# Patient Record
Sex: Male | Born: 1945 | Race: White | Hispanic: No | Marital: Married | Smoking: Never smoker
Health system: Southern US, Community
[De-identification: ages and names within clinical notes are randomized; demographics above are authoritative.]

## PROBLEM LIST (undated history)

## (undated) DIAGNOSIS — Z9889 Other specified postprocedural states: Secondary | ICD-10-CM

## (undated) DIAGNOSIS — F32A Depression, unspecified: Secondary | ICD-10-CM

## (undated) DIAGNOSIS — H8110 Benign paroxysmal vertigo, unspecified ear: Secondary | ICD-10-CM

## (undated) DIAGNOSIS — K219 Gastro-esophageal reflux disease without esophagitis: Secondary | ICD-10-CM

## (undated) DIAGNOSIS — G709 Myoneural disorder, unspecified: Secondary | ICD-10-CM

## (undated) DIAGNOSIS — M1990 Unspecified osteoarthritis, unspecified site: Secondary | ICD-10-CM

## (undated) DIAGNOSIS — F410 Panic disorder [episodic paroxysmal anxiety] without agoraphobia: Secondary | ICD-10-CM

## (undated) DIAGNOSIS — F419 Anxiety disorder, unspecified: Secondary | ICD-10-CM

## (undated) DIAGNOSIS — F329 Major depressive disorder, single episode, unspecified: Secondary | ICD-10-CM

## (undated) DIAGNOSIS — R112 Nausea with vomiting, unspecified: Secondary | ICD-10-CM

## (undated) HISTORY — DX: Anxiety disorder, unspecified: F41.9

## (undated) HISTORY — PX: VASECTOMY: SHX75

## (undated) HISTORY — DX: Benign paroxysmal vertigo, unspecified ear: H81.10

## (undated) HISTORY — PX: COLONOSCOPY: SHX174

## (undated) HISTORY — DX: Panic disorder (episodic paroxysmal anxiety): F41.0

## (undated) HISTORY — DX: Unspecified osteoarthritis, unspecified site: M19.90

## (undated) HISTORY — PX: KNEE SURGERY: SHX244

---

## 1967-12-27 HISTORY — PX: APPENDECTOMY: SHX54

## 1983-12-27 HISTORY — PX: OTHER SURGICAL HISTORY: SHX169

## 1988-12-26 HISTORY — PX: OTHER SURGICAL HISTORY: SHX169

## 1991-12-27 HISTORY — PX: ANAL FISSURE REPAIR: SHX2312

## 2007-12-27 HISTORY — PX: BACK SURGERY: SHX140

## 2009-12-26 HISTORY — PX: BACK SURGERY: SHX140

## 2009-12-26 HISTORY — PX: TOTAL KNEE ARTHROPLASTY: SHX125

## 2010-12-26 HISTORY — PX: OTHER SURGICAL HISTORY: SHX169

## 2011-01-26 HISTORY — PX: TOTAL KNEE ARTHROPLASTY: SHX125

## 2012-10-24 ENCOUNTER — Other Ambulatory Visit: Payer: Self-pay | Admitting: Orthopedic Surgery

## 2012-10-24 DIAGNOSIS — M25511 Pain in right shoulder: Secondary | ICD-10-CM

## 2012-10-24 DIAGNOSIS — R531 Weakness: Secondary | ICD-10-CM

## 2012-10-31 ENCOUNTER — Ambulatory Visit
Admission: RE | Admit: 2012-10-31 | Discharge: 2012-10-31 | Disposition: A | Discharge status: Home / Self Care | Payer: Medicare Other | Source: Ambulatory Visit | Attending: Orthopedic Surgery | Admitting: Orthopedic Surgery

## 2012-10-31 DIAGNOSIS — R531 Weakness: Secondary | ICD-10-CM

## 2012-10-31 DIAGNOSIS — M25511 Pain in right shoulder: Secondary | ICD-10-CM

## 2012-11-29 ENCOUNTER — Other Ambulatory Visit: Payer: Self-pay | Admitting: Orthopedic Surgery

## 2012-11-29 DIAGNOSIS — M542 Cervicalgia: Secondary | ICD-10-CM

## 2012-11-29 DIAGNOSIS — M25511 Pain in right shoulder: Secondary | ICD-10-CM

## 2012-12-03 ENCOUNTER — Ambulatory Visit
Admission: RE | Admit: 2012-12-03 | Discharge: 2012-12-03 | Disposition: A | Discharge status: Home / Self Care | Payer: Medicare Other | Source: Ambulatory Visit | Attending: Orthopedic Surgery | Admitting: Orthopedic Surgery

## 2012-12-03 DIAGNOSIS — M542 Cervicalgia: Secondary | ICD-10-CM

## 2012-12-03 DIAGNOSIS — M25511 Pain in right shoulder: Secondary | ICD-10-CM

## 2012-12-18 ENCOUNTER — Encounter: Payer: Self-pay | Admitting: Sports Medicine

## 2012-12-18 ENCOUNTER — Ambulatory Visit (INDEPENDENT_AMBULATORY_CARE_PROVIDER_SITE_OTHER): Payer: Medicare Other | Admitting: Sports Medicine

## 2012-12-18 VITALS — BP 134/86 | HR 89 | Ht 68.0 in | Wt 220.0 lb

## 2012-12-18 DIAGNOSIS — M79609 Pain in unspecified limb: Secondary | ICD-10-CM

## 2012-12-18 DIAGNOSIS — M79671 Pain in right foot: Secondary | ICD-10-CM | POA: Insufficient documentation

## 2012-12-18 NOTE — Assessment & Plan Note (Signed)
See instructions  HEP and stretches Arch strap Heel cup Cont gel lifts  Try these for 6 to 8 weeks and let's recheck at that time  Lots of other orthopedic issues contribute

## 2012-12-18 NOTE — Progress Notes (Signed)
Patient ID: George Freeman, male   DOB: 02-29-1946, 66 y.o.   MRN: [id number]  Rt heel pain x 6 months, worse x 2 months.  Worst in the mornings, and with increased activity. Has not been able to do exercise for exercise due to heel pain.  Has padded rt shoes with OTC orthotic and heel pad Not a clear trigger for this pain He used to like to walk and hike with wife Unable to do this for past 6 mos.  Hx of multiple orthopedic surgeries- bilat shoulders, bilat knee replacements, lt foot, back TKR have worked well Current sxs from cervical spine to RT arm Also some numbness to left foot   Physical exam: Mildly obese W male in NAD  Hallux rigidus rt great toe- 15 deg extension, 10 deg flexion Mild hallus rigidus lt great toe- 30 deg extension ttp of heel at insertion of PF Midline scar from biklat knees from replacements Rt leg- 85 cm  Lt leg 87 cm  Walking gait- back shifting -Thoracic scoliosis curve Walks with torso shifted to RT but this evens his leg length functionally  MSK [place] RT PF is 0.56 vs LT PF of 0.47 There is some hypoechoic change over calcaneal fat pad on RT Mild calcification over os calcis No PF tears   Rt Ankle: No visible erythema or swelling. Range of motion is full in all directions. Strength is 5/5 in all directions. Stable lateral and medial ligaments; squeeze test and kleiger test unremarkable; Talar dome nontender; No pain at base of 5th MT; No tenderness over cuboid; No tenderness over N spot or navicular prominence No tenderness on posterior aspects of lateral and medial malleolus No sign of peroneal tendon subluxations; Negative tarsal tunnel tinel's Able to walk 4 steps.

## 2013-01-26 HISTORY — PX: CERVICAL SPINE SURGERY: SHX589

## 2013-02-12 ENCOUNTER — Ambulatory Visit (INDEPENDENT_AMBULATORY_CARE_PROVIDER_SITE_OTHER): Payer: Medicare HMO | Admitting: Sports Medicine

## 2013-02-12 VITALS — BP 123/80 | Ht 68.0 in | Wt 220.0 lb

## 2013-02-12 DIAGNOSIS — M79671 Pain in right foot: Secondary | ICD-10-CM

## 2013-02-12 DIAGNOSIS — M79609 Pain in unspecified limb: Secondary | ICD-10-CM

## 2013-02-12 DIAGNOSIS — F4001 Agoraphobia with panic disorder: Secondary | ICD-10-CM

## 2013-02-12 MED ORDER — SIMVASTATIN 40 MG PO TABS: 40.0000 mg | ORAL_TABLET | Freq: Every evening | ORAL | Status: AC

## 2013-02-12 MED ORDER — ALPRAZOLAM 0.25 MG PO TABS: 0.2500 mg | ORAL_TABLET | Freq: Four times a day (QID) | ORAL | Status: AC

## 2013-02-12 MED ORDER — FLUVOXAMINE MALEATE 100 MG PO TABS: 100.0000 mg | ORAL_TABLET | Freq: Every day | ORAL | Status: AC

## 2013-02-12 NOTE — Assessment & Plan Note (Signed)
This is significantly improved I think the heel cups of help because a lot of his trauma was over the os calcis Continue the plantar fashion stretches those seem to be helping great deal His walking is improved as he is no longer walking on the outside of his foot and that has also lessens his stress on the plantar fascia  Will recheck this when necessary or if it's not improved within 3 months

## 2013-02-12 NOTE — Progress Notes (Signed)
Patient ID: Clemens Lachman, male   DOB: 1946-01-13, 67 y.o.   MRN: [id number]  Patient who is back for f/u of plantar fasciitis and RT heel pain He did the exercises, used the heel cups He added 2 more stretches He notes heel pain is 75% better  Surgery on neck 5 days ago Having to use Hydrocodone for that but had gotten down to 1 a day presurgery  Multiple orthopedic surgeries and knee replacements (12 total) - see last hx  Panic disorder He takes Xanax 0.25 mg 4 times a day He uses Luvox 100 daily Other medication is simvastatin 40 mg daily He has been using hydrocodone to recover from orthopedic surgery  Physical examination Patient seems in some mild pain primarily on movement of the back he has a healing surgical scar just below the neck on the right side  Examination of the right foot shows no tenderness now over the heel He still has a significant limitation of motion of the right great toe His longitudinal arch is slightly flattened No swelling or other abnormality

## 2013-02-12 NOTE — Assessment & Plan Note (Signed)
We discussed that this is related a lot of the time to symptoms around his musculoskeletal system  I advised him to start his exercise program very gradually He should expect to have some pain over some joint disease had bilateral knee replacements as well as a lot of other surgical procedures  Use cognitive behavioral therapy to lessen the degree of anxiety related to these

## 2013-11-12 ENCOUNTER — Encounter (HOSPITAL_COMMUNITY): Payer: Medicare HMO

## 2013-11-20 ENCOUNTER — Other Ambulatory Visit (HOSPITAL_COMMUNITY): Payer: Self-pay | Admitting: Family Medicine

## 2013-11-20 ENCOUNTER — Ambulatory Visit (HOSPITAL_COMMUNITY)
Admission: RE | Admit: 2013-11-20 | Discharge: 2013-11-20 | Disposition: A | Discharge status: Home / Self Care | Payer: Medicare HMO | Source: Ambulatory Visit | Attending: Family Medicine | Admitting: Family Medicine

## 2013-11-20 DIAGNOSIS — G609 Hereditary and idiopathic neuropathy, unspecified: Secondary | ICD-10-CM | POA: Insufficient documentation

## 2013-11-20 DIAGNOSIS — R209 Unspecified disturbances of skin sensation: Secondary | ICD-10-CM | POA: Insufficient documentation

## 2013-11-20 NOTE — Progress Notes (Signed)
VASCULAR LAB PRELIMINARY  ABI completed:    RIGHT    LEFT    PRESSURE WAVEFORM  PRESSURE WAVEFORM  BRACHIAL 137 Tri BRACHIAL 123 Tri  DP   DP    AT 151 Tri AT 149 Tri  PT 152 Tri PT 154 Tri  PER   PER    GREAT TOE  NA GREAT TOE  NA    RIGHT LEFT  ABI 1.11 1.12     Farrel Demark, RDMS, RVT  11/20/2013, 10:30 AM

## 2013-11-25 HISTORY — PX: BACK SURGERY: SHX140

## 2014-09-23 ENCOUNTER — Other Ambulatory Visit: Payer: Self-pay | Admitting: Orthopedic Surgery

## 2014-09-23 DIAGNOSIS — M542 Cervicalgia: Secondary | ICD-10-CM

## 2014-09-26 ENCOUNTER — Ambulatory Visit
Admission: RE | Admit: 2014-09-26 | Discharge: 2014-09-26 | Disposition: A | Discharge status: Home / Self Care | Payer: Medicare HMO | Source: Ambulatory Visit | Attending: Orthopedic Surgery | Admitting: Orthopedic Surgery

## 2014-09-26 DIAGNOSIS — M542 Cervicalgia: Secondary | ICD-10-CM

## 2014-09-27 ENCOUNTER — Other Ambulatory Visit: Payer: Medicare HMO

## 2015-03-23 ENCOUNTER — Ambulatory Visit: Payer: Medicare HMO | Admitting: Neurology

## 2015-03-24 ENCOUNTER — Ambulatory Visit (INDEPENDENT_AMBULATORY_CARE_PROVIDER_SITE_OTHER): Payer: Medicare PPO | Admitting: Neurology

## 2015-03-24 ENCOUNTER — Encounter: Payer: Self-pay | Admitting: Neurology

## 2015-03-24 VITALS — BP 119/72 | HR 85 | Temp 97.4°F | Ht 68.0 in | Wt 208.5 lb

## 2015-03-24 DIAGNOSIS — R5382 Chronic fatigue, unspecified: Secondary | ICD-10-CM

## 2015-03-24 DIAGNOSIS — G609 Hereditary and idiopathic neuropathy, unspecified: Secondary | ICD-10-CM

## 2015-03-24 DIAGNOSIS — E539 Vitamin B deficiency, unspecified: Secondary | ICD-10-CM

## 2015-03-24 DIAGNOSIS — R7302 Impaired glucose tolerance (oral): Secondary | ICD-10-CM

## 2015-03-24 NOTE — Progress Notes (Signed)
[facility]    Provider:  Dr Lucia Gaskins Referring Provider: Tracey Harries, MD Primary Care Physician:  Aura Dials, MD  CC:  neuropathy  HPI:  George Freeman is a 69 y.o. male here as a referral from Dr. Everlene Other for neuropathy  Symptoms started 5 years ago. Both feet symmetric, numbness, get cold at night. Cramps at night in the toes if he moves his toes. Feels like something is on the bottom of the feet when walking. Worse at night in bed. He can't feel them. No pain. He had vascular testing and normal. Always there, nothing makes it better. No pain at all. Slowly progressive. Symptoms in the anterior part of the feet. He has a tendency to drop things, no sensory changes in the hands. But hands tend to be colder. No weakness.   Review of Systems: Patient complains of symptoms per HPI as well as the following symptoms: anxiety, restless legs. Pertinent negatives per HPI. All others negative.   History   Social History  . Marital Status: Married    Spouse Name: N/A  . Number of Children: 2  . Years of Education: PHD   Occupational History  . Retired    Social History Main Topics  . Smoking status: Never Smoker   . Smokeless tobacco: Not on file  . Alcohol Use: No  . Drug Use: No  . Sexual Activity: Not on file   Other Topics Concern  . Not on file   Social History Narrative   Lives at home with wife.    Caffeine use: very little     Family History  Problem Relation Age of Onset  . Diabetes Mother   . Alzheimer's disease Sister   . Acute myelogenous leukemia    . Neuropathy Neg Hx     Past Medical History  Diagnosis Date  . BPV (benign positional vertigo)   . Panic disorder   . Arthritis   . Anxiety     Past Surgical History  Procedure Laterality Date  . Appendectomy  1969  . Knee surgery      2x- cartilage  . Total knee arthroplasty  January 2011  . Total knee arthroplasty  Feb. 2012  . Vasectomy    . Anal fissure repair  1993  .  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 Back surgery  2011    Stenosis, sciatica  . Left shoulder surgery  1990  . Right shoulder surgery  2012  . Left foot surgery  1985  . Cervical spine surgery  Feb. 2014  . Back surgery  December 2014    Current Outpatient Prescriptions  Medication Sig Dispense Refill  . fluvoxaMINE (LUVOX) 100 MG tablet Take 1 tablet (100 mg total) by mouth at bedtime. (Patient taking differently: Take 150 mg by mouth at bedtime. )  3  . HYDROcodone-acetaminophen (NORCO) 10-325 MG per tablet Take 1 tablet by mouth as needed.    Marland Kitchen ibuprofen (ADVIL,MOTRIN) 600 MG tablet Take 600 mg by mouth.    Marland Kitchen omeprazole (PRILOSEC) 20 MG capsule Take 20 mg by mouth 2 (two) times daily.    . Probiotic Product (ALIGN PO) Take 3 tablets by mouth daily.    . psyllium (REGULOID) 0.52 G capsule Take 1 capsule by mouth daily.    . simvastatin (ZOCOR) 40 MG tablet Take 1 tablet (40 mg total) by mouth every evening. 30 tablet 3  . Tamsulosin HCl (FLOMAX) 0.4 MG CAPS Take 0.4 mg  by mouth daily.    Marland Kitchen. terbinafine (LAMISIL) 250 MG tablet Take 250 mg by mouth.    . topiramate (TOPAMAX) 50 MG tablet Take 50 mg by mouth.    . ALPRAZolam (XANAX) 0.25 MG tablet Take 1 tablet (0.25 mg total) by mouth 4 (four) times daily. (Patient not taking: Reported on 03/24/2015) 120 tablet 0  . cyclobenzaprine (FLEXERIL) 10 MG tablet Take 10 mg by mouth as needed.    . fluorouracil (EFUDEX) 5 % cream     . tadalafil (CIALIS) 5 MG tablet Take 5 mg by mouth.     No current facility-administered medications for this visit.    Allergies as of 03/24/2015 - Review Complete 02/12/2013  Allergen Reaction Noted  . Ciprofloxacin  03/24/2015  . Neurontin [gabapentin]  03/24/2015  . Pregabalin  03/24/2015    Vitals: BP 119/72 mmHg  Pulse 85  Temp(Src) 97.4 F (36.3 C)  Ht 5\' 8"  (1.727 m)  Wt 208 lb 8 oz (94.575 kg)  BMI 31.71 kg/m2 Last Weight:  Wt Readings from Last 1 Encounters:  03/24/15 208 lb 8 oz  (94.575 kg)   Last Height:   Ht Readings from Last 1 Encounters:  03/24/15 5\' 8"  (1.727 m)     Physical exam: Exam: Gen: NAD, conversant, well nourised, +well groomed                     CV: RRR, no MRG. No Carotid Bruits. No peripheral edema, warm, nontender Eyes: Conjunctivae clear without exudates or hemorrhage  Neuro: Detailed Neurologic Exam  Speech:    Speech is normal; fluent and spontaneous with normal comprehension.  Cognition:    The patient is oriented to person, place, and time;     recent and remote memory intact;     language fluent;     normal attention, concentration,     fund of knowledge Cranial Nerves:    The pupils are equal, round, and reactive to light. The fundi flat. Visual fields are full to finger confrontation. Extraocular movements are intact. Trigeminal sensation is intact and the muscles of mastication are normal. The face is symmetric. The palate elevates in the midline. Hearing intact. Voice is normal. Shoulder shrug is normal. The tongue has normal motion without fasciculations.   Coordination:    Normal finger to nose and heel to shin. Normal rapid alternating movements.   Gait:    Heel-toe intact. Difficulty with Tandem.   Motor Observation:    No asymmetry, no atrophy, and no involuntary movements noted. Tone:    Normal muscle tone.    Posture:    Posture is normal. normal erect    Strength:    Strength is V/V in the upper and lower limbs.      Sensation: decrease to pp and temp to the ankles. 2-3 sec vibration at the great toes. Decrease proprioception at the great toes/      Reflex Exam:  DTR's:    Deep tendon reflexes in the upper and lower extremities are normal bilaterally.   Toes:    The toes are downgoing bilaterally.   Clonus:    Clonus is absent.     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  Serum neuropathy workup  Naomie DeanAntonia Ahern, MD  [facility] [address] [place], [place] [id number]  Phone [phone] Fax [phone]

## 2015-03-24 NOTE — Patient Instructions (Signed)
Overall you are doing fairly well but I do want to suggest a few things today:   Remember to drink plenty of fluid, eat healthy meals and do not skip any meals. Try to eat protein with a every meal and eat a healthy snack such as fruit or nuts in between meals. Try to keep a regular sleep-wake schedule and try to exercise daily, particularly in the form of walking, 20-30 minutes a day, if you can.   As far as diagnostic testing: labs  I would like to see you back in 6 months or as needed, sooner if we need to. Please call us with any interim questions, concerns, problems, updates or refill requests.   Please also call us for any test results so we can go over those with you on the phone.  My clinical assistant and will answer any of your questions and relay your messages to me and also relay most of my messages to you.   Our phone number is [phone]. We also have an after hours call service for urgent matters and there is a physician on-call for urgent questions. For any emergencies you know to call 911 or go to the nearest emergency room

## 2015-03-31 ENCOUNTER — Telehealth: Payer: Self-pay | Admitting: *Deleted

## 2015-03-31 NOTE — Telephone Encounter (Signed)
Talked with pt about normal lab results. Pt verbalized understanding.  

## 2015-04-02 LAB — MULTIPLE MYELOMA PANEL, SERUM
ALBUMIN SERPL ELPH-MCNC: 4.1 g/dL (ref 3.2–5.6)
ALBUMIN/GLOB SERPL: 1.6 (ref 0.7–2.0)
Alpha 1: 0.2 g/dL (ref 0.1–0.4)
Alpha2 Glob SerPl Elph-Mcnc: 0.6 g/dL (ref 0.4–1.2)
B-Globulin SerPl Elph-Mcnc: 1.1 g/dL (ref 0.6–1.3)
Gamma Glob SerPl Elph-Mcnc: 0.8 g/dL (ref 0.5–1.6)
Globulin, Total: 2.7 g/dL (ref 2.0–4.5)
IGG (IMMUNOGLOBIN G), SERUM: 931 mg/dL (ref 700–1600)
IgA/Immunoglobulin A, Serum: 117 mg/dL (ref 61–437)
IgM (Immunoglobulin M), Srm: <6 mg/dL — ABNORMAL LOW (ref 20–172)
Total Protein: 6.8 g/dL (ref 6.0–8.5)

## 2015-04-02 LAB — THYROID PANEL WITH TSH
FREE THYROXINE INDEX: 1.9 (ref 1.2–4.9)
T3 UPTAKE RATIO: 28 % (ref 24–39)
T4 TOTAL: 6.8 ug/dL (ref 4.5–12.0)
TSH: 1.23 u[IU]/mL (ref 0.450–4.500)

## 2015-04-02 LAB — PAN-ANCA
ANCA Proteinase 3: <3.5 U/mL (ref 0.0–3.5)
Myeloperoxidase Ab: <9 U/mL (ref 0.0–9.0)

## 2015-04-02 LAB — B12 AND FOLATE PANEL
Folate: 17.6 ng/mL (ref >3.0)
VITAMIN B 12: 410 pg/mL (ref 211–946)

## 2015-04-02 LAB — VITAMIN B6: Vitamin B6: 10.4 ug/L (ref 5.3–46.7)

## 2015-04-02 LAB — HEMOGLOBIN A1C
Est. average glucose Bld gHb Est-mCnc: 100 mg/dL
HEMOGLOBIN A1C: 5.1 % (ref 4.8–5.6)

## 2015-04-02 LAB — RPR: RPR Ser Ql: NONREACTIVE

## 2015-04-02 LAB — RHEUMATOID FACTOR

## 2015-04-02 LAB — HEAVY METALS, BLOOD
ARSENIC: 8 ug/L (ref 2–23)
Lead, Blood: NOT DETECTED ug/dL (ref 0–19)
MERCURY: NOT DETECTED ug/L (ref 0.0–14.9)

## 2015-04-02 LAB — ANGIOTENSIN CONVERTING ENZYME: ANGIO CONVERT ENZYME: 57 U/L (ref 14–82)

## 2015-04-02 LAB — VITAMIN B1: THIAMINE: 150 nmol/L (ref 66.5–200.0)

## 2015-04-02 LAB — PARANEOPLASTIC PROFILE 1
Neuronal Nuc Ab (Ri), IFA: <1:10 {titer}
Neuronal Nuclear (Hu) Antibody (IB): <1:10 {titer}
Purkinje Cell (Yo) Autoantobodies- IFA: <1:10 {titer}

## 2015-04-02 LAB — ANA W/REFLEX: Anti Nuclear Antibody(ANA): NEGATIVE

## 2015-04-02 LAB — HEPATITIS C ANTIBODY: Hep C Virus Ab: <0.1 s/co ratio (ref 0.0–0.9)

## 2015-04-03 ENCOUNTER — Telehealth: Payer: Self-pay | Admitting: *Deleted

## 2015-04-03 NOTE — Telephone Encounter (Signed)
Talked with pt about normal lab results. Pt verbalized understanding.  

## 2015-07-23 HISTORY — PX: CERVICAL SPINE SURGERY: SHX589

## 2015-09-30 ENCOUNTER — Encounter: Payer: Self-pay | Admitting: Nurse Practitioner

## 2015-09-30 ENCOUNTER — Ambulatory Visit (INDEPENDENT_AMBULATORY_CARE_PROVIDER_SITE_OTHER): Payer: Medicare PPO | Admitting: Nurse Practitioner

## 2015-09-30 VITALS — BP 143/82 | HR 93 | Ht 68.0 in | Wt 218.4 lb

## 2015-09-30 DIAGNOSIS — G609 Hereditary and idiopathic neuropathy, unspecified: Secondary | ICD-10-CM

## 2015-09-30 NOTE — Progress Notes (Signed)
I reviewed above note and agree with the assessment and plan.  Marvel Plan, MD PhD Stroke Neurology 09/30/2015 6:53 PM

## 2015-09-30 NOTE — Patient Instructions (Signed)
Neuropathy symptoms stable Given information on neuropathy F/U yearly and prn next with Dr. Lucia Gaskins

## 2015-09-30 NOTE — Progress Notes (Signed)
[facility]  PATIENT: George Freeman DOB: March 22, 1946   REASON FOR VISIT: Hereditary idiopathic peripheral neuropathy,  HISTORY FROM: Patient    HISTORY OF PRESENT ILLNESS: George Freeman, 69 year old male returns for follow-up. He was initially evaluated 03/24/2015 by Dr. Lucia Gaskins for neuropathy. He returns for follow-up visit his symptoms have not progressed. He still feels like there is something on the bottom of his feet when walking. He occasionally has cramping of his toes at night he denies any pain associated with this. He denies any problems in his hands. He had cervical fusion 07/23/2015 at [facility]. He is here for reevaluation  HISTORY: 03/24/15 Dr. Leonia Corona is a 69 y.o. male here as a referral from Dr. Everlene Other for neuropathy Symptoms started 5 years ago. Both feet symmetric, numbness, get cold at night. Cramps at night in the toes if he moves his toes. Feels like something is on the bottom of the feet when walking. Worse at night in bed. He can't feel them. No pain. He had vascular testing and normal. Always there, nothing makes it better. No pain at all. Slowly progressive. Symptoms in the anterior part of the feet. He has a tendency to drop things, no sensory changes in the hands. But hands tend to be colder. No weakness.     REVIEW OF SYSTEMS: Full 14 system review of systems performed and notable only for those listed, all others are neg:  Constitutional: neg  Cardiovascular: neg Ear/Nose/Throat: Hearing loss Skin: neg Eyes: neg Respiratory: neg Gastroitestinal: neg  Hematology/Lymphatic: neg  Endocrine: neg Musculoskeletal: Joint pain Allergy/Immunology: neg Neurological: Numbness in the feet Psychiatric: neg Sleep : neg   ALLERGIES: Allergies  Allergen Reactions  . Ciprofloxacin   . Neurontin [Gabapentin]   . Pregabalin     HOME MEDICATIONS: Outpatient Prescriptions Prior to Visit  Medication Sig Dispense Refill  .  ALPRAZolam (XANAX) 0.25 MG tablet Take 1 tablet (0.25 mg total) by mouth 4 (four) times daily. (Patient taking differently: Take 0.25 mg by mouth 4 (four) times daily as needed. ) 120 tablet 0  . fluvoxaMINE (LUVOX) 100 MG tablet Take 1 tablet (100 mg total) by mouth at bedtime. (Patient taking differently: Take 150 mg by mouth at bedtime. )  3  . ibuprofen (ADVIL,MOTRIN) 600 MG tablet Take 600 mg by mouth as needed.     . Probiotic Product (ALIGN PO) Take 3 tablets by mouth daily.    . psyllium (REGULOID) 0.52 G capsule Take 3 capsules by mouth daily.     . simvastatin (ZOCOR) 40 MG tablet Take 1 tablet (40 mg total) by mouth every evening. 30 tablet 3  . Tamsulosin HCl (FLOMAX) 0.4 MG CAPS Take 0.4 mg by mouth daily.    . tadalafil (CIALIS) 5 MG tablet Take 5 mg by mouth.    . cyclobenzaprine (FLEXERIL) 10 MG tablet Take 10 mg by mouth as needed.    . fluorouracil (EFUDEX) 5 % cream     . HYDROcodone-acetaminophen (NORCO) 10-325 MG per tablet Take 1 tablet by mouth as needed.    Marland Kitchen omeprazole (PRILOSEC) 20 MG capsule Take 20 mg by mouth 2 (two) times daily.    Marland Kitchen topiramate (TOPAMAX) 50 MG tablet Take 50 mg by mouth.     No facility-administered medications prior to visit.    PAST MEDICAL HISTORY: Past Medical History  Diagnosis Date  . BPV (benign positional vertigo)   . Panic disorder   . Arthritis   . Anxiety  PAST SURGICAL HISTORY: Past Surgical History  Procedure Laterality Date  . Appendectomy  1969  . Knee surgery      2x- cartilage  . Total knee arthroplasty  January 2011  . Total knee arthroplasty  Feb. 2012  . Vasectomy    . Anal fissure repair  1993  . 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 Back surgery  2011    Stenosis, sciatica  . Left shoulder surgery  1990  . Right shoulder surgery  2012  . Left foot surgery  1985  . Cervical spine surgery  Feb. 2014  . Back surgery  December 2014  . Cervical spine surgery  07/23/15    fusion    FAMILY  HISTORY: Family History  Problem Relation Age of Onset  . Diabetes Mother   . Alzheimer's disease Sister   . Acute myelogenous leukemia    . Neuropathy Neg Hx     SOCIAL HISTORY: Social History   Social History  . Marital Status: Married    Spouse Name: N/A  . Number of Children: 2  . Years of Education: PHD   Occupational History  . Retired    Social History Main Topics  . Smoking status: Never Smoker   . Smokeless tobacco: Not on file  . Alcohol Use: No  . Drug Use: No  . Sexual Activity: Not on file   Other Topics Concern  . Not on file   Social History Narrative   Lives at home with wife.    Caffeine use: very little      PHYSICAL EXAM  Filed Vitals:   09/30/15 1031  BP: 143/82  Pulse: 93  Height:  (1.727 m)  Weight: 218 lb 6.4 oz (99.066 kg)   Body mass index is 33.22 kg/(m^2).  Generalized: Well developed, in no acute distress  Head: normocephalic and atraumatic,. Oropharynx benign  Neck: Supple, no carotid bruits  Cardiac: Regular rate rhythm, no murmur  Musculoskeletal: No deformity   Neurological examination   Mentation: Alert oriented to time, place, history taking. Attention span and concentration appropriate. Recent and remote memory intact.  Follows all commands speech and language fluent.   Cranial nerve II-XII: Pupils were equal round reactive to light extraocular movements were full, visual field were full on confrontational test. Facial sensation and strength were normal. hearing was intact to finger rubbing bilaterally. Uvula tongue midline. head turning and shoulder shrug were normal and symmetric.Tongue protrusion into cheek strength was normal. Motor: normal bulk and tone, full strength in the BUE, BLE, fine finger movements normal, no pronator drift. No focal weakness Sensory: Decreased pinprick and temperature to the ankles, decreased vibratory to the toes, proprioception normal   Coordination: finger-nose-finger, heel-to-shin  bilaterally, no dysmetria Reflexes: Brachioradialis 2/2, biceps 2/2, triceps 2/2, patellar 2/2, Achilles 2/2, plantar responses were flexor bilaterally. Gait and Station: Rising up from seated position without assistance, normal stance,  moderate stride, good arm swing, smooth turning, able to perform tiptoe, and heel walking without difficulty. Tandem gait is unsteady  DIAGNOSTIC DATA (LABS, IMAGING, TESTING) - I reviewed patient records, labs, notes, testing and imaging myself where available.      Component Value Date/Time   PROT 6.8 03/24/2015 0951    Lab Results  Component Value Date   HGBA1C 5.1 03/24/2015   Lab Results  Component Value Date   VITAMINB12 410 03/24/2015   Lab Results  Component Value Date   TSH 1.230 03/24/2015  ASSESSMENT AND PLAN  69 y.o. year old male  has a past medical history of peripheral neuropathy both small and large fiber involvement. here to followup. The patient is a current patient of Dr. Lucia Gaskins  who is out of the office today . This note is sent to the work in doctor.     Neuropathy symptoms are stable Given information on peripheral neuropathy, reviewed with the patient and answered questions.  F/U yearly and prn next with Dr. Lucia Gaskins Call if pain or weakness occurs Vst time 18 min Nilda Riggs, [facility], [facility], APRN  [facility] [address], [address] [place], [place] [address] [phone]

## 2015-11-13 ENCOUNTER — Other Ambulatory Visit: Payer: Self-pay | Admitting: Orthopedic Surgery

## 2015-11-13 DIAGNOSIS — M25511 Pain in right shoulder: Secondary | ICD-10-CM

## 2015-11-29 ENCOUNTER — Ambulatory Visit
Admission: RE | Admit: 2015-11-29 | Discharge: 2015-11-29 | Disposition: A | Discharge status: Home / Self Care | Payer: Medicare HMO | Source: Ambulatory Visit | Attending: Orthopedic Surgery | Admitting: Orthopedic Surgery

## 2015-11-29 DIAGNOSIS — M25511 Pain in right shoulder: Secondary | ICD-10-CM

## 2015-12-01 ENCOUNTER — Other Ambulatory Visit: Payer: Medicare HMO

## 2016-01-08 ENCOUNTER — Other Ambulatory Visit: Payer: Self-pay | Admitting: Orthopedic Surgery

## 2016-01-08 DIAGNOSIS — M25511 Pain in right shoulder: Secondary | ICD-10-CM

## 2016-01-20 ENCOUNTER — Ambulatory Visit
Admission: RE | Admit: 2016-01-20 | Discharge: 2016-01-20 | Disposition: A | Discharge status: Home / Self Care | Payer: PRIVATE HEALTH INSURANCE | Source: Ambulatory Visit | Attending: Orthopedic Surgery | Admitting: Orthopedic Surgery

## 2016-01-20 DIAGNOSIS — M25511 Pain in right shoulder: Secondary | ICD-10-CM

## 2016-02-19 ENCOUNTER — Other Ambulatory Visit: Payer: Self-pay | Admitting: Otolaryngology

## 2016-02-19 DIAGNOSIS — IMO0002 Reserved for concepts with insufficient information to code with codable children: Secondary | ICD-10-CM

## 2016-02-29 ENCOUNTER — Other Ambulatory Visit: Payer: PRIVATE HEALTH INSURANCE

## 2016-03-03 ENCOUNTER — Ambulatory Visit (INDEPENDENT_AMBULATORY_CARE_PROVIDER_SITE_OTHER): Payer: Medicare Other | Admitting: Neurology

## 2016-03-03 VITALS — BP 142/70 | HR 89 | Ht 68.0 in | Wt 218.8 lb

## 2016-03-03 DIAGNOSIS — H8110 Benign paroxysmal vertigo, unspecified ear: Secondary | ICD-10-CM

## 2016-03-03 DIAGNOSIS — G629 Polyneuropathy, unspecified: Secondary | ICD-10-CM | POA: Diagnosis not present

## 2016-03-03 NOTE — Progress Notes (Signed)
[facility]    Provider:  Dr Lucia Gaskins Referring Provider: Tracey Harries, MD Primary Care Physician:  Aura Dials, MD  CC: neuropathy  Interval history 03/03/2016: Patient here for follow up on idiopathic peripheral neuropathy. 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  The vertigo has changed, it is more often, Epley maneuvers have helped. The symptoms are different, onset wasn't as dramatic this time but more persistent. It did resolve after the Epley Maneuver. Worse with positioning head back or in bed. He has room spinning. He had a +Dix Hallpike on exam. On testing he would also become symptomatic when placing his head back and to the right. An MRI was ordered but he did not have it completed.  Reviewed the labs for his neuropathy which were all unremarkable including ACE, ANA, B12 and folate, Pan-ANCA, RPR, Hep C, RF, Heavy metals, Vitamin B6, IFE, B1, Hgba1c, TSH. No history of alcohol use. Reviewed an extensive list of drugs that can cause neuropathies for any risk factors: No long term antibiotics, no chemotherapeutic drugs, No cardiovascular drugs such as amiodarone or others, no rheumatologic drugs like colchicine or allopurinol, he was on lithium for 2 years which can cause sensorimotor changes, [place] but unknown exposure to toxins. Neuropathy is stable. No pain.   HPI: Stevon Gough is a 70 y.o. male here as a referral from Dr. Everlene Other for neuropathy. PMHx BPPV, Panic disorder, Arthritis, anxiety.   Symptoms started 5 years ago. Both feet symmetric, numbness, get cold at night. Cramps at night in the toes if he moves his toes. Feels like something is on the bottom of the feet when walking. Worse at night in bed. He can't feel them. No pain. He had vascular testing and normal. Always there, nothing makes it better. No pain at all. Slowly progressive. Symptoms in the anterior part of the feet. He has a tendency to drop things, no sensory changes in the  hands. But hands tend to be colder. No weakness.   Review of Systems: Patient complains of symptoms per HPI as well as the following symptoms: anxiety, restless legs. Pertinent negatives per HPI. All others negative.    Social History   Social History  . Marital Status: Married    Spouse Name: N/A  . Number of Children: 2  . Years of Education: PHD   Occupational History  . Retired    Social History Main Topics  . Smoking status: Never Smoker   . Smokeless tobacco: Not on file  . Alcohol Use: No  . Drug Use: No  . Sexual Activity: Not on file   Other Topics Concern  . Not on file   Social History Narrative   Lives at home with wife.    Caffeine use: very little     Family History  Problem Relation Age of Onset  . Diabetes Mother   . Alzheimer's disease Sister   . Acute myelogenous leukemia    . Neuropathy Neg Hx     Past Medical History  Diagnosis Date  . BPV (benign positional vertigo)   . Panic disorder   . Arthritis   . Anxiety     Past Surgical History  Procedure Laterality Date  . Appendectomy  1969  . Knee surgery      2x- cartilage  . Total knee arthroplasty  January 2011  . Total knee arthroplasty  Feb. 2012  . Vasectomy    . Anal fissure repair  1993  .  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 Back surgery  2011    Stenosis, sciatica  . Left shoulder surgery  1990  . Right shoulder surgery  2012  . Left foot surgery  1985  . Cervical spine surgery  Feb. 2014  . Back surgery  December 2014  . Cervical spine surgery  07/23/15    fusion    Current Outpatient Prescriptions  Medication Sig Dispense Refill  . ALPRAZolam (XANAX) 0.25 MG tablet Take 1 tablet (0.25 mg total) by mouth 4 (four) times daily. (Patient taking differently: Take 0.25 mg by mouth 4 (four) times daily as needed. ) 120 tablet 0  . fluvoxaMINE (LUVOX) 100 MG tablet Take 1 tablet (100 mg total) by mouth at bedtime. (Patient taking differently: Take 150 mg by mouth at  bedtime. )  3  . ibuprofen (ADVIL,MOTRIN) 600 MG tablet Take 600 mg by mouth as needed.     . Probiotic Product (ALIGN PO) Take 3 tablets by mouth daily.    . psyllium (REGULOID) 0.52 G capsule Take 3 capsules by mouth daily.     . simvastatin (ZOCOR) 40 MG tablet Take 1 tablet (40 mg total) by mouth every evening. 30 tablet 3  . tadalafil (CIALIS) 5 MG tablet Take 5 mg by mouth.    . Tamsulosin HCl (FLOMAX) 0.4 MG CAPS Take 0.4 mg by mouth daily.     No current facility-administered medications for this visit.    Allergies as of 03/03/2016 - Review Complete 09/30/2015  Allergen Reaction Noted  . Ciprofloxacin  03/24/2015  . Neurontin [gabapentin]  03/24/2015  . Pregabalin  03/24/2015    Vitals: There were no vitals taken for this visit. Last Weight:  Wt Readings from Last 1 Encounters:  09/30/15 218 lb 6.4 oz (99.066 kg)   Last Height:   Ht Readings from Last 1 Encounters:  09/30/15 5\' 8"  (1.727 m)     Coordination:  Normal finger to nose and heel to shin. Normal rapid alternating movements.   Gait:  Heel-toe intact. Difficulty with Tandem.   Motor Observation:  No asymmetry, no atrophy, and no involuntary movements noted. Tone:  Normal muscle tone.   Posture:  Posture is normal. normal erect   Strength:  Strength is V/V in the upper and lower limbs.    Sensation: decrease to pp and temp to the ankles. 2-3 sec vibration at the great toes. Decrease proprioception at the great toes/    Reflex Exam:  DTR's:  Deep tendon reflexes in the upper and lower extremities are normal bilaterally.  Toes:  The toes are downgoing bilaterally.  Clonus:  Clonus is absent.    Assessment/Plan: 70 year old male with peripheral neuropathy with both small and large fiber involvement. Serum neuropathy  workup has been unrevealing  Serum neuropathy workup was negative Will order an emg/ncs to examine the bilateral lower extremities and  include one radial sensory. The neuropathy is not painful so not treatment at this.  If BPPV becomes worse consider vestibular therapy.    Naomie DeanAntonia Ahern, MD  [facility] [address] [place], [place] [address]  Phone [phone] Fax [phone]  A total of 30 minutes was spent face-to-face with this patient. Over half this time was spent on counseling patient on the neuropathy and vertigo diagnosis and different diagnostic and therapeutic options available.

## 2016-03-03 NOTE — Patient Instructions (Signed)
Remember to drink plenty of fluid, eat healthy meals and do not skip any meals. Try to eat protein with a every meal and eat a healthy snack such as fruit or nuts in between meals. Try to keep a regular sleep-wake schedule and try to exercise daily, particularly in the form of walking, 20-30 minutes a day, if you can.   As far as diagnostic testing: emg/ncs  I would like to see you back for emg/ncs, sooner if we need to. Please call us with any interim questions, concerns, problems, updates or refill requests.   Our phone number is [phone]. We also have an after hours call service for urgent matters and there is a physician on-call for urgent questions. For any emergencies you know to call 911 or go to the nearest emergency room   

## 2016-05-03 ENCOUNTER — Encounter: Payer: Medicare Other | Admitting: Neurology

## 2016-05-09 ENCOUNTER — Encounter: Payer: Medicare Other | Admitting: Neurology

## 2016-08-22 ENCOUNTER — Ambulatory Visit (INDEPENDENT_AMBULATORY_CARE_PROVIDER_SITE_OTHER): Payer: Self-pay | Admitting: Neurology

## 2016-08-22 ENCOUNTER — Ambulatory Visit (INDEPENDENT_AMBULATORY_CARE_PROVIDER_SITE_OTHER): Payer: Medicare Other | Admitting: Neurology

## 2016-08-22 DIAGNOSIS — G629 Polyneuropathy, unspecified: Secondary | ICD-10-CM | POA: Diagnosis not present

## 2016-08-22 DIAGNOSIS — Z0289 Encounter for other administrative examinations: Secondary | ICD-10-CM

## 2016-08-22 NOTE — Progress Notes (Signed)
    History: 70 year old male with chronic peripheral neuropathy in the feet. Serum neuropathy  workup has been unrevealing (lyme, Sjogren's, Thyroid, HgbA1c, B1, IFE, B6, heavy metals, RF, paraneoplastic profile, hep c, rpr, pan-anca, b12, folate, ana, ace). He also has a history of chronic low back pain, spondylosis of lumbar region s/p diskectomy in 2014, chronic bilateral low back pain.s/p metrex decompression at L4-L5 to the right performed on 11-25-13 and METRx decompression at L3-L4 and L4-L5 to the left, performed in 08/05/2010 and Metrex decompression L2-L5 performed in 2008. He is also s/p posterior cervical MED at C6-C7 to the right performed on 02-07-13 and ACDF c5-c7.  MRI lumbar spine 2014: Multilevel lumbar degenerative disease associated with moderate canal stenosis at the L2-L3 through L4-L5 levels and moderate to advanced foraminal stenosis at the L2-L3 through L5-S1 levels. Degenerative changes are similar to the June 2011.  Summary  Nerve conduction studies were performed on the bilateral lower extremities:  The right Peroneal motor nerve showed decreased amplitude(0.4467mV, N>2) with delayed F-Wave latency(3759ms, N<56) The left Peroneal motor nerve showed decreased amplitude(0.2683mV, N>2) with delayed F-Wave latency(4860ms, N<56) The right Tibial motor nerve showed decreased amplitude(0.4168mV, N>3) with delayed F-Wave latency(5260ms, N<58) The left Tibial motor nerve showed decreased amplitude(582mV, N>3) with delayed F-Wave latency(2861ms, N<58) The bilateral Sural sensory nerves were within normal limits The bilateral Superficial Peroneal Sensory nerves were within normal limits The left radial sensory nerve was within normal limits The bilateral H Reflexes were delayed (34ms, N<32)  EMG Needle study was performed on selected left lower extremity muscles:   The  Medial Gastrocnemius and the Anterior Tibialis showed increased motor unit amplitude and diminished motor unit recruitment  and the Extensor Hallucis Longus showed increased motor unit amplitude. The Vastus Medialis, Biceps Femoris(long head), Gluteus Maximus and Medius  muscles were within normal limits.Could not perform on paraspinal muscles due to previous surgery.  Conclusion:Decreased motor conduction amplitudes with delayed f waves and normal sensory potentials consistent with patient's significant history of lumbar radiculopathy s/p multiple surgeries. No acute/ongoing muscle denervation to suggest current lumbar radiculopathy. Delayed H reflexes may be due to lumbar radiculopathy or mild sensory neuropathy. No electrophysiologic evidence for ongoing lumbar radiculopathy or muscle disorder.   Naomie DeanAntonia Ahern, MD  [facility] [address] [place], [place] [address]  Phone [phone] Fax [phone]

## 2016-08-22 NOTE — Patient Instructions (Addendum)
Remember to drink plenty of fluid, eat healthy meals and do not skip any meals. Try to eat protein with a every meal and eat a healthy snack such as fruit or nuts in between meals. Try to keep a regular sleep-wake schedule and try to exercise daily, particularly in the form of walking, 20-30 minutes a day, if you can.   As far as diagnostic testing: Labs  I would like to see you back as needed, sooner if we need to. Please call us with any interim questions, concerns, problems, updates or refill requests.   Our phone number is [phone]. We also have an after hours call service for urgent matters and there is a physician on-call for urgent questions. For any emergencies you know to call 911 or go to the nearest emergency room   

## 2016-08-24 LAB — SJOGREN'S SYNDROME ANTIBODS(SSA + SSB)
ENA SSA (RO) Ab: <0.2 AI (ref 0.0–0.9)
ENA SSB (LA) Ab: <0.2 AI (ref 0.0–0.9)

## 2016-08-24 LAB — B. BURGDORFI ANTIBODIES

## 2016-08-25 ENCOUNTER — Telehealth: Payer: Self-pay | Admitting: *Deleted

## 2016-08-25 NOTE — Telephone Encounter (Signed)
Called and spoke to pt about negative labs per Dr Lucia Gaskinsahern. Pt verbalized understanding and stated he saw results on mychart also.

## 2016-08-25 NOTE — Telephone Encounter (Signed)
-----   Message from Anson FretAntonia B Ahern, MD sent at 08/24/2016  6:05 PM EDT ----- Labs negative

## 2016-08-25 NOTE — Telephone Encounter (Signed)
LVM for patient to call about results. Gave [facility] phone number.   **Ok to inform pt labs negative per Dr Lucia GaskinsAhern.

## 2016-08-28 NOTE — Progress Notes (Signed)
See procedure note.

## 2016-12-01 ENCOUNTER — Encounter (INDEPENDENT_AMBULATORY_CARE_PROVIDER_SITE_OTHER): Payer: Self-pay | Admitting: Orthopedic Surgery

## 2016-12-01 ENCOUNTER — Ambulatory Visit (INDEPENDENT_AMBULATORY_CARE_PROVIDER_SITE_OTHER): Payer: Medicare Other | Admitting: Orthopedic Surgery

## 2016-12-01 DIAGNOSIS — M19011 Primary osteoarthritis, right shoulder: Secondary | ICD-10-CM | POA: Diagnosis not present

## 2016-12-01 DIAGNOSIS — M65311 Trigger thumb, right thumb: Secondary | ICD-10-CM | POA: Diagnosis not present

## 2016-12-01 MED ORDER — LIDOCAINE HCL 1 % IJ SOLN
1.0000 mL | INTRAMUSCULAR | Status: AC | PRN
  Administered 2016-12-01: 1 mL

## 2016-12-01 MED ORDER — METHYLPREDNISOLONE ACETATE 40 MG/ML IJ SUSP
13.3300 mg | INTRAMUSCULAR | Status: AC | PRN
  Administered 2016-12-01: 13.33 mg

## 2016-12-01 MED ORDER — BUPIVACAINE HCL 0.25 % IJ SOLN
0.3300 mL | INTRAMUSCULAR | Status: AC | PRN
  Administered 2016-12-01: .33 mL

## 2016-12-01 NOTE — Progress Notes (Signed)
Office Visit Note   Patient: George Freeman           Date of Birth: 1946-01-16           MRN: [id number] Visit Date: 12/01/2016 Requested by: Tracey Harriesavid Bouska, MD [address] [facility], [place] [address] PCP: Aura DialsBOUSKA,DAVID E, MD  Subjective: Chief Complaint  Patient presents with  . Right Hand - Pain    Triggering thumb?  Marland Kitchen. Left Shoulder - Pain    Left arm pain, bicep/deltiod area. Pain only with ROM    HPI George Channelrvin is a 70 year old patient with right shoulder pain and left arm pain and right trigger thumb.  This been going on for 2 months.  He states he can grip anything with the right thumb because of triggering.  Ibuprofen helps him.  Also describes left upper arm pain for 3-5 months.  This is more biceps pain and he's not too concerned about it.  Patient also reports right shoulder arthritis.  He would like to discuss replacement.  He states that he has been in pain management in the shots helped him for 1-2 months.  The pain hurts him at night.              Review of Systems All systems reviewed are negative as they relate to the chief complaint within the history of present illness.  Patient denies  fevers or chills.    Assessment & Plan: Visit Diagnoses:  1. Arthritis of right shoulder region   2. Trigger thumb, right thumb     Plan: Impression is right trigger thumb.  Ultrasound-guided injection into the tendon sheath is performed today.  That's cut about a 50-50 chance of helping him.  If it doesn't then he will be a candidate for surgical release or just living with the problem.  Left upper  arm pain has been going on 3-5 months.  His exam is pretty benign.  He may have some biceps tendinitis but there is no real intervention required at this time.  In regards to the right shoulder he does have known history of arthritis.  Shots have stopped helping him.  He is considering surgery in the summer.  I will see him back in the spring for  repeat clinical assessment.  Follow-Up Instructions: Return in about 4 months (around 04/01/2017).   Orders:  No orders of the defined types were placed in this encounter.  No orders of the defined types were placed in this encounter.     Procedures: Hand/UE Inj Date/Time: 12/01/2016 6:30 PM Performed by: Cammy CopaEAN, SCOTT Allegra Cerniglia Authorized by: Cammy CopaEAN, SCOTT Saleen Peden   Consent Given by:  Patient Site marked: the procedure site was marked   Timeout: prior to procedure the correct patient, procedure, and site was verified   Indications:  Therapeutic Condition: trigger finger   Location:  Thumb Site:  R thumb A1 Prep: patient was prepped and draped in usual sterile fashion   Needle Size:  25 G Approach:  Volar Medications:  1 mL lidocaine 1 %; 0.33 mL bupivacaine 0.25 %; 13.33 mg methylPREDNISolone acetate 40 MG/ML Patient tolerance:  Patient tolerated the procedure well with no immediate complications     Clinical Data: No additional findings.  Objective: Vital Signs: There were no vitals taken for this visit.  Physical Exam   Constitutional: Patient appears well-developed HEENT:  Head: Normocephalic Eyes:EOM are normal Neck: Normal range of motion Cardiovascular: Normal rate Pulmonary/chest: Effort normal Neurologic: Patient is  alert Skin: Skin is warm Psychiatric: Patient has normal mood and affect    Ortho Exam on examination of the right hand patient does have pain and tenderness at the A1 pulley of the thumb.  Locking is palpable and visible.  EPL FPL function is intact.  Grip strength is otherwise intact.  Collateral ligaments at the MCP joint of the thumb are intact.  Left arm is examined.  He has excellent rotator cuff strength no course grinding or crepitus with active or passive range of motion of the shoulder.  Impingement signs equivocal on the left.  There is no before meals joint tenderness to direct palpation.  No other masses lymph adenopathy or skin changes  noted in the left shoulder region.  Right shoulder is examined.  He has fairly reasonable range of motion with abduction to 90 and forward flexion slightly above 90.  Rotator cuff strength is pretty reasonable.  He does have a well-healed surgical incision anterior aspect of the shoulder with a fall and biceps tendon present.    Specialty Comments:  No specialty comments available.  Imaging: No results found.   PMFS History: Patient Active Problem List   Diagnosis Date Noted  . Hereditary and idiopathic peripheral neuropathy 03/24/2015  . 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 Pain of right heel 12/18/2012   Past Medical History:  Diagnosis Date  . Anxiety   . Arthritis   . BPV (benign positional vertigo)   . Panic disorder     Family History  Problem Relation Age of Onset  . Diabetes Mother   . Alzheimer's disease Sister   . Acute myelogenous leukemia    . Neuropathy Neg Hx     Past Surgical History:  Procedure Laterality Date  . ANAL FISSURE REPAIR  1993  . APPENDECTOMY  1969  . 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 BACK SURGERY  2011   Stenosis, sciatica  . BACK SURGERY  December 2014  . CERVICAL SPINE SURGERY  Feb. 2014  . CERVICAL SPINE SURGERY  07/23/15   fusion  . KNEE SURGERY     2x- cartilage  . left foot surgery  1985  . Left shoulder surgery  1990  . Right shoulder surgery  2012  . TOTAL KNEE ARTHROPLASTY  January 2011  . TOTAL KNEE ARTHROPLASTY  Feb. 2012  . VASECTOMY     Social History   Occupational History  . Retired    Social History Main Topics  . Smoking status: Never Smoker  . Smokeless tobacco: Not on file  . Alcohol use No  . Drug use: No  . Sexual activity: Not on file

## 2016-12-27 ENCOUNTER — Telehealth (INDEPENDENT_AMBULATORY_CARE_PROVIDER_SITE_OTHER): Payer: Self-pay | Admitting: Orthopedic Surgery

## 2016-12-27 NOTE — Telephone Encounter (Signed)
Pt called wanting to set up surgery for sometime in February for his shoulder. He had a MRI and CT done a year ago and wants to know if he needs to have that redone? Also, does he need to come in and see you before scheduling surgery? And pt wants to know how long the recovery time is and if he would be able to travel on a plane on May 5th?

## 2017-01-03 NOTE — Telephone Encounter (Signed)
Please call patient and tell him that we'll get the surgery set up for him.  He should be able to be on the plane by the fifth.  And also cannulae the blue sheet at my desk thanks

## 2017-01-04 NOTE — Telephone Encounter (Signed)
I assume you have this blue sheet now?

## 2017-01-04 NOTE — Telephone Encounter (Signed)
It is at my desk and I need to speak with you tomorrow about it, we have to order a special CT scan for it.

## 2017-01-04 NOTE — Telephone Encounter (Signed)
No I do not have a blue sheet.

## 2017-01-10 ENCOUNTER — Other Ambulatory Visit (INDEPENDENT_AMBULATORY_CARE_PROVIDER_SITE_OTHER): Payer: Self-pay | Admitting: Radiology

## 2017-01-10 DIAGNOSIS — M19011 Primary osteoarthritis, right shoulder: Secondary | ICD-10-CM

## 2017-01-31 ENCOUNTER — Telehealth (INDEPENDENT_AMBULATORY_CARE_PROVIDER_SITE_OTHER): Payer: Self-pay | Admitting: Orthopedic Surgery

## 2017-01-31 NOTE — Telephone Encounter (Signed)
Pt will call in May to schedule surgery and his CT scan after his trip.

## 2017-03-31 ENCOUNTER — Ambulatory Visit
Admission: RE | Admit: 2017-03-31 | Discharge: 2017-03-31 | Disposition: A | Discharge status: Home / Self Care | Payer: PRIVATE HEALTH INSURANCE | Source: Ambulatory Visit | Attending: Orthopedic Surgery | Admitting: Orthopedic Surgery

## 2017-03-31 DIAGNOSIS — M19011 Primary osteoarthritis, right shoulder: Secondary | ICD-10-CM

## 2017-04-04 ENCOUNTER — Telehealth (INDEPENDENT_AMBULATORY_CARE_PROVIDER_SITE_OTHER): Payer: Self-pay | Admitting: Orthopedic Surgery

## 2017-04-04 ENCOUNTER — Other Ambulatory Visit (INDEPENDENT_AMBULATORY_CARE_PROVIDER_SITE_OTHER): Payer: Self-pay | Admitting: Orthopedic Surgery

## 2017-04-04 DIAGNOSIS — M19011 Primary osteoarthritis, right shoulder: Secondary | ICD-10-CM

## 2017-04-04 NOTE — Telephone Encounter (Signed)
I have disc will get them to you

## 2017-04-04 NOTE — Telephone Encounter (Signed)
Pt called and stated he has now got his CT scan completed of his shoulder. Can you have  imaging send [place] over the thin slice CT scan disc for [employer]? After we receive the disc I will schedule the pts surgery. Thank you!

## 2017-05-15 ENCOUNTER — Encounter: Payer: Self-pay | Admitting: Neurology

## 2017-05-15 ENCOUNTER — Encounter (HOSPITAL_COMMUNITY): Payer: Self-pay

## 2017-05-15 ENCOUNTER — Ambulatory Visit (INDEPENDENT_AMBULATORY_CARE_PROVIDER_SITE_OTHER): Payer: Medicare Other | Admitting: Neurology

## 2017-05-15 VITALS — BP 113/71 | HR 92 | Ht 68.0 in | Wt 209.2 lb

## 2017-05-15 DIAGNOSIS — G609 Hereditary and idiopathic neuropathy, unspecified: Secondary | ICD-10-CM

## 2017-05-15 MED ORDER — GABAPENTIN 100 MG PO CAPS: 100.0000 mg | ORAL_CAPSULE | Freq: Three times a day (TID) | ORAL | 11 refills | Status: DC

## 2017-05-15 NOTE — Patient Instructions (Addendum)
Remember to drink plenty of fluid, eat healthy meals and do not skip any meals. Try to eat protein with a every meal and eat a healthy snack such as fruit or nuts in between meals. Try to keep a regular sleep-wake schedule and try to exercise daily, particularly in the form of walking, 20-30 minutes a day, if you can.   As far as your medications are concerned, I would like to suggest Gabapentin, alpha lipoic acid 400-600mg  a day  I would like to see you back as needed, sooner if we need to. Please call us with any interim questions, concerns, problems, updates or refill requests.    Our phone number is [phone]. We also have an after hours call service for urgent matters and there is a physician on-call for urgent questions. For any emergencies you know to call 911 or go to the nearest emergency room  Gabapentin capsules or tablets What is this medicine? GABAPENTIN (GA ba pen tin) is used to control partial seizures in adults with epilepsy. It is also used to treat certain types of nerve pain. This medicine may be used for other purposes; ask your health care provider or pharmacist if you have questions. COMMON BRAND NAME(S): Active-PAC with Gabapentin, Gabarone, Neurontin What should I tell my health care provider before I take this medicine? They need to know if you have any of these conditions: -kidney disease -suicidal thoughts, plans, or attempt; a previous suicide attempt by you or a family member -an unusual or allergic reaction to gabapentin, other medicines, foods, dyes, or preservatives -pregnant or trying to get pregnant -breast-feeding How should I use this medicine? Take this medicine by mouth with a glass of water. Follow the directions on the prescription label. You can take it with or without food. If it upsets your stomach, take it with food.Take your medicine at regular intervals. Do not take it more often than directed. Do not stop taking except on your doctor's  advice. If you are directed to break the 600 or 800 mg tablets in half as part of your dose, the extra half tablet should be used for the next dose. If you have not used the extra half tablet within 28 days, it should be thrown away. A special MedGuide will be given to you by the pharmacist with each prescription and refill. Be sure to read this information carefully each time. Talk to your pediatrician regarding the use of this medicine in children. Special care may be needed. Overdosage: If you think you have taken too much of this medicine contact a poison control center or emergency room at once. NOTE: This medicine is only for you. Do not share this medicine with others. What if I miss a dose? If you miss a dose, take it as soon as you can. If it is almost time for your next dose, take only that dose. Do not take double or extra doses. What may interact with this medicine? Do not take this medicine with any of the following medications: -other gabapentin products This medicine may also interact with the following medications: -alcohol -antacids -antihistamines for allergy, cough and cold -certain medicines for anxiety or sleep -certain medicines for depression or psychotic disturbances -homatropine; hydrocodone -naproxen -narcotic medicines (opiates) for pain -phenothiazines like chlorpromazine, mesoridazine, prochlorperazine, thioridazine This list may not describe all possible interactions. Give your health care provider a list of all the medicines, herbs, non-prescription drugs, or dietary supplements you use. Also tell them if you smoke, drink  alcohol, or use illegal drugs. Some items may interact with your medicine. What should I watch for while using this medicine? Visit your doctor or health care professional for regular checks on your progress. You may want to keep a record at home of how you feel your condition is responding to treatment. You may want to share this information  with your doctor or health care professional at each visit. You should contact your doctor or health care professional if your seizures get worse or if you have any new types of seizures. Do not stop taking this medicine or any of your seizure medicines unless instructed by your doctor or health care professional. Stopping your medicine suddenly can increase your seizures or their severity. Wear a medical identification bracelet or chain if you are taking this medicine for seizures, and carry a card that lists all your medications. You may get drowsy, dizzy, or have blurred vision. Do not drive, use machinery, or do anything that needs mental alertness until you know how this medicine affects you. To reduce dizzy or fainting spells, do not sit or stand up quickly, especially if you are an older patient. Alcohol can increase drowsiness and dizziness. Avoid alcoholic drinks. Your mouth may get dry. Chewing sugarless gum or sucking hard candy, and drinking plenty of water will help. The use of this medicine may increase the chance of suicidal thoughts or actions. Pay special attention to how you are responding while on this medicine. Any worsening of mood, or thoughts of suicide or dying should be reported to your health care professional right away. Women who become pregnant while using this medicine may enroll in the [place] American Antiepileptic Drug Pregnancy Registry by calling [phone]. This registry collects information about the safety of antiepileptic drug use during pregnancy. What side effects may I notice from receiving this medicine? Side effects that you should report to your doctor or health care professional as soon as possible: -allergic reactions like skin rash, itching or hives, swelling of the face, lips, or tongue -worsening of mood, thoughts or actions of suicide or dying Side effects that usually do not require medical attention (report to your doctor or health care professional if  they continue or are bothersome): -constipation -difficulty walking or controlling muscle movements -dizziness -nausea -slurred speech -tiredness -tremors -weight gain This list may not describe all possible side effects. Call your doctor for medical advice about side effects. You may report side effects to FDA at 1-800-FDA-1088. Where should I keep my medicine? Keep out of reach of children. This medicine may cause accidental overdose and death if it taken by other adults, children, or pets. Mix any unused medicine with a substance like cat litter or coffee grounds. Then throw the medicine away in a sealed container like a sealed bag or a coffee can with a lid. Do not use the medicine after the expiration date. Store at room temperature between 15 and 30 degrees C (59 and 86 degrees F). NOTE: This sheet is a summary. It may not cover all possible information. If you have questions about this medicine, talk to your doctor, pharmacist, or health care provider.  2018 Elsevier/Gold Standard (2014-02-07 15:26:50)

## 2017-05-15 NOTE — Progress Notes (Signed)
[facility]    Provider:  Dr Lucia GaskinsAhern Referring Provider: Tracey HarriesBouska, David, MD Primary Care Physician:  Tracey HarriesBouska, David, MD  CC: neuropathy  Interval history 05/15/2017: Patient returns for idiopathic neuropathy. EMG/NCS did not indicate any new etiology for his symptoms. Extensive lab testing unremarkable.(lyme, Sjogren's, Thyroid, HgbA1c, B1, IFE, B6, heavy metals, RF, paraneoplastic profile, hep c, rpr, pan-anca, b12, folate, ana, ace). He also has a history of chronic low back pain, spondylosis of lumbar region s/p diskectomy in 2014, chronic bilateral low back pain.s/p metrex decompression at L4-L5 to the right performed on 11-25-13 and METRx decompression at L3-L4 and L4-L5 to the left, performed in 08/05/2010 and Metrex decompression L2-L5 performed in 2008. He is also s/p posterior cervical MED at C6-C7 to the right performed on 02-07-13 and ACDF c5-c7.   The toes are more burning. Worse with inactivity. Tried gabapentin, Lyrica, Amitriptyline. He sees pain management. Discussed idiopathic, small fiber neuropathy. No significant sie effects to neurontin, he took it but is agreeable to take it again since it has been so long.  Decreased motor conduction amplitudes with delayed f waves and normal sensory potentials consistent with patient's significant history of lumbar radiculopathy s/p multiple surgeries. No acute/ongoing muscle denervation to suggest current lumbar radiculopathy. Delayed H reflexes may be due to lumbar radiculopathy or mild sensory neuropathy. No electrophysiologic evidence for ongoing lumbar radiculopathy or muscle disorder.   Interval history 03/03/2016: Patient here for follow up on idiopathic peripheral neuropathy. 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  The vertigo has changed, it is more often, Epley maneuvers have helped. The symptoms are different, onset wasn't as dramatic this time but more persistent. It did resolve after the Epley Maneuver. Worse with  positioning head back or in bed. He has room spinning. He had a +Dix Hallpike on exam. On testing he would also become symptomatic when placing his head back and to the right. An MRI was ordered but he did not have it completed.  Reviewed the labs for his neuropathy which were all unremarkable including ACE, ANA, B12 and folate, Pan-ANCA, RPR, Hep C, RF, Heavy metals, Vitamin B6, IFE, B1, Hgba1c, TSH. No history of alcohol use. Reviewed an extensive list of drugs that can cause neuropathies for any risk factors: No long term antibiotics, no chemotherapeutic drugs, No cardiovascular drugs such as amiodarone or others, no rheumatologic drugs like colchicine or allopurinol, he was on lithium for 2 years which can cause sensorimotor changes, [place] but unknown exposure to toxins. Neuropathy is stable. No pain.   HPI: George Freeman is a 71 y.o. male here as a referral from Dr. Everlene OtherBouska for neuropathy. PMHx BPPV, Panic disorder, Arthritis, anxiety.   Symptoms started 5 years ago. Both feet symmetric, numbness, get cold at night. Cramps at night in the toes if he moves his toes. Feels like something is on the bottom of the feet when walking. Worse at night in bed. He can't feel them. No pain. He had vascular testing and normal. Always there, nothing makes it better. No pain at all. Slowly progressive. Symptoms in the anterior part of the feet. He has a tendency to drop things, no sensory changes in the hands. But hands tend to be colder. No weakness.   Review of Systems: Patient complains of symptoms per HPI as well as the following symptoms: anxiety, restless legs. Pertinent negatives per HPI. All others negative.    Social History   Social History  .  Marital status: Married    Spouse name: N/A  . Number of children: 2  . Years of education: PHD   Occupational History  . Retired    Social History Main Topics  . Smoking status: Never Smoker  . Smokeless tobacco: Never Used   . Alcohol use No  . Drug use: No  . Sexual activity: Not on file   Other Topics Concern  . Not on file   Social History Narrative   Lives at home with wife.    Right-handed   Caffeine use: very little     Family History  Problem Relation Age of Onset  . Diabetes Mother   . Alzheimer's disease Sister   . Acute myelogenous leukemia Unknown   . Neuropathy Neg Hx     Past Medical History:  Diagnosis Date  . Anxiety   . Arthritis   . BPV (benign positional vertigo)   . Panic disorder     Past Surgical History:  Procedure Laterality Date  . ANAL FISSURE REPAIR  1993  . APPENDECTOMY  1969  . 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 BACK SURGERY  2011   Stenosis, sciatica  . BACK SURGERY  December 2014  . CERVICAL SPINE SURGERY  Feb. 2014  . CERVICAL SPINE SURGERY  07/23/15   fusion  . KNEE SURGERY     2x- cartilage  . left foot surgery  1985  . Left shoulder surgery  1990  . Right shoulder surgery  2012  . TOTAL KNEE ARTHROPLASTY  January 2011  . TOTAL KNEE ARTHROPLASTY  Feb. 2012  . VASECTOMY      Current Outpatient Prescriptions  Medication Sig Dispense Refill  . ALPRAZolam (XANAX) 0.25 MG tablet Take 1 tablet (0.25 mg total) by mouth 4 (four) times daily. (Patient taking differently: Take 0.25 mg by mouth daily as needed for anxiety or sleep. ) 120 tablet 0  . fluvoxaMINE (LUVOX) 100 MG tablet Take 1 tablet (100 mg total) by mouth at bedtime. (Patient taking differently: Take 200 mg by mouth at bedtime. )  3  . ibuprofen (ADVIL,MOTRIN) 200 MG tablet Take 800 mg by mouth every 8 (eight) hours as needed for mild pain or moderate pain.    . Probiotic Product (ALIGN PO) Take 1 tablet by mouth daily.     . psyllium (REGULOID) 0.52 G capsule Take 2 capsules by mouth at bedtime.     . simvastatin (ZOCOR) 40 MG tablet Take 1 tablet (40 mg total) by mouth every evening. 30 tablet 3  . Tamsulosin HCl (FLOMAX) 0.4 MG CAPS Take 0.4 mg by mouth daily.    . tadalafil  (CIALIS) 5 MG tablet Take 5 mg by mouth daily.      No current facility-administered medications for this visit.     Allergies as of 05/15/2017 - Review Complete 05/15/2017  Allergen Reaction Noted  . Amitriptyline Other (See Comments) 05/12/2017  . Ciprofloxacin Other (See Comments) 03/24/2015  . Neurontin [gabapentin] Anxiety 03/24/2015  . Pregabalin Anxiety 03/24/2015    Vitals: BP 113/71   Pulse 92   Ht 5\' 8"  (1.727 m)   Wt 209 lb 3.2 oz (94.9 kg)   BMI 31.81 kg/m  Last Weight:  Wt Readings from Last 1 Encounters:  05/15/17 209 lb 3.2 oz (94.9 kg)   Last Height:   Ht Readings from Last 1 Encounters:  05/15/17 5\' 8"  (1.727 m)    Coordination:  Normal finger to nose and heel  to shin. Normal rapid alternating movements.   Gait:  Heel-toe intact. Difficulty with Tandem.   Motor Observation:  No asymmetry, no atrophy, and no involuntary movements noted. Tone:  Normal muscle tone.   Posture:  Posture is normal. normal erect   Strength:  Strength is V/V in the upper and lower limbs.    Sensation: decrease to pp and temp to the ankles. 2-3 sec vibration at the great toes. Decrease proprioception at the great toes/    Reflex Exam:  DTR's:  Deep tendon reflexes in the upper and lower extremities are normal bilaterally.  Toes:  The toes are downgoing bilaterally.  Clonus:  Clonus is absent.    Assessment/Plan: 71 year old male with idiopathic peripheral neuropathy Extensive Serum neuropathy workup and emg/ncs has been unrevealing. Retry gabapentin. F/u with pain management.   Naomie Dean, MD  [facility] [address] [place], [place] [address]  Phone [phone] Fax [phone]  A total of 15 minutes was spent face-to-face with this patient. Over half this time was spent on counseling patient on the idiopathic neuropathy diagnosis and different diagnostic and therapeutic  options available.

## 2017-05-15 NOTE — Pre-Procedure Instructions (Signed)
George Freeman  05/15/2017      [facility] [phone] [place] - [address] AT [facility] [address] [place] [id number] Phone: [phone] Fax: [phone]     Your procedure is scheduled on May 29  [facility] at 1015 A.M.  Call this number if you have problems the morning of surgery:  [phone]   Remember:  Do not eat food or drink liquids after midnight.  Take these medicines the morning of surgery with A SIP OF WATER Xanax if needed, gabapentin (Neurontin), Tamsulosin  Stop taking aspirin, BC's, Goody's, Herbal medications, Ibuprofen, Advil, Motrin, Aleve   Do not wear jewelry, make-up or nail polish.  Do not wear lotions, powders, or perfumes, or deoderant.  Do not shave 48 hours prior to surgery.  Men may shave face and neck.  Do not bring valuables to the hospital.  [facility] is not responsible for any belongings or valuables.  Contacts, dentures or bridgework may not be worn into surgery.  Leave your suitcase in the car.  After surgery it may be brought to your room.  For patients admitted to the hospital, discharge time will be determined by your treatment team.  Patients discharged the day of surgery will not be allowed to drive home.   Special instructions:  [place] - Preparing for Surgery  Before surgery, you can play an important role.  Because skin is not sterile, your skin needs to be as free of germs as possible.  You can reduce the number of germs on you skin by washing with CHG (chlorahexidine gluconate) soap before surgery.  CHG is an antiseptic cleaner which kills germs and bonds with the skin to continue killing germs even after washing.  Please DO NOT use if you have an allergy to CHG or antibacterial soaps.  If your skin becomes reddened/irritated stop using the CHG and inform your nurse when you arrive at Short Stay.  Do not shave (including legs and underarms)  for at least 48 hours prior to the first CHG shower.  You may shave your face.  Please follow these instructions carefully:   1.  Shower with CHG Soap the night before surgery and the                                morning of Surgery.  2.  If you choose to wash your hair, wash your hair first as usual with your       normal shampoo.  3.  After you shampoo, rinse your hair and body thoroughly to remove the                      Shampoo.  4.  Use CHG as you would any other liquid soap.  You can apply chg directly       to the skin and wash gently with scrungie or a clean washcloth.  5.  Apply the CHG Soap to your body ONLY FROM THE NECK DOWN.        Do not use on open wounds or open sores.  Avoid contact with your eyes,       ears, mouth and genitals (private parts).  Wash genitals (private parts)       with your normal soap.  6.  Wash thoroughly, paying special attention to the  area where your surgery        will be performed.  7.  Thoroughly rinse your body with warm water from the neck down.  8.  DO NOT shower/wash with your normal soap after using and rinsing off       the CHG Soap.  9.  Pat yourself dry with a clean towel.            10.  Wear clean pajamas.            11.  Place clean sheets on your bed the night of your first shower and do not        sleep with pets.  Day of Surgery  Do not apply any lotions/deoderants the morning of surgery.  Please wear clean clothes to the hospital/surgery center.     Please read over the following fact sheets that you were given. Pain Booklet, Coughing and Deep Breathing, MRSA Information and Surgical Site Infection Prevention

## 2017-05-16 ENCOUNTER — Encounter (HOSPITAL_COMMUNITY): Payer: Self-pay

## 2017-05-16 ENCOUNTER — Ambulatory Visit (HOSPITAL_COMMUNITY)
Admission: RE | Admit: 2017-05-16 | Discharge: 2017-05-16 | Disposition: A | Discharge status: Home / Self Care | Payer: Medicare Other | Source: Ambulatory Visit | Attending: Orthopedic Surgery | Admitting: Orthopedic Surgery

## 2017-05-16 ENCOUNTER — Encounter (HOSPITAL_COMMUNITY)
Admission: RE | Admit: 2017-05-16 | Discharge: 2017-05-16 | Disposition: A | Discharge status: Home / Self Care | Payer: Medicare Other | Source: Ambulatory Visit | Attending: Orthopedic Surgery | Admitting: Orthopedic Surgery

## 2017-05-16 DIAGNOSIS — Z01810 Encounter for preprocedural cardiovascular examination: Secondary | ICD-10-CM | POA: Insufficient documentation

## 2017-05-16 DIAGNOSIS — K219 Gastro-esophageal reflux disease without esophagitis: Secondary | ICD-10-CM | POA: Insufficient documentation

## 2017-05-16 DIAGNOSIS — F410 Panic disorder [episodic paroxysmal anxiety] without agoraphobia: Secondary | ICD-10-CM | POA: Insufficient documentation

## 2017-05-16 DIAGNOSIS — F419 Anxiety disorder, unspecified: Secondary | ICD-10-CM | POA: Insufficient documentation

## 2017-05-16 DIAGNOSIS — G709 Myoneural disorder, unspecified: Secondary | ICD-10-CM | POA: Insufficient documentation

## 2017-05-16 DIAGNOSIS — G609 Hereditary and idiopathic neuropathy, unspecified: Secondary | ICD-10-CM | POA: Insufficient documentation

## 2017-05-16 DIAGNOSIS — Z01818 Encounter for other preprocedural examination: Secondary | ICD-10-CM | POA: Insufficient documentation

## 2017-05-16 DIAGNOSIS — M79671 Pain in right foot: Secondary | ICD-10-CM | POA: Insufficient documentation

## 2017-05-16 DIAGNOSIS — Z01812 Encounter for preprocedural laboratory examination: Secondary | ICD-10-CM | POA: Insufficient documentation

## 2017-05-16 DIAGNOSIS — F329 Major depressive disorder, single episode, unspecified: Secondary | ICD-10-CM | POA: Insufficient documentation

## 2017-05-16 DIAGNOSIS — Z419 Encounter for procedure for purposes other than remedying health state, unspecified: Secondary | ICD-10-CM

## 2017-05-16 HISTORY — DX: Gastro-esophageal reflux disease without esophagitis: K21.9

## 2017-05-16 HISTORY — DX: Nausea with vomiting, unspecified: Z98.890

## 2017-05-16 HISTORY — DX: Major depressive disorder, single episode, unspecified: F32.9

## 2017-05-16 HISTORY — DX: Other specified postprocedural states: R11.2

## 2017-05-16 HISTORY — DX: Depression, unspecified: F32.A

## 2017-05-16 HISTORY — DX: Myoneural disorder, unspecified: G70.9

## 2017-05-16 LAB — COMPREHENSIVE METABOLIC PANEL
ALT: 31 U/L (ref 17–63)
AST: 28 U/L (ref 15–41)
Albumin: 4.2 g/dL (ref 3.5–5.0)
Alkaline Phosphatase: 68 U/L (ref 38–126)
Anion gap: 6 (ref 5–15)
BUN: 14 mg/dL (ref 6–20)
CALCIUM: 9.3 mg/dL (ref 8.9–10.3)
CHLORIDE: 109 mmol/L (ref 101–111)
CO2: 26 mmol/L (ref 22–32)
CREATININE: 1.16 mg/dL (ref 0.61–1.24)
Glucose, Bld: 118 mg/dL — ABNORMAL HIGH (ref 65–99)
Potassium: 4.4 mmol/L (ref 3.5–5.1)
Sodium: 141 mmol/L (ref 135–145)
Total Bilirubin: 0.9 mg/dL (ref 0.3–1.2)
Total Protein: 6.7 g/dL (ref 6.5–8.1)

## 2017-05-16 LAB — CBC
HCT: 50.4 % (ref 39.0–52.0)
Hemoglobin: 17.2 g/dL — ABNORMAL HIGH (ref 13.0–17.0)
MCH: 30.1 pg (ref 26.0–34.0)
MCHC: 34.1 g/dL (ref 30.0–36.0)
MCV: 88.3 fL (ref 78.0–100.0)
PLATELETS: 147 10*3/uL — AB (ref 150–400)
RBC: 5.71 MIL/uL (ref 4.22–5.81)
RDW: 14.8 % (ref 11.5–15.5)
WBC: 6.5 10*3/uL (ref 4.0–10.5)

## 2017-05-16 LAB — URINALYSIS, ROUTINE W REFLEX MICROSCOPIC
BILIRUBIN URINE: NEGATIVE
Glucose, UA: 50 mg/dL — AB
Hgb urine dipstick: NEGATIVE
KETONES UR: NEGATIVE mg/dL
Leukocytes, UA: NEGATIVE
NITRITE: NEGATIVE
Protein, ur: NEGATIVE mg/dL
Specific Gravity, Urine: 1.016 (ref 1.005–1.030)
pH: 5 (ref 5.0–8.0)

## 2017-05-16 LAB — SURGICAL PCR SCREEN
MRSA, PCR: NEGATIVE
STAPHYLOCOCCUS AUREUS: NEGATIVE

## 2017-05-16 NOTE — Progress Notes (Signed)
PCp is Dr. Tracey Harriesavid Bouska Denies ever seeing a cardiologist. Denies any chest pain, cough, or fever. Denies ever having a stress test, card cath, or echo.

## 2017-05-17 LAB — URINE CULTURE

## 2017-05-23 ENCOUNTER — Encounter (HOSPITAL_COMMUNITY): Payer: Self-pay | Admitting: *Deleted

## 2017-05-23 ENCOUNTER — Inpatient Hospital Stay (HOSPITAL_COMMUNITY)
Admission: RE | Admit: 2017-05-23 | Discharge: 2017-05-24 | DRG: 483 | Disposition: A | Discharge status: Home / Self Care | Payer: Medicare Other | Source: Ambulatory Visit | Attending: Orthopedic Surgery | Admitting: Orthopedic Surgery

## 2017-05-23 ENCOUNTER — Ambulatory Visit (HOSPITAL_COMMUNITY): Payer: Medicare Other | Admitting: Anesthesiology

## 2017-05-23 ENCOUNTER — Inpatient Hospital Stay (HOSPITAL_COMMUNITY): Payer: Medicare Other

## 2017-05-23 ENCOUNTER — Encounter (HOSPITAL_COMMUNITY)
Admission: RE | Disposition: A | Discharge status: Home / Self Care | Payer: Self-pay | Source: Ambulatory Visit | Attending: Orthopedic Surgery

## 2017-05-23 DIAGNOSIS — M19011 Primary osteoarthritis, right shoulder: Secondary | ICD-10-CM | POA: Diagnosis not present

## 2017-05-23 DIAGNOSIS — F419 Anxiety disorder, unspecified: Secondary | ICD-10-CM | POA: Diagnosis present

## 2017-05-23 DIAGNOSIS — Z881 Allergy status to other antibiotic agents status: Secondary | ICD-10-CM | POA: Diagnosis not present

## 2017-05-23 DIAGNOSIS — F410 Panic disorder [episodic paroxysmal anxiety] without agoraphobia: Secondary | ICD-10-CM | POA: Diagnosis present

## 2017-05-23 DIAGNOSIS — Z981 Arthrodesis status: Secondary | ICD-10-CM

## 2017-05-23 DIAGNOSIS — Z79899 Other long term (current) drug therapy: Secondary | ICD-10-CM | POA: Diagnosis not present

## 2017-05-23 DIAGNOSIS — M25511 Pain in right shoulder: Secondary | ICD-10-CM | POA: Diagnosis present

## 2017-05-23 DIAGNOSIS — Z888 Allergy status to other drugs, medicaments and biological substances status: Secondary | ICD-10-CM

## 2017-05-23 DIAGNOSIS — M19019 Primary osteoarthritis, unspecified shoulder: Secondary | ICD-10-CM | POA: Diagnosis present

## 2017-05-23 DIAGNOSIS — K219 Gastro-esophageal reflux disease without esophagitis: Secondary | ICD-10-CM | POA: Diagnosis present

## 2017-05-23 DIAGNOSIS — F329 Major depressive disorder, single episode, unspecified: Secondary | ICD-10-CM | POA: Diagnosis present

## 2017-05-23 HISTORY — PX: TOTAL SHOULDER ARTHROPLASTY: SHX126

## 2017-05-23 SURGERY — ARTHROPLASTY, SHOULDER, TOTAL
Anesthesia: General | Site: Shoulder | Laterality: Right

## 2017-05-23 MED ORDER — SUGAMMADEX SODIUM 200 MG/2ML IV SOLN
INTRAVENOUS | Status: DC | PRN
  Administered 2017-05-23: 200 mg via INTRAVENOUS

## 2017-05-23 MED ORDER — CHLORHEXIDINE GLUCONATE 4 % EX LIQD: 60.0000 mL | Freq: Once | CUTANEOUS | Status: DC

## 2017-05-23 MED ORDER — ONDANSETRON HCL 4 MG/2ML IJ SOLN
INTRAMUSCULAR | Status: DC | PRN
  Administered 2017-05-23: 4 mg via INTRAVENOUS

## 2017-05-23 MED ORDER — BUPIVACAINE HCL (PF) 0.5 % IJ SOLN
INTRAMUSCULAR | Status: AC
  Filled 2017-05-23: qty 30

## 2017-05-23 MED ORDER — PROPOFOL 10 MG/ML IV BOLUS
INTRAVENOUS | Status: AC
  Filled 2017-05-23: qty 20

## 2017-05-23 MED ORDER — ROCURONIUM BROMIDE 10 MG/ML (PF) SYRINGE
PREFILLED_SYRINGE | INTRAVENOUS | Status: AC
  Filled 2017-05-23: qty 5

## 2017-05-23 MED ORDER — ACETAMINOPHEN 325 MG PO TABS: 650.0000 mg | ORAL_TABLET | Freq: Four times a day (QID) | ORAL | Status: DC | PRN

## 2017-05-23 MED ORDER — PHENOL 1.4 % MT LIQD: 1.0000 | OROMUCOSAL | Status: DC | PRN

## 2017-05-23 MED ORDER — VANCOMYCIN HCL 500 MG IV SOLR
INTRAVENOUS | Status: DC | PRN
  Administered 2017-05-23: 500 mg

## 2017-05-23 MED ORDER — POTASSIUM CHLORIDE IN NACL 20-0.9 MEQ/L-% IV SOLN: INTRAVENOUS | Status: DC

## 2017-05-23 MED ORDER — LIDOCAINE HCL (CARDIAC) 20 MG/ML IV SOLN
INTRAVENOUS | Status: DC | PRN
  Administered 2017-05-23: 100 mg via INTRAVENOUS

## 2017-05-23 MED ORDER — DEXAMETHASONE SODIUM PHOSPHATE 4 MG/ML IJ SOLN
INTRAMUSCULAR | Status: DC | PRN
  Administered 2017-05-23: 8 mg via INTRAVENOUS

## 2017-05-23 MED ORDER — ONDANSETRON HCL 4 MG PO TABS: 4.0000 mg | ORAL_TABLET | Freq: Four times a day (QID) | ORAL | Status: DC | PRN

## 2017-05-23 MED ORDER — CEFAZOLIN SODIUM-DEXTROSE 2-4 GM/100ML-% IV SOLN
2.0000 g | INTRAVENOUS | Status: AC
  Administered 2017-05-23: 2 g via INTRAVENOUS

## 2017-05-23 MED ORDER — EPHEDRINE 5 MG/ML INJ
INTRAVENOUS | Status: AC
  Filled 2017-05-23: qty 10

## 2017-05-23 MED ORDER — SODIUM CHLORIDE 0.9 % IR SOLN
Status: DC | PRN
  Administered 2017-05-23: 1000 mL

## 2017-05-23 MED ORDER — MIDAZOLAM HCL 2 MG/2ML IJ SOLN
INTRAMUSCULAR | Status: AC
  Administered 2017-05-23: 2 mg
  Filled 2017-05-23: qty 2

## 2017-05-23 MED ORDER — METHOCARBAMOL 500 MG PO TABS
500.0000 mg | ORAL_TABLET | Freq: Four times a day (QID) | ORAL | Status: DC | PRN
  Administered 2017-05-24: 500 mg via ORAL
  Filled 2017-05-23: qty 1

## 2017-05-23 MED ORDER — SIMVASTATIN 20 MG PO TABS
40.0000 mg | ORAL_TABLET | Freq: Every evening | ORAL | Status: DC
Start: 2017-05-23 — End: 2017-05-24
  Administered 2017-05-23: 40 mg via ORAL
  Filled 2017-05-23: qty 2

## 2017-05-23 MED ORDER — METOCLOPRAMIDE HCL 5 MG/ML IJ SOLN
INTRAMUSCULAR | Status: DC | PRN
  Administered 2017-05-23: 10 mg via INTRAVENOUS

## 2017-05-23 MED ORDER — METOCLOPRAMIDE HCL 5 MG/ML IJ SOLN
INTRAMUSCULAR | Status: AC
  Filled 2017-05-23: qty 2

## 2017-05-23 MED ORDER — MORPHINE SULFATE (PF) 4 MG/ML IV SOLN
INTRAVENOUS | Status: AC
  Filled 2017-05-23: qty 2

## 2017-05-23 MED ORDER — PROPOFOL 10 MG/ML IV BOLUS
INTRAVENOUS | Status: DC | PRN
  Administered 2017-05-23: 200 mg via INTRAVENOUS

## 2017-05-23 MED ORDER — ONDANSETRON HCL 4 MG/2ML IJ SOLN
INTRAMUSCULAR | Status: AC
  Filled 2017-05-23: qty 2

## 2017-05-23 MED ORDER — MORPHINE SULFATE (PF) 4 MG/ML IV SOLN
INTRAVENOUS | Status: DC | PRN
  Administered 2017-05-23: 8 mg via INTRAMUSCULAR

## 2017-05-23 MED ORDER — VANCOMYCIN HCL 500 MG IV SOLR
INTRAVENOUS | Status: AC
  Filled 2017-05-23: qty 500

## 2017-05-23 MED ORDER — METOCLOPRAMIDE HCL 5 MG/ML IJ SOLN: 5.0000 mg | Freq: Three times a day (TID) | INTRAMUSCULAR | Status: DC | PRN

## 2017-05-23 MED ORDER — ALBUMIN HUMAN 5 % IV SOLN
INTRAVENOUS | Status: DC | PRN
  Administered 2017-05-23: 13:00:00 via INTRAVENOUS

## 2017-05-23 MED ORDER — LIDOCAINE 2% (20 MG/ML) 5 ML SYRINGE
INTRAMUSCULAR | Status: AC
  Filled 2017-05-23: qty 5

## 2017-05-23 MED ORDER — DEXTROSE 5 % IV SOLN
500.0000 mg | Freq: Four times a day (QID) | INTRAVENOUS | Status: DC | PRN
  Filled 2017-05-23: qty 5

## 2017-05-23 MED ORDER — FENTANYL CITRATE (PF) 100 MCG/2ML IJ SOLN
INTRAMUSCULAR | Status: DC | PRN
  Administered 2017-05-23: 100 ug via INTRAVENOUS

## 2017-05-23 MED ORDER — LACTATED RINGERS IV SOLN: INTRAVENOUS | Status: DC

## 2017-05-23 MED ORDER — CLONIDINE HCL (ANALGESIA) 100 MCG/ML EP SOLN
150.0000 ug | Freq: Once | EPIDURAL | Status: AC
  Administered 2017-05-23: 100 ug via INTRA_ARTICULAR
  Filled 2017-05-23: qty 1.5

## 2017-05-23 MED ORDER — BUPIVACAINE-EPINEPHRINE (PF) 0.5% -1:200000 IJ SOLN
INTRAMUSCULAR | Status: DC | PRN
  Administered 2017-05-23: 30 mL via PERINEURAL

## 2017-05-23 MED ORDER — EPHEDRINE SULFATE-NACL 50-0.9 MG/10ML-% IV SOSY
PREFILLED_SYRINGE | INTRAVENOUS | Status: DC | PRN
  Administered 2017-05-23 (×5): 10 mg via INTRAVENOUS

## 2017-05-23 MED ORDER — ALPRAZOLAM 0.25 MG PO TABS
0.2500 mg | ORAL_TABLET | Freq: Every day | ORAL | Status: DC | PRN
  Administered 2017-05-23: 0.25 mg via ORAL
  Filled 2017-05-23: qty 1

## 2017-05-23 MED ORDER — METOCLOPRAMIDE HCL 5 MG/ML IJ SOLN: 10.0000 mg | Freq: Once | INTRAMUSCULAR | Status: DC | PRN

## 2017-05-23 MED ORDER — FENTANYL CITRATE (PF) 250 MCG/5ML IJ SOLN
INTRAMUSCULAR | Status: AC
  Filled 2017-05-23: qty 5

## 2017-05-23 MED ORDER — TAMSULOSIN HCL 0.4 MG PO CAPS
0.4000 mg | ORAL_CAPSULE | Freq: Every day | ORAL | Status: DC
  Administered 2017-05-23 – 2017-05-24 (×2): 0.4 mg via ORAL
  Filled 2017-05-23 (×2): qty 1

## 2017-05-23 MED ORDER — FENTANYL CITRATE (PF) 100 MCG/2ML IJ SOLN
INTRAMUSCULAR | Status: AC
  Administered 2017-05-23: 100 ug
  Filled 2017-05-23: qty 2

## 2017-05-23 MED ORDER — SUGAMMADEX SODIUM 200 MG/2ML IV SOLN
INTRAVENOUS | Status: AC
  Filled 2017-05-23: qty 2

## 2017-05-23 MED ORDER — MENTHOL 3 MG MT LOZG: 1.0000 | LOZENGE | OROMUCOSAL | Status: DC | PRN

## 2017-05-23 MED ORDER — CEFAZOLIN SODIUM-DEXTROSE 2-4 GM/100ML-% IV SOLN
2.0000 g | Freq: Four times a day (QID) | INTRAVENOUS | Status: AC
  Administered 2017-05-23 – 2017-05-24 (×3): 2 g via INTRAVENOUS
  Filled 2017-05-23 (×3): qty 100

## 2017-05-23 MED ORDER — FLUVOXAMINE MALEATE 100 MG PO TABS
200.0000 mg | ORAL_TABLET | Freq: Every day | ORAL | Status: DC
  Administered 2017-05-23: 200 mg via ORAL
  Filled 2017-05-23: qty 2

## 2017-05-23 MED ORDER — ONDANSETRON HCL 4 MG/2ML IJ SOLN: 4.0000 mg | Freq: Four times a day (QID) | INTRAMUSCULAR | Status: DC | PRN

## 2017-05-23 MED ORDER — CEFAZOLIN SODIUM-DEXTROSE 2-4 GM/100ML-% IV SOLN
INTRAVENOUS | Status: AC
  Filled 2017-05-23: qty 100

## 2017-05-23 MED ORDER — ASPIRIN EC 325 MG PO TBEC
325.0000 mg | DELAYED_RELEASE_TABLET | Freq: Every day | ORAL | Status: DC
  Administered 2017-05-23 – 2017-05-24 (×2): 325 mg via ORAL
  Filled 2017-05-23 (×2): qty 1

## 2017-05-23 MED ORDER — EPINEPHRINE PF 1 MG/ML IJ SOLN
INTRAMUSCULAR | Status: AC
  Filled 2017-05-23: qty 1

## 2017-05-23 MED ORDER — GABAPENTIN 100 MG PO CAPS
100.0000 mg | ORAL_CAPSULE | Freq: Three times a day (TID) | ORAL | Status: DC
  Filled 2017-05-23 (×2): qty 1

## 2017-05-23 MED ORDER — ROCURONIUM BROMIDE 100 MG/10ML IV SOLN
INTRAVENOUS | Status: DC | PRN
Start: 2017-05-23 — End: 2017-05-23
  Administered 2017-05-23: 50 mg via INTRAVENOUS

## 2017-05-23 MED ORDER — 0.9 % SODIUM CHLORIDE (POUR BTL) OPTIME
TOPICAL | Status: DC | PRN
  Administered 2017-05-23 (×5): 1000 mL

## 2017-05-23 MED ORDER — ACETAMINOPHEN 650 MG RE SUPP: 650.0000 mg | Freq: Four times a day (QID) | RECTAL | Status: DC | PRN

## 2017-05-23 MED ORDER — METOCLOPRAMIDE HCL 5 MG PO TABS: 5.0000 mg | ORAL_TABLET | Freq: Three times a day (TID) | ORAL | Status: DC | PRN

## 2017-05-23 MED ORDER — OXYCODONE HCL 5 MG PO TABS
5.0000 mg | ORAL_TABLET | ORAL | Status: DC | PRN
  Administered 2017-05-24: 10 mg via ORAL
  Filled 2017-05-23: qty 2

## 2017-05-23 MED ORDER — BUPIVACAINE-EPINEPHRINE 0.5% -1:200000 IJ SOLN
INTRAMUSCULAR | Status: DC | PRN
  Administered 2017-05-23: 30 mL

## 2017-05-23 MED ORDER — PHENYLEPHRINE HCL 10 MG/ML IJ SOLN
INTRAMUSCULAR | Status: DC | PRN
  Administered 2017-05-23: 25 ug/min via INTRAVENOUS

## 2017-05-23 MED ORDER — FENTANYL CITRATE (PF) 100 MCG/2ML IJ SOLN: 25.0000 ug | INTRAMUSCULAR | Status: DC | PRN

## 2017-05-23 MED ORDER — MORPHINE SULFATE (PF) 4 MG/ML IV SOLN: 4.0000 mg | INTRAVENOUS | Status: DC | PRN

## 2017-05-23 MED ORDER — LACTATED RINGERS IV SOLN
INTRAVENOUS | Status: DC | PRN
  Administered 2017-05-23 (×2): via INTRAVENOUS

## 2017-05-23 MED ORDER — MEPERIDINE HCL 25 MG/ML IJ SOLN: 6.2500 mg | INTRAMUSCULAR | Status: DC | PRN

## 2017-05-23 MED ORDER — DOCUSATE SODIUM 100 MG PO CAPS
100.0000 mg | ORAL_CAPSULE | Freq: Two times a day (BID) | ORAL | Status: DC
  Administered 2017-05-23 – 2017-05-24 (×2): 100 mg via ORAL
  Filled 2017-05-23 (×2): qty 1

## 2017-05-23 SURGICAL SUPPLY — 67 items
BLADE SAW SGTL 13X75X1.27 (BLADE) ×2 IMPLANT
BONE CEMENT PALACOSE (Cement) ×2 IMPLANT
CAPT SHLDR TOTAL 2 ×2 IMPLANT
CEMENT BONE PALACOSE (Cement) ×1 IMPLANT
CLSR STERI-STRIP ANTIMIC 1/2X4 (GAUZE/BANDAGES/DRESSINGS) ×2 IMPLANT
COVER SURGICAL LIGHT HANDLE (MISCELLANEOUS) ×2 IMPLANT
DRAPE INCISE IOBAN 66X45 STRL (DRAPES) ×4 IMPLANT
DRAPE U-SHAPE 47X51 STRL (DRAPES) ×4 IMPLANT
DRSG AQUACEL AG ADV 3.5X10 (GAUZE/BANDAGES/DRESSINGS) ×2 IMPLANT
DURAPREP 26ML APPLICATOR (WOUND CARE) ×4 IMPLANT
ELECT BLADE 4.0 EZ CLEAN MEGAD (MISCELLANEOUS)
ELECT REM PT RETURN 9FT ADLT (ELECTROSURGICAL) ×2
ELECTRODE BLDE 4.0 EZ CLN MEGD (MISCELLANEOUS) IMPLANT
ELECTRODE REM PT RTRN 9FT ADLT (ELECTROSURGICAL) ×1 IMPLANT
GLENOID CT GUIDE AND BONE MODEL ×2 IMPLANT
GLOVE BIOGEL PI IND STRL 7.5 (GLOVE) ×1 IMPLANT
GLOVE BIOGEL PI IND STRL 8 (GLOVE) ×1 IMPLANT
GLOVE BIOGEL PI INDICATOR 7.5 (GLOVE) ×1
GLOVE BIOGEL PI INDICATOR 8 (GLOVE) ×1
GLOVE ECLIPSE 7.0 STRL STRAW (GLOVE) ×2 IMPLANT
GLOVE SURG ORTHO 8.0 STRL STRW (GLOVE) ×6 IMPLANT
GOWN STRL REUS W/ TWL LRG LVL3 (GOWN DISPOSABLE) ×4 IMPLANT
GOWN STRL REUS W/ TWL XL LVL3 (GOWN DISPOSABLE) ×2 IMPLANT
GOWN STRL REUS W/TWL LRG LVL3 (GOWN DISPOSABLE) ×4
GOWN STRL REUS W/TWL XL LVL3 (GOWN DISPOSABLE) ×2
KIT BASIN OR (CUSTOM PROCEDURE TRAY) ×2 IMPLANT
KIT BEACH CHAIR TRIMANO (MISCELLANEOUS) ×2 IMPLANT
KIT ROOM TURNOVER OR (KITS) ×2 IMPLANT
MANIFOLD NEPTUNE II (INSTRUMENTS) ×2 IMPLANT
NDL SUT 6 .5 CRC .975X.05 MAYO (NEEDLE) ×1 IMPLANT
NEEDLE 18GX1X1/2 (RX/OR ONLY) (NEEDLE) ×2 IMPLANT
NEEDLE HYPO 25GX1X1/2 BEV (NEEDLE) IMPLANT
NEEDLE MAYO TAPER (NEEDLE) ×1
NS IRRIG 1000ML POUR BTL (IV SOLUTION) ×10 IMPLANT
PACK SHOULDER (CUSTOM PROCEDURE TRAY) ×2 IMPLANT
PAD ARMBOARD 7.5X6 YLW CONV (MISCELLANEOUS) ×4 IMPLANT
PASSER SUT SWANSON 36MM LOOP (INSTRUMENTS) IMPLANT
PIN HUMERAL STMN 3.2MMX9IN (INSTRUMENTS) ×4 IMPLANT
RESTRAINT HEAD UNIVERSAL NS (MISCELLANEOUS) ×2 IMPLANT
SLING ARM IMMOBILIZER LRG (SOFTGOODS) ×2 IMPLANT
SMARTMIX MINI TOWER (MISCELLANEOUS) ×2
SPONGE LAP 18X18 X RAY DECT (DISPOSABLE) ×2 IMPLANT
STRIP CLOSURE SKIN 1/2X4 (GAUZE/BANDAGES/DRESSINGS) ×2 IMPLANT
SUCTION FRAZIER HANDLE 10FR (MISCELLANEOUS) ×1
SUCTION TUBE FRAZIER 10FR DISP (MISCELLANEOUS) ×1 IMPLANT
SUPPORT WRAP ARM LG (MISCELLANEOUS) ×2 IMPLANT
SUT FIBERWIRE #2 38 T-5 BLUE (SUTURE) ×2
SUT MAXBRAID (SUTURE) ×10 IMPLANT
SUT MNCRL AB 3-0 PS2 18 (SUTURE) ×2 IMPLANT
SUT VIC AB 0 CT1 27 (SUTURE) ×2
SUT VIC AB 0 CT1 27XBRD ANBCTR (SUTURE) ×2 IMPLANT
SUT VIC AB 1 CT1 27 (SUTURE) ×2
SUT VIC AB 1 CT1 27XBRD ANBCTR (SUTURE) ×2 IMPLANT
SUT VIC AB 2-0 CT1 27 (SUTURE) ×2
SUT VIC AB 2-0 CT1 TAPERPNT 27 (SUTURE) ×2 IMPLANT
SUT VICRYL 0 UR6 27IN ABS (SUTURE) ×4 IMPLANT
SUT VICRYL 4-0 PS2 18IN ABS (SUTURE) ×2 IMPLANT
SUT VICRYL 6 0 UNDY PS 6 (SUTURE) ×2 IMPLANT
SUTURE FIBERWR #2 38 T-5 BLUE (SUTURE) ×1 IMPLANT
SYR 20CC LL (SYRINGE) ×2 IMPLANT
SYR 3ML LL SCALE MARK (SYRINGE) IMPLANT
SYR CONTROL 10ML LL (SYRINGE) IMPLANT
TOWEL OR 17X24 6PK STRL BLUE (TOWEL DISPOSABLE) ×2 IMPLANT
TOWEL OR 17X26 10 PK STRL BLUE (TOWEL DISPOSABLE) ×2 IMPLANT
TOWER SMARTMIX MINI (MISCELLANEOUS) ×1 IMPLANT
TRAY FOLEY BAG SILVER LF 16FR (CATHETERS) ×2 IMPLANT
WATER STERILE IRR 1000ML POUR (IV SOLUTION) ×2 IMPLANT

## 2017-05-23 NOTE — Anesthesia Preprocedure Evaluation (Addendum)
Anesthesia Evaluation  Patient identified by MRN, date of birth, ID band Patient awake    Reviewed: Allergy & Precautions, NPO status , Patient's Chart, lab work & pertinent test results  History of Anesthesia Complications (+) PONV  Airway Mallampati: II  TM Distance: >3 FB Neck ROM: Full    Dental no notable dental hx.    Pulmonary neg pulmonary ROS,    Pulmonary exam normal breath sounds clear to auscultation       Cardiovascular negative cardio ROS Normal cardiovascular exam Rhythm:Regular Rate:Normal     Neuro/Psych negative neurological ROS  negative psych ROS   GI/Hepatic negative GI ROS, Neg liver ROS,   Endo/Other  negative endocrine ROS  Renal/GU negative Renal ROS  negative genitourinary   Musculoskeletal negative musculoskeletal ROS (+)   Abdominal   Peds negative pediatric ROS (+)  Hematology negative hematology ROS (+)   Anesthesia Other Findings   Reproductive/Obstetrics negative OB ROS                             Anesthesia Physical Anesthesia Plan  ASA: II  Anesthesia Plan: General   Post-op Pain Management:  Regional for Post-op pain and GA combined w/ Regional for post-op pain   Induction: Intravenous  Airway Management Planned: Oral ETT  Additional Equipment:   Intra-op Plan:   Post-operative Plan: Extubation in OR  Informed Consent: I have reviewed the patients History and Physical, chart, labs and discussed the procedure including the risks, benefits and alternatives for the proposed anesthesia with the patient or authorized representative who has indicated his/her understanding and acceptance.   Dental advisory given  Plan Discussed with: CRNA  Anesthesia Plan Comments: (SCB)        Anesthesia Quick Evaluation

## 2017-05-23 NOTE — H&P (Signed)
George Freeman is an 71 y.o. male.   Chief Complaint: Right shoulder pain HPI: Patient is a 71 year old male with right shoulder pain of many years duration.  He reports pain at night and pain at rest and pain with activities of daily living.  Localizes the pain to the glenohumeral joint.  Does not report much in the way of trauma.  Describes significant symptoms with any attempt at lifting.  MRI scan CT scan shows significant arthritis of the glenohumeral joint with intact rotator cuff.  Patient has failed all conservative measures and presents now for operative management after explanation of risks and benefits.  Past Medical History:  Diagnosis Date  . Anxiety   . Arthritis   . BPV (benign positional vertigo)   . Depression   . GERD (gastroesophageal reflux disease)   . Neuromuscular disorder (HCC)   . Panic disorder   . PONV (postoperative nausea and vomiting)    chills and anxiety after surgery    Past Surgical History:  Procedure Laterality Date  . ANAL FISSURE REPAIR  1993  . APPENDECTOMY  1969  . 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 BACK SURGERY  2011   Stenosis, sciatica  . BACK SURGERY  December 2014  . CERVICAL SPINE SURGERY  Feb. 2014  . CERVICAL SPINE SURGERY  07/23/15   fusion  . COLONOSCOPY    . KNEE SURGERY     2x- cartilage  . left foot surgery  1985  . Left shoulder surgery  1990  . Right shoulder surgery  2012  . TOTAL KNEE ARTHROPLASTY  January 2011  . TOTAL KNEE ARTHROPLASTY  Feb. 2012  . VASECTOMY      Family History  Problem Relation Age of Onset  . Diabetes Mother   . Alzheimer's disease Sister   . Acute myelogenous leukemia Unknown   . Neuropathy Neg Hx    Social History:  reports that he has never smoked. He has never used smokeless tobacco. He reports that he does not drink alcohol or use drugs.  Allergies:  Allergies  Allergen Reactions  . Amitriptyline     UNSPECIFIED REACTION   . Ciprofloxacin     UNSPECIFIED REACTION   .  Neurontin [Gabapentin] Anxiety  . Pregabalin Anxiety    Medications Prior to Admission  Medication Sig Dispense Refill  . ALPRAZolam (XANAX) 0.25 MG tablet Take 1 tablet (0.25 mg total) by mouth 4 (four) times daily. (Patient taking differently: Take 0.25 mg by mouth daily as needed for anxiety or sleep. ) 120 tablet 0  . fluvoxaMINE (LUVOX) 100 MG tablet Take 1 tablet (100 mg total) by mouth at bedtime. (Patient taking differently: Take 200 mg by mouth at bedtime. )  3  . ibuprofen (ADVIL,MOTRIN) 200 MG tablet Take 800 mg by mouth every 8 (eight) hours as needed for mild pain or moderate pain.    . Probiotic Product (ALIGN PO) Take 1 tablet by mouth daily.     . psyllium (REGULOID) 0.52 G capsule Take 2 capsules by mouth at bedtime.     . simvastatin (ZOCOR) 40 MG tablet Take 1 tablet (40 mg total) by mouth every evening. 30 tablet 3  . tadalafil (CIALIS) 5 MG tablet Take 5 mg by mouth daily.     . Tamsulosin HCl (FLOMAX) 0.4 MG CAPS Take 0.4 mg by mouth daily.    Marland Kitchen. gabapentin (NEURONTIN) 100 MG capsule Take 1 capsule (100 mg total) by mouth 3 (three) times  daily. 90 capsule 11    No results found for this or any previous visit (from the past 48 hour(s)). No results found.  Review of Systems  Musculoskeletal: Positive for joint pain.  All other systems reviewed and are negative.   Blood pressure 128/83, pulse 81, temperature 98.1 F (36.7 C), resp. rate 20, height 5\' 9"  (1.753 m), weight 204 lb (92.5 kg), SpO2 94 %. Physical Exam  Constitutional: He appears well-developed.  HENT:  Head: Normocephalic.  Eyes: Pupils are equal, round, and reactive to light.  Neck: Normal range of motion.  Cardiovascular: Normal rate.   Respiratory: Effort normal.  Neurological: He is alert.  Skin: Skin is warm.  Psychiatric: He has a normal mood and affect.   Examination the right shoulder demonstrates reasonable active and passive range of motion but external rotation at 15 abduction is only  about 40.  This compared to 60 the left-hand side.  There is some coarseness bone-on-bone grinding with passive range of motion but good rotator cuff strength to infraspinatus supraspinatus and subscap muscle testing.  Skin is intact in the shoulder girdle region.  Motor sensory function to the hand is intact.  Neck range of motion full.  Assessment/Plan Impression is right glenohumeral arthritis with intact rotator cuff and a 71 year old active patient.  Plan right shoulder replacement.  Risks and benefits discussed including not limited to infection nerve vessel damage shoulder instability potential need for revision for glenoid loosening.  Patient understands the risks and benefits and wishes to proceed.  All questions answered.  Plan to use patient's specific guides to optimize glenoid placement.  Burnard Bunting, MD 05/23/2017, 9:39 AM

## 2017-05-23 NOTE — Brief Op Note (Signed)
05/23/2017  4:03 PM  PATIENT:  George Freeman  71 y.o. male  PRE-OPERATIVE DIAGNOSIS:  RIGHT SHOULDER OSTEOARTHRITIS  POST-OPERATIVE DIAGNOSIS:  RIGHT SHOULDER OSTEOARTHRITIS  PROCEDURE:  Procedure(s): RIGHT TOTAL SHOULDER ARTHROPLASTY  SURGEON:  Surgeon(s): Cammy Copaean, Scott Jordis Repetto, MD  ASSISTANT: carla bethune rnfa  ANESTHESIA:   general  EBL: 350 ml    Total I/O In: 2200 [I.V.:1700; IV Piggyback:500] Out: 1200 [Urine:950; Blood:250]  BLOOD ADMINISTERED: none  DRAINS: none   LOCAL MEDICATIONS USED:  Marcaine mso4 clonidine   SPECIMEN:  No Specimen  COUNTS:  YES  TOURNIQUET:  * No tourniquets in log *  DICTATION: .Other Dictation: Dictation Number [id number] PLAN OF CARE: Admit for overnight observation  PATIENT DISPOSITION:  PACU - hemodynamically stable

## 2017-05-23 NOTE — Anesthesia Procedure Notes (Signed)
Procedure Name: Intubation Date/Time: 05/23/2017 10:28 AM Performed by: Kyung Rudd Pre-anesthesia Checklist: Patient identified, Emergency Drugs available, Suction available and Patient being monitored Patient Re-evaluated:Patient Re-evaluated prior to inductionOxygen Delivery Method: Circle system utilized Preoxygenation: Pre-oxygenation with 100% oxygen Intubation Type: IV induction Ventilation: Mask ventilation without difficulty and Oral airway inserted - appropriate to patient size Laryngoscope Size: Mac and 4 Grade View: Grade II Tube type: Oral Tube size: 7.5 mm Number of attempts: 1 Airway Equipment and Method: Stylet Placement Confirmation: ETT inserted through vocal cords under direct vision,  positive ETCO2 and breath sounds checked- equal and bilateral Secured at: 21 cm Tube secured with: Tape Dental Injury: Teeth and Oropharynx as per pre-operative assessment

## 2017-05-23 NOTE — Transfer of Care (Signed)
Immediate Anesthesia Transfer of Care Note  Patient: George Freeman  Procedure(s) Performed: Procedure(s): RIGHT TOTAL SHOULDER ARTHROPLASTY (Right)  Patient Location: PACU  Anesthesia Type:Gen   Level of Consciousness: awake, alert , oriented and patient cooperative  Airway & Oxygen Therapy: Patient Spontanous Breathing and Patient connected to nasal cannula oxygen  Post-op Assessment: Report given to RN and Post -op Vital signs reviewed and stable  Post vital signs: Reviewed and stable  Last Vitals:  Vitals:   05/23/17 0844 05/23/17 0943  BP: 128/83 133/81  Pulse: 81   Resp: 20   Temp: 36.7 C     Last Pain: There were no vitals filed for this visit.       Complications: No apparent anesthesia complications

## 2017-05-23 NOTE — Anesthesia Procedure Notes (Signed)
Anesthesia Regional Block: Supraclavicular block   Pre-Anesthetic Checklist: ,, timeout performed, Correct Patient, Correct Site, Correct Laterality, Correct Procedure, Correct Position, site marked, Risks and benefits discussed,  Surgical consent,  Pre-op evaluation,  At surgeon's request and post-op pain management  Laterality: Right and Upper  Prep: Maximum Sterile Barrier Precautions used, chloraprep       Needles:  Injection technique: Single-shot  Needle Type: Echogenic Stimulator Needle     Needle Length: 10cm      Additional Needles:   Procedures: ultrasound guided,,,,,,,,  Narrative:  Start time: 05/23/2017 9:41 AM End time: 05/23/2017 9:46 AM Injection made incrementally with aspirations every 5 mL.  Performed by: Personally  Anesthesiologist: Phillips GroutARIGNAN, Miyoko Hashimi  Additional Notes: Risks, benefits and alternative to block explained extensively.  Patient tolerated procedure well, without complications.

## 2017-05-24 ENCOUNTER — Encounter (HOSPITAL_COMMUNITY): Payer: Self-pay | Admitting: Orthopedic Surgery

## 2017-05-24 NOTE — Op Note (Signed)
NAME:  George Freeman, Stpehen              ACCOUNT NO.:  [id number]  MEDICAL RECORD NO.:  [id number]  LOCATION:                                 FACILITY:  PHYSICIAN:  Burnard BuntingG. Scott Michaeljames Milnes, M.D.         DATE OF BIRTH:  DATE OF PROCEDURE:  05/23/2017 DATE OF DISCHARGE:                              OPERATIVE REPORT   PREOPERATIVE DIAGNOSIS:  Right shoulder arthritis.  POSTOPERATIVE DIAGNOSIS:  Right shoulder arthritis.  PROCEDURE:  Right total shoulder replacement using Bi[employer] components, medium glenoid size 13 mini stem with 21 mm in height humeral head.  SURGEON:  Burnard BuntingG. Scott Adilen Pavelko, MD.  ASSISTANT:  Patrick Jupiterarla Bethune, RNFA.  INDICATIONS:  George Freeman is a 71 year old patient with right shoulder pain, has end-stage glenohumeral arthritis, who presents for operative management after explanation of risks and benefits.  PROCEDURE IN DETAIL:  The patient was brought to the operating room where general anesthetic was induced.  Preoperative antibiotics administered.  Time-out was called.  The patient was placed in the beach chair position with the head in neutral position.  Right arm was prescrubbed with alcohol and Betadine and allowed to air dry, prepped with DuraPrep solution and draped in sterile manner.  Collier Flowersoban was used to cover the entire operative field.  A deltopectoral approach was made. Skin and subcutaneous tissue were sharply divided.  The cephalic vein was mobilized medially.  The circumflex vessels were identified and suture ligatured.  At this time, with the Kolbel retractor in place, the axillary nerve was identified and a vessel loop was placed around it. It was protected at all times during the remaining portion of the case. At this time, the subscapularis was detached with leaving a rim of tissue for later reattachment.  The arm was progressively externally rotated and the capsule was released about 2 cm off the inferior humeral neck with the nerve protected.  This was done to about the 5  o'clock position on the right humeral head.  With the rotator cuff protected, entry was gained into the humeral shaft through the superior aspect of the humeral head about 1.5 cm posterior to the bicipital groove at the junction of the articular surface and rotator cuff footprint.  This was enlarged to accept up to a size 13.  The cut was made in approximately 25 degrees of retroversion.  The cuff was protected during this cut. Broaching was then performed and co-planing performed.  Size 13 mini broach gave good press-fit.  At this time, cap was placed onto the humerus.  Capsular release was taken down to the 7 o'clock position with no muscle relaxation and my index finger on top of the axillary nerve. Capsular release was performed.  Rotator interval was released to the base of the coracoid.  The capsule was then excised anteriorly to improve subscapularis excursion.  The anterior labrum was removed.  The glenoid was visualized using anterior and posterior retractors.  Using the model and the patient's specific guide, guide pin was placed in good position.  Reaming was performed with about 3 or 4 mm of primarily inferior and anterior reaming performed.  Bleeding bone was encountered. Guide pin was squarely within the  vault.  The central peg was then drilled and it stayed within the vault.  The superior and inferior pegs were then drilled.  The entire joint was then irrigated and the pegs were then cemented with press fitting of the central post.  This gave excellent fit.  Excess cement was removed.  At this time, attention was directed towards the humerus.  Marney Doctor was then placed back into the shaft and a 21 mm in height head was then applied.  The patient had 50% inferior and posterior translation with hand coming parallel to the torso with 90 degrees of abduction and internal rotation.  Shoulder overall had good stability.  Trial components were removed.  True components were placed  with good press-fit achieved.  The top of the head was only slightly elevated above the rotator cuff attachment.  At this time, thorough irrigation was performed.  Vancomycin powder placed in divided doses within the joint as well as within the incision anteriorly.  The subscapularis was then repaired using MaxBraid suture. This was done using 7 MaxBraid sutures.  Rotator interval repaired with the arm in external rotation using #1 Vicryl suture.  The deltopectoral groove was closed using #1 Vicryl suture with the cephalic vein preserved.  Then, the skin was closed using 0 Vicryl suture, 2-0 Vicryl suture, and a 3-0 Monocryl.  Aquacel dressing placed.  The patient tolerated the procedure well without immediate complications.  A vessel loop prior to closure was removed from the axillary nerve, which was palpable and intact.  The patient tolerated the procedure well without immediate complications.  Transferred to the recovery room in stable condition.     Burnard Bunting, M.D.   ______________________________ Reece Agar. Dorene Grebe, M.D.    GSD/MEDQ  D:  05/23/2017  T:  05/24/2017  Job:  [id number]

## 2017-05-24 NOTE — Progress Notes (Signed)
Subjective: Pt stable - block still in effect   Objective: Vital signs in last 24 hours: Temp:  [97.9 F (36.6 C)-98.9 F (37.2 C)] 98.2 F (36.8 C) (05/30 0803) Pulse Rate:  [77-101] 77 (05/30 0803) Resp:  [11-20] 16 (05/30 0803) BP: (103-133)/(59-93) 118/66 (05/30 0803) SpO2:  [93 %-96 %] 96 % (05/30 0803) Weight:  [204 lb (92.5 kg)] 204 lb (92.5 kg) (05/29 0917)  Intake/Output from previous day: 05/29 0701 - 05/30 0700 In: 2200 [I.V.:1700; IV Piggyback:500] Out: 3400 [Urine:3150; Blood:250] Intake/Output this shift: No intake/output data recorded.  Exam:  No cellulitis present  Labs: No results for input(s): HGB in the last 72 hours. No results for input(s): WBC, RBC, HCT, PLT in the last 72 hours. No results for input(s): NA, K, CL, CO2, BUN, CREATININE, GLUCOSE, CALCIUM in the last 72 hours. No results for input(s): LABPT, INR in the last 72 hours.  Assessment/Plan: Plan dc after lunch - ot in progress   G WellPointScott Monet North 05/24/2017, 8:38 AM

## 2017-05-24 NOTE — Evaluation (Signed)
Occupational Therapy Evaluation and Discharge Patient Details Name: George Freeman MRN: [id number] DOB: 04-Sep-1946 Today's Date: 05/24/2017    History of Present Illness Pt is a 71 y/o male s/p RIGHT total shoulder replacement. Pt has a past medical history including Anxiety; Arthritis; Depression; GERD; Panic disorder; Total knee arthroplasty (January 2011); Total knee arthroplasty (Feb. 2012); Back surgery (2009, 2011, 2014); Left shoulder surgery (1990); Right shoulder surgery (2012); and Cervical spine surgery (2014, 2016).   Clinical Impression   PTA Pt independent in ADL/IADL and mobility. Pt currently mod A for ADL that require BUE, and supervision for mobility. Pt fully educated in shoulder precautions, compensatory strategies, and exercises according to the passive protocol. Shoulder DC handout reviewed in full. Pt and wife with no questions or concerns at the end of the session. Education complete. OT to sign off with therapy to resume as ordered by the MD at the follow up. Thank you for this referral.     Follow Up Recommendations  No OT follow up;Supervision/Assistance - 24 hour (initially)    Equipment Recommendations  None recommended by OT    Recommendations for Other Services       Precautions / Restrictions Precautions Precautions: Shoulder Type of Shoulder Precautions: Passive Protocol Shoulder Interventions: Shoulder sling/immobilizer;At all times;Off for dressing/bathing/exercises Precaution Booklet Issued: Yes (comment) Precaution Comments: Shoulder DC handout reviewed in full Required Braces or Orthoses: Sling Restrictions Weight Bearing Restrictions: Yes RUE Weight Bearing: Non weight bearing      Mobility Bed Mobility Overal bed mobility: Modified Independent             General bed mobility comments: no assist needed, no attempt to push or pull with RUE  Transfers Overall transfer level: Modified independent Equipment used: None              General transfer comment: able to sit <>stand from bed and recliner with no problems    Balance Overall balance assessment: No apparent balance deficits (not formally assessed)                                         ADL either performed or assessed with clinical judgement   ADL Overall ADL's : Needs assistance/impaired                                       General ADL Comments: supervision for mobility during eval, please see shoulder section below for more information regarding education and compensatory strategies for shoulder     Vision Patient Visual Report: No change from baseline Vision Assessment?: No apparent visual deficits     Perception     Praxis      Pertinent Vitals/Pain Pain Assessment: No/denies pain (block still in place)     Hand Dominance Right   Extremity/Trunk Assessment Upper Extremity Assessment Upper Extremity Assessment: RUE deficits/detail RUE Deficits / Details: s/p surgery, block still in place currently RUE: Unable to fully assess due to immobilization RUE Sensation: decreased light touch (block still in place) RUE Coordination: decreased fine motor;decreased gross motor   Lower Extremity Assessment Lower Extremity Assessment: Overall WFL for tasks assessed   Cervical / Trunk Assessment Cervical / Trunk Assessment: Other exceptions Cervical / Trunk Exceptions: history of back surgeries; no complaints this session   Communication Communication Communication: No difficulties  Cognition Arousal/Alertness: Awake/alert Behavior During Therapy: WFL for tasks assessed/performed Overall Cognitive Status: Within Functional Limits for tasks assessed                                     General Comments  wife present for entire session, confirmed she received and understood all education    Exercises Exercises: Shoulder Shoulder Exercises Pendulum Exercise: Right;10  reps;Standing Shoulder Flexion: PROM;Right;10 reps;Supine (educated Pt to 90 degrees) Shoulder ABduction: PROM;Right;10 reps;Seated (Educated Pt to 60) Shoulder External Rotation: PROM;Right;10 reps;Seated (to 30 degrees) Elbow Flexion: AROM;Right;10 reps;Seated;Standing Elbow Extension: AROM;Right;10 reps;Seated;Standing Wrist Flexion: AROM;Right Wrist Extension: AROM;Right Digit Composite Flexion: AROM;Right Neck Flexion: AROM Neck Extension: AROM Neck Lateral Flexion - Right: AROM Neck Lateral Flexion - Left: AROM   Shoulder Instructions Shoulder Instructions Donning/doffing shirt without moving shoulder: Moderate assistance;Patient able to independently direct caregiver;Caregiver independent with task Method for sponge bathing under operated UE: Min-guard;Caregiver independent with task Donning/doffing sling/immobilizer: Maximal assistance;Caregiver independent with task;Patient able to independently direct caregiver Correct positioning of sling/immobilizer: Moderate assistance;Caregiver independent with task;Patient able to independently direct caregiver Pendulum exercises (written home exercise program): Supervision/safety ROM for elbow, wrist and digits of operated UE: Modified independent Sling wearing schedule (on at all times/off for ADL's): Modified independent Proper positioning of operated UE when showering: Minimal assistance Positioning of UE while sleeping: Set-up;Caregiver independent with task;Patient able to independently direct caregiver    Home Living Family/patient expects to be discharged to:: Private residence Living Arrangements: Spouse/significant other Available Help at Discharge: Family;Available 24 hours/day Type of Home: Other(Comment) (Townhome) Home Access: Stairs to enter [employer] of Steps: 3 Entrance Stairs-Rails: None       Bathroom Shower/Tub: Producer, television/film/video: Standard     Home Equipment: Shower seat - built  in          Prior Functioning/Environment Level of Independence: Independent        Comments: loves playing the guitar, walking, driving        OT Problem List: Decreased range of motion;Decreased activity tolerance;Decreased knowledge of precautions;Impaired UE functional use;Pain      OT Treatment/Interventions:      OT Goals(Current goals can be found in the care plan section) Acute Rehab OT Goals Patient Stated Goal: to play guitar at his family reunion OT Goal Formulation: With patient/family Time For Goal Achievement: 06/07/17 Potential to Achieve Goals: Good  OT Frequency:     Barriers to D/C:            Co-evaluation              AM-PAC PT "6 Clicks" Daily Activity     Outcome Measure Help from another person eating meals?: A Little Help from another person taking care of personal grooming?: A Little Help from another person toileting, which includes using toliet, bedpan, or urinal?: A Little Help from another person bathing (including washing, rinsing, drying)?: A Little Help from another person to put on and taking off regular upper body clothing?: A Lot Help from another person to put on and taking off regular lower body clothing?: A Little 6 Click Score: 17   End of Session Equipment Utilized During Treatment: Other (comment) (sling) Nurse Communication: Mobility status  Activity Tolerance: Patient tolerated treatment well Patient left: in chair;with call bell/phone within reach;with family/visitor present  OT Visit Diagnosis: Pain Pain - Right/Left: Right Pain - part of body:  Shoulder                Time: [id number] OT Time Calculation (min): 38 min Charges:  OT General Charges $OT Visit: 1 Procedure OT Evaluation $OT Eval Moderate Complexity: 1 Procedure OT Treatments $Self Care/Home Management : 8-22 mins $Therapeutic Exercise: 8-22 mins G-Codes:     Sherryl MangesLaura Aris Even OTR/L [phone] Evern BioLaura J Vendetta Pittinger 05/24/2017, 11:41 AM

## 2017-05-24 NOTE — Progress Notes (Signed)
Patient alert and oriented, mae's well, voiding adequate amount of urine, swallowing without difficulty, c/o moderate pain and meds given prior to discharged for ride and discomfort. Patient discharged home with family. Script and discharged instructions given to patient. Patient and family stated understanding of instructions given. Patient has an appointment June 11 with dr. August Saucerean.

## 2017-05-25 NOTE — Anesthesia Postprocedure Evaluation (Signed)
Anesthesia Post Note  Patient: George Freeman  Procedure(s) Performed: Procedure(s) (LRB): RIGHT TOTAL SHOULDER ARTHROPLASTY (Right)  Patient location during evaluation: PACU Anesthesia Type: General and Regional Level of consciousness: awake and alert Pain management: pain level controlled Vital Signs Assessment: post-procedure vital signs reviewed and stable Respiratory status: spontaneous breathing, nonlabored ventilation, respiratory function stable and patient connected to nasal cannula oxygen Cardiovascular status: blood pressure returned to baseline and stable Postop Assessment: no signs of nausea or vomiting Anesthetic complications: no       Last Vitals:  Vitals:   05/24/17 0415 05/24/17 0803  BP: 129/71 118/66  Pulse: 84 77  Resp: 18 16  Temp: 37 C 36.8 C    Last Pain:  Vitals:   05/24/17 1039  TempSrc:   PainSc: 6                  Ica Daye,JAMES TERRILL

## 2017-05-26 ENCOUNTER — Telehealth (INDEPENDENT_AMBULATORY_CARE_PROVIDER_SITE_OTHER): Payer: Self-pay | Admitting: Orthopedic Surgery

## 2017-05-26 NOTE — Telephone Encounter (Signed)
Patient's wife Kendal Hymen(Bonnie) called asked how to care for bandage on her husbands shoulder. She asked when can he take a shower. Junious Dresseronnie asked for clarification on instructions for care. The number to contact her is [phone]

## 2017-05-29 NOTE — Telephone Encounter (Signed)
Called LM returning call

## 2017-06-01 ENCOUNTER — Telehealth (INDEPENDENT_AMBULATORY_CARE_PROVIDER_SITE_OTHER): Payer: Self-pay | Admitting: Orthopedic Surgery

## 2017-06-01 NOTE — Telephone Encounter (Signed)
Patient returned your call. See note from him. Please advise.

## 2017-06-01 NOTE — Telephone Encounter (Signed)
Patient returned call to Dr August Saucerean. Patient said he has the CPM machine and is up to 80%. Patient asked if it is ok to continue to keep using it. Patient said he has been taking showers everyday and the bandage is still on. The number to contact patient is [phone]

## 2017-06-02 NOTE — Telephone Encounter (Signed)
IC s/w pt advised 

## 2017-06-02 NOTE — Telephone Encounter (Signed)
Don't go beyond 90 on the CPM machine please call thanks

## 2017-06-05 ENCOUNTER — Encounter (INDEPENDENT_AMBULATORY_CARE_PROVIDER_SITE_OTHER): Payer: Self-pay | Admitting: Orthopedic Surgery

## 2017-06-05 ENCOUNTER — Ambulatory Visit (INDEPENDENT_AMBULATORY_CARE_PROVIDER_SITE_OTHER): Payer: Medicare Other

## 2017-06-05 ENCOUNTER — Ambulatory Visit (INDEPENDENT_AMBULATORY_CARE_PROVIDER_SITE_OTHER): Payer: Medicare Other | Admitting: Orthopedic Surgery

## 2017-06-05 DIAGNOSIS — M19019 Primary osteoarthritis, unspecified shoulder: Secondary | ICD-10-CM

## 2017-06-06 ENCOUNTER — Encounter: Payer: Self-pay | Admitting: Physical Therapy

## 2017-06-06 ENCOUNTER — Ambulatory Visit: Payer: Medicare Other | Attending: Orthopedic Surgery | Admitting: Physical Therapy

## 2017-06-06 DIAGNOSIS — M25511 Pain in right shoulder: Secondary | ICD-10-CM | POA: Diagnosis not present

## 2017-06-06 DIAGNOSIS — M25611 Stiffness of right shoulder, not elsewhere classified: Secondary | ICD-10-CM | POA: Diagnosis present

## 2017-06-06 DIAGNOSIS — R2231 Localized swelling, mass and lump, right upper limb: Secondary | ICD-10-CM | POA: Insufficient documentation

## 2017-06-06 NOTE — Therapy (Signed)
[facility] [place] [address] [place], [place], [address] Phone: [phone]   Fax:  [phone]  Physical Therapy Evaluation  Patient Details  Name: George Freeman MRN: [id number] Date of Birth: 1946-06-26 Referring Provider: August Saucerean  Encounter Date: 06/06/2017      PT End of Session - 06/06/17 1637    Visit Number 1   Date for PT Re-Evaluation 08/06/17   PT Start Time 1605   PT Stop Time 1704   PT Time Calculation (min) 59 min   Activity Tolerance Patient tolerated treatment well   Behavior During Therapy [facility] for tasks assessed/performed      Past Medical History:  Diagnosis Date  . Anxiety   . Arthritis   . BPV (benign positional vertigo)   . Depression   . GERD (gastroesophageal reflux disease)   . Neuromuscular disorder (HCC)   . Panic disorder   . PONV (postoperative nausea and vomiting)    chills and anxiety after surgery    Past Surgical History:  Procedure Laterality Date  . ANAL FISSURE REPAIR  1993  . APPENDECTOMY  1969  . 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 BACK SURGERY  2011   Stenosis, sciatica  . BACK SURGERY  December 2014  . CERVICAL SPINE SURGERY  Feb. 2014  . CERVICAL SPINE SURGERY  07/23/15   fusion  . COLONOSCOPY    . KNEE SURGERY     2x- cartilage  . left foot surgery  1985  . Left shoulder surgery  1990  . Right shoulder surgery  2012  . TOTAL KNEE ARTHROPLASTY  January 2011  . TOTAL KNEE ARTHROPLASTY  Feb. 2012  . TOTAL SHOULDER ARTHROPLASTY Right 05/23/2017   Procedure: RIGHT TOTAL SHOULDER ARTHROPLASTY;  Surgeon: Cammy Copaean, Scott Gregory, MD;  [facility];  Service: Orthopedics;  Laterality: Right;  Marland Kitchen. VASECTOMY      There were no vitals filed for this visit.       Subjective Assessment - 06/06/17 1607    Subjective Reports he had years of right shoulder pain, he underwent a right TSR on 05/23/17.  Reports that he had a SPM at the house.  Reports that he has been  moving a little more but still reporting pain, was in a sling until yesterday   Limitations Lifting   Patient Stated Goals would like to be able to shoot a basketball, dress without difficulty   Currently in Pain? Yes   Pain Score 3    Pain Location Shoulder   Pain Orientation Right;Anterior;Posterior;Lateral   Pain Descriptors / Indicators Sore;Aching;Discomfort   Pain Type Surgical pain   Pain Onset More than a month ago   Pain Frequency Constant   Aggravating Factors  reaching out, back, pain up to 8/10   Pain Relieving Factors ice, rest and pain meds at best the pain is a 3/10   Effect of Pain on Daily Activities limits all ADL's, currently having assistance from his wife            [facility] PT Assessment - 06/06/17 0001      Assessment   Medical Diagnosis right TSR   Referring Provider Dean   Onset Date/Surgical Date 05/23/17   Prior Therapy none     Precautions   Precaution Comments no subscapularis resistance, no IR     Balance Screen   Has the patient fallen in the past 6 months No   Has the patient had  a decrease in activity level because of a fear of falling?  No   Is the patient reluctant to leave their home because of a fear of falling?  No     Home Environment   Additional Comments light housework     Prior Function   Level of Independence Independent   Vocation Retired   Leisure no exercise     Posture/Postural Control   Posture Comments gaurded posture, elevated shoulder, hold the arm close to his body     ROM / Strength   AROM / PROM / Strength PROM     PROM   PROM Assessment Site Shoulder   Right/Left Shoulder Right   Right Shoulder Flexion 92 Degrees   Right Shoulder ABduction 76 Degrees   Right Shoulder Internal Rotation 18 Degrees   Right Shoulder External Rotation 31 Degrees     Palpation   Palpation comment bruising in biceps area and the pec, gaurding and spasms in the upper trap and deltoid,             Objective measurements  completed on examination: See above findings.          [facility] Adult PT Treatment/Exercise - 06/06/17 0001      Modalities   Modalities Electrical Stimulation;Vasopneumatic     Programme researcher, broadcasting/film/video Location right shoulder   Electrical Stimulation Action IFC   Electrical Stimulation Parameters sitting   Electrical Stimulation Goals Pain     Vasopneumatic   Number Minutes Vasopneumatic  15 minutes   Vasopnuematic Location  Shoulder   Vasopneumatic Pressure Medium   Vasopneumatic Temperature  38                PT Education - 06/06/17 1636    Education provided Yes   Education Details PROM/AAROM    Person(s) Educated Patient   Methods Explanation;Demonstration;Handout   Comprehension Verbalized understanding;Returned demonstration;Verbal cues required          PT Short Term Goals - 06/06/17 1641      PT SHORT TERM GOAL #1   Title independent with initial HEP   Time 2   Period Weeks   Status New           PT Long Term Goals - 06/06/17 1641      PT LONG TERM GOAL #1   Title understand protocol and the progression   Time 12   Period Weeks   Status New     PT LONG TERM GOAL #2   Title decrease pain 50%   Time 12   Period Weeks   Status New     PT LONG TERM GOAL #3   Title increase AROM of right shoulder flexion to 110 degrees   Time 12   Period Weeks   Status New     PT LONG TERM GOAL #4   Title report no difficulty dressing or doing hair   Time 12   Period Weeks   Status New     PT LONG TERM GOAL #5   Title lift 3# to shoulder height shelf   Time 12   Period Weeks   Status New                Plan - 06/06/17 1637    Clinical Impression Statement Patient reports that he had years of right shoulder pain, he had a surgery about 8 years ago and has been having some injections since that time.  He underwent a right TSR  on 05/23/17, he was in a sling until yesterday.  Follow TSR protocol, specifically limit  any active subscapularis strengthening.   History and Personal Factors relevant to plan of care: Patient has had 15 othopedic surgeries   Clinical Presentation Evolving   Clinical Decision Making Moderate   Rehab Potential Good   PT Frequency 3x / week   PT Duration 8 weeks   PT Treatment/Interventions ADLs/Self Care Home Management;Cryotherapy;Electrical Stimulation;Moist Heat;Therapeutic activities;Therapeutic exercise;Neuromuscular re-education;Patient/family education;Manual techniques   PT Next Visit Plan follow protocol, working on PROM/AAROM, no strengthening    Consulted and Agree with Plan of Care Patient      Patient will benefit from skilled therapeutic intervention in order to improve the following deficits and impairments:  Decreased range of motion, Decreased strength, Increased edema, Postural dysfunction, Improper body mechanics, Pain, Increased muscle spasms, Impaired UE functional use  Visit Diagnosis: Acute pain of right shoulder - Plan: PT plan of care cert/re-cert  Stiffness of right shoulder, not elsewhere classified - Plan: PT plan of care cert/re-cert  Localized swelling, mass and lump, right upper limb - Plan: PT plan of care cert/re-cert      G-Codes - 19-Jun-2017 1646    Functional Assessment Tool Used (Outpatient Only) foto 72% limitation   Functional Limitation Other PT primary   Other PT Primary Current Status (W0981) At least 60 percent but less than 80 percent impaired, limited or restricted   Other PT Primary Goal Status (X9147) At least 40 percent but less than 60 percent impaired, limited or restricted       Problem List Patient Active Problem List   Diagnosis Date Noted  . Shoulder arthritis 05/23/2017  . Hereditary and idiopathic peripheral neuropathy 03/24/2015  . 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 Pain of right heel 12/18/2012    Jearld Lesch., PT 06-19-17, 4:57 PM  [facility] [place] [facility] [address] [place], [place], [address] Phone: [phone]   Fax:  [phone]  Name: George Freeman MRN: [id number] Date of Birth: 1946/12/22

## 2017-06-07 MED ORDER — IBUPROFEN 800 MG PO TABS: 800.0000 mg | ORAL_TABLET | Freq: Two times a day (BID) | ORAL | 0 refills | Status: AC

## 2017-06-07 NOTE — Progress Notes (Signed)
   Post-Op Visit Note   Patient: George Freeman           Date of Birth: 03-11-1946           MRN: [id number] Visit Date: 06/05/2017 PCP: Tracey HarriesBouska, David, MD   Assessment & Plan:  Chief Complaint:  Chief Complaint  Patient presents with  . Right Shoulder - Routine Post Op   Visit Diagnoses:  1. Shoulder arthritis     Plan: George Freeman is a 71 year old patient with right shoulder replacement 2 weeks out.  He's doing well.  Deltoid fires.  Motor sensory function to the hand is intact.  Passive range of motion is improving.  He is on the CPM machine at 90 and he will discontinue that at this time.  I'm going to discontinue the sling having take ibuprofen 800 mg twice a day 3 week return for clinical recheck  Follow-Up Instructions: Return in about 3 weeks (around 06/26/2017).   Orders:  Orders Placed This Encounter  Procedures  . XR Shoulder Right   No orders of the defined types were placed in this encounter.   Imaging: No results found.  PMFS History: Patient Active Problem List   Diagnosis Date Noted  . Shoulder arthritis 05/23/2017  . Hereditary and idiopathic peripheral neuropathy 03/24/2015  . 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 Pain of right heel 12/18/2012   Past Medical History:  Diagnosis Date  . Anxiety   . Arthritis   . BPV (benign positional vertigo)   . Depression   . GERD (gastroesophageal reflux disease)   . Neuromuscular disorder (HCC)   . Panic disorder   . PONV (postoperative nausea and vomiting)    chills and anxiety after surgery    Family History  Problem Relation Age of Onset  . Diabetes Mother   . Alzheimer's disease Sister   . Acute myelogenous leukemia Unknown   . Neuropathy Neg Hx     Past Surgical History:  Procedure Laterality Date  . ANAL FISSURE REPAIR  1993  . APPENDECTOMY  1969  . 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 BACK SURGERY  2011   Stenosis, sciatica  . BACK SURGERY  December 2014   . CERVICAL SPINE SURGERY  Feb. 2014  . CERVICAL SPINE SURGERY  07/23/15   fusion  . COLONOSCOPY    . KNEE SURGERY     2x- cartilage  . left foot surgery  1985  . Left shoulder surgery  1990  . Right shoulder surgery  2012  . TOTAL KNEE ARTHROPLASTY  January 2011  . TOTAL KNEE ARTHROPLASTY  Feb. 2012  . TOTAL SHOULDER ARTHROPLASTY Right 05/23/2017   Procedure: RIGHT TOTAL SHOULDER ARTHROPLASTY;  Surgeon: Cammy Copaean, Scott Gregory, MD;  [facility];  Service: Orthopedics;  Laterality: Right;  Marland Kitchen. VASECTOMY     Social History   Occupational History  . Retired    Social History Main Topics  . Smoking status: Never Smoker  . Smokeless tobacco: Never Used  . Alcohol use No  . Drug use: No  . Sexual activity: Not on file

## 2017-06-08 ENCOUNTER — Encounter: Payer: Self-pay | Admitting: Physical Therapy

## 2017-06-08 ENCOUNTER — Ambulatory Visit: Payer: Medicare Other | Admitting: Physical Therapy

## 2017-06-08 DIAGNOSIS — R2231 Localized swelling, mass and lump, right upper limb: Secondary | ICD-10-CM

## 2017-06-08 DIAGNOSIS — M25611 Stiffness of right shoulder, not elsewhere classified: Secondary | ICD-10-CM

## 2017-06-08 DIAGNOSIS — M25511 Pain in right shoulder: Secondary | ICD-10-CM

## 2017-06-08 NOTE — Discharge Summary (Signed)
Physician Discharge Summary  Patient ID: George Freeman MRN: [id number] DOB/AGE: May 03, 1946 71 y.o.  Admit date: 05/23/2017 Discharge date: 05/24/2017  Admission Diagnoses:  Active Problems:   Shoulder arthritis   Discharge Diagnoses:  Same  Surgeries: Procedure(s): RIGHT TOTAL SHOULDER ARTHROPLASTY on 05/23/2017   Consultants:   Discharged Condition: Stable  Hospital Course: George Freeman is an 71 y.o. male who was admitted 05/23/2017 with a chief complaint of right shoulder pain, and found to have a diagnosis of right shoulder arthritis.  They were brought to the operating room on 05/23/2017 and underwent the above named procedures.  Patient tolerated the procedure well and was started with occupational therapy on postop day #1.  He will continue to be maintained in the sling for the first postoperative week and will use a shoulder CPM machine at home.  He will follow-up with me in 7 days for reevaluation.  He is discharged home in good condition under appropriate pain control.  Antibiotics given:  Anti-infectives    Start     Dose/Rate Route Frequency Ordered Stop   05/23/17 1730  ceFAZolin (ANCEF) IVPB 2g/100 mL premix     2 g 200 mL/hr over 30 Minutes Intravenous Every 6 hours 05/23/17 1721 05/24/17 0508   05/23/17 1345  vancomycin (VANCOCIN) powder  Status:  Discontinued       As needed 05/23/17 1345 05/23/17 1534   05/23/17 0901  ceFAZolin (ANCEF) 2-4 GM/100ML-% IVPB    Comments:  Marrianne Mood   : cabinet override      05/23/17 0901 05/23/17 1030   05/23/17 0858  ceFAZolin (ANCEF) IVPB 2g/100 mL premix     2 g 200 mL/hr over 30 Minutes Intravenous On call to O.R. 05/23/17 8101 05/23/17 1040    .  Recent vital signs:  Vitals:   05/24/17 0415 05/24/17 0803  BP: 129/71 118/66  Pulse: 84 77  Resp: 18 16  Temp: 98.6 F (37 C) 98.2 F (36.8 C)    Recent laboratory studies:  Results for orders placed or performed during the hospital encounter of 05/16/17   Urine culture  Result Value Ref Range   Specimen Description URINE, CLEAN CATCH    Special Requests NONE    Culture <10,000 COLONIES/mL INSIGNIFICANT GROWTH (A)    Report Status 05/17/2017 FINAL   Surgical pcr screen  Result Value Ref Range   MRSA, PCR NEGATIVE NEGATIVE   Staphylococcus aureus NEGATIVE NEGATIVE  CBC  Result Value Ref Range   WBC 6.5 4.0 - 10.5 K/uL   RBC 5.71 4.22 - 5.81 MIL/uL   Hemoglobin 17.2 (H) 13.0 - 17.0 g/dL   HCT 75.1 02.5 - 85.2 %   MCV 88.3 78.0 - 100.0 fL   MCH 30.1 26.0 - 34.0 pg   MCHC 34.1 30.0 - 36.0 g/dL   RDW 77.8 24.2 - 35.3 %   Platelets 147 (L) 150 - 400 K/uL  Comprehensive metabolic panel  Result Value Ref Range   Sodium 141 135 - 145 mmol/L   Potassium 4.4 3.5 - 5.1 mmol/L   Chloride 109 101 - 111 mmol/L   CO2 26 22 - 32 mmol/L   Glucose, Bld 118 (H) 65 - 99 mg/dL   BUN 14 6 - 20 mg/dL   Creatinine, Ser 6.14 0.61 - 1.24 mg/dL   Calcium 9.3 8.9 - 43.1 mg/dL   Total Protein 6.7 6.5 - 8.1 g/dL   Albumin 4.2 3.5 - 5.0 g/dL   AST 28 15 - 41 U/L  ALT 31 17 - 63 U/L   Alkaline Phosphatase 68 38 - 126 U/L   Total Bilirubin 0.9 0.3 - 1.2 mg/dL   GFR calc non Af Amer >60 >60 mL/min   GFR calc Af Amer >60 >60 mL/min   Anion gap 6 5 - 15  Urinalysis, Routine w reflex microscopic  Result Value Ref Range   Color, Urine YELLOW YELLOW   APPearance CLEAR CLEAR   Specific Gravity, Urine 1.016 1.005 - 1.030   pH 5.0 5.0 - 8.0   Glucose, UA 50 (A) NEGATIVE mg/dL   Hgb urine dipstick NEGATIVE NEGATIVE   Bilirubin Urine NEGATIVE NEGATIVE   Ketones, ur NEGATIVE NEGATIVE mg/dL   Protein, ur NEGATIVE NEGATIVE mg/dL   Nitrite NEGATIVE NEGATIVE   Leukocytes, UA NEGATIVE NEGATIVE    Discharge Medications:   Allergies as of 05/24/2017      Reactions   Amitriptyline    UNSPECIFIED REACTION    Ciprofloxacin    UNSPECIFIED REACTION    Neurontin [gabapentin] Anxiety   Pregabalin Anxiety      Medication List    TAKE these medications    ALIGN PO Take 1 tablet by mouth daily.   ALPRAZolam 0.25 MG tablet Commonly known as:  XANAX Take 1 tablet (0.25 mg total) by mouth 4 (four) times daily. What changed:  when to take this  reasons to take this   fluvoxaMINE 100 MG tablet Commonly known as:  LUVOX Take 1 tablet (100 mg total) by mouth at bedtime. What changed:  how much to take   gabapentin 100 MG capsule Commonly known as:  NEURONTIN Take 1 capsule (100 mg total) by mouth 3 (three) times daily.   ibuprofen 200 MG tablet Commonly known as:  ADVIL,MOTRIN Take 800 mg by mouth every 8 (eight) hours as needed for mild pain or moderate pain.   psyllium 0.52 g capsule Commonly known as:  REGULOID Take 2 capsules by mouth at bedtime.   simvastatin 40 MG tablet Commonly known as:  ZOCOR Take 1 tablet (40 mg total) by mouth every evening.   tadalafil 5 MG tablet Commonly known as:  CIALIS Take 5 mg by mouth daily.   tamsulosin 0.4 MG Caps capsule Commonly known as:  FLOMAX Take 0.4 mg by mouth daily.       Diagnostic Studies: Dg Chest 2 View  Result Date: 05/16/2017 CLINICAL DATA:  Proper evaluation for RIGHT shoulder surgery EXAM: CHEST  2 VIEW COMPARISON:  None FINDINGS: Normal heart size, mediastinal contours, and pulmonary vascularity. Minimal peribronchial thickening with a small calcified granuloma in LEFT mid lung. Lungs otherwise clear. No pleural effusion or pneumothorax. Prior cervical spine fusion. IMPRESSION: Minimal bronchitic changes and old granulomatous disease. No acute abnormalities. Electronically Signed   By: Ulyses SouthwardMark  Boles M.D.   On: 05/16/2017 14:51   Dg Shoulder Right Port  Result Date: 05/23/2017 CLINICAL DATA:  71 year old male post shoulder replacement. Initial encounter. EXAM: PORTABLE RIGHT SHOULDER COMPARISON:  03/31/2017 CT shoulder. FINDINGS: Post right shoulder replacement without complication noted. Acromioclavicular joint degenerative changes. Mild pulmonary vascular prominence/  minimal atelectasis. IMPRESSION: Post right shoulder replacement without complication noted. Electronically Signed   By: Lacy DuverneySteven  Olson M.D.   On: 05/23/2017 17:16   Xr Shoulder Right  Result Date: 06/07/2017 AP lateral right shoulder reviewed.  Total shoulder prosthesis in good position and alignment with no complicating features.  Visualized lung fields clear   Disposition: 01-Home or Self Care  Discharge Instructions    Call MD /  Call 911    Complete by:  As directed    If you experience chest pain or shortness of breath, CALL 911 and be transported to the hospital emergency room.  If you develope a fever above 101 F, pus (white drainage) or increased drainage or redness at the wound, or calf pain, call your surgeon's office.   Constipation Prevention    Complete by:  As directed    Drink plenty of fluids.  Prune juice may be helpful.  You may use a stool softener, such as Colace (over the counter) 100 mg twice a day.  Use MiraLax (over the counter) for constipation as needed.   Diet - low sodium heart healthy    Complete by:  As directed    Discharge instructions    Complete by:  As directed    cpm 1 hour 3 times a day - beginning today Keep arm in sling Return in 1 week   Increase activity slowly as tolerated    Complete by:  As directed          Signed: Burnard Bunting 06/08/2017, 1:30 PM

## 2017-06-08 NOTE — Therapy (Signed)
[facility] [address] [place], [place], [address] Phone: [phone]   Fax:  [phone]  Physical Therapy Treatment  Patient Details  Name: George Freeman MRN: [id number] Date of Birth: [birth year] Referring Provider: August Saucerean  Encounter Date: 06/08/2017      PT End of Session - 06/08/17 0927    Visit Number 2   Date for PT Re-Evaluation 08/06/17   PT Start Time 0839   PT Stop Time 0940   PT Time Calculation (min) 61 min   Activity Tolerance Patient tolerated treatment well   Behavior During Therapy [facility] for tasks assessed/performed      Past Medical History:  Diagnosis Date  . Anxiety   . Arthritis   . BPV (benign positional vertigo)   . Depression   . GERD (gastroesophageal reflux disease)   . Neuromuscular disorder (HCC)   . Panic disorder   . PONV (postoperative nausea and vomiting)    chills and anxiety after surgery    Past Surgical History:  Procedure Laterality Date  . ANAL FISSURE REPAIR  1993  . APPENDECTOMY  1969  . 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 BACK SURGERY  2011   Stenosis, sciatica  . BACK SURGERY  December 2014  . CERVICAL SPINE SURGERY  Feb. 2014  . CERVICAL SPINE SURGERY  07/23/15   fusion  . COLONOSCOPY    . KNEE SURGERY     2x- cartilage  . left foot surgery  1985  . Left shoulder surgery  1990  . Right shoulder surgery  2012  . TOTAL KNEE ARTHROPLASTY  January 2011  . TOTAL KNEE ARTHROPLASTY  Feb. 2012  . TOTAL SHOULDER ARTHROPLASTY Right 05/23/2017   Procedure: RIGHT TOTAL SHOULDER ARTHROPLASTY;  Surgeon: Cammy Copaean, Scott Gregory, MD;  [facility];  Service: Orthopedics;  Laterality: Right;  Marland Kitchen. VASECTOMY      There were no vitals filed for this visit.      Subjective Assessment - 06/08/17 0841    Subjective Reports that he may have over done it with house stuff and computer work.  Reports that he has been very sore and went back in the sling to decrease the  pain   Currently in Pain? Yes   Pain Score 4    Pain Location Shoulder   Pain Orientation Right                         [facility] Adult PT Treatment/Exercise - 06/08/17 0001      Exercises   Exercises Shoulder     Shoulder Exercises: Standing   Other Standing Exercises wand exercises all motions   Other Standing Exercises 5# biceps, 20# triceps     Shoulder Exercises: ROM/Strengthening   UBE (Upper Arm Bike) level 1 x 4 minutes   Other ROM/Strengthening Exercises shrugs and scapular retraction, went over HEP and had him demonstrate   Other ROM/Strengthening Exercises biceps 3#, triceps 20#     Shoulder Exercises: Stretch   Table Stretch - Flexion 10 seconds;5 reps   Table Stretch - External Rotation 10 seconds;5 reps     Electrical Stimulation   Electrical Stimulation Location right shoulder   Electrical Stimulation Action IFC   Electrical Stimulation Parameters sitting   Electrical Stimulation Goals Pain     Vasopneumatic   Number Minutes Vasopneumatic  15 minutes   Vasopnuematic Location  Shoulder   Vasopneumatic Pressure Low  Vasopneumatic Temperature  45     Manual Therapy   Manual Therapy Passive ROM   Passive ROM PROM of the right shoulder all motions to pain                  PT Short Term Goals - 06/06/17 1641      PT SHORT TERM GOAL #1   Title independent with initial HEP   Time 2   Period Weeks   Status New           PT Long Term Goals - 06/06/17 1641      PT LONG TERM GOAL #1   Title understand protocol and the progression   Time 12   Period Weeks   Status New     PT LONG TERM GOAL #2   Title decrease pain 50%   Time 12   Period Weeks   Status New     PT LONG TERM GOAL #3   Title increase AROM of right shoulder flexion to 110 degrees   Time 12   Period Weeks   Status New     PT LONG TERM GOAL #4   Title report no difficulty dressing or doing hair   Time 12   Period Weeks   Status New     PT LONG TERM  GOAL #5   Title lift 3# to shoulder height shelf   Time 12   Period Weeks   Status New               Plan - 06/08/17 1610    Clinical Impression Statement Patient with pain at end range of motion, he is moving very well, some compensation with the upper trap   PT Next Visit Plan follow protocol, working on PROM/AAROM, no strengthening    Consulted and Agree with Plan of Care Patient      Patient will benefit from skilled therapeutic intervention in order to improve the following deficits and impairments:     Visit Diagnosis: Acute pain of right shoulder  Stiffness of right shoulder, not elsewhere classified  Localized swelling, mass and lump, right upper limb     Problem List Patient Active Problem List   Diagnosis Date Noted  . Shoulder arthritis 05/23/2017  . Hereditary and idiopathic peripheral neuropathy 03/24/2015  . 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 Pain of right heel 12/18/2012    Jearld Lesch., PT 06/08/2017, 9:28 AM  [facility] [address] [place], [place], [address] Phone: [phone]   Fax:  [phone]  Name: George Freeman MRN: [id number] Date of Birth: Mar 02, 1946

## 2017-06-13 ENCOUNTER — Encounter: Payer: Self-pay | Admitting: Physical Therapy

## 2017-06-13 ENCOUNTER — Ambulatory Visit: Payer: Medicare Other | Admitting: Physical Therapy

## 2017-06-13 DIAGNOSIS — M25511 Pain in right shoulder: Secondary | ICD-10-CM

## 2017-06-13 DIAGNOSIS — R2231 Localized swelling, mass and lump, right upper limb: Secondary | ICD-10-CM

## 2017-06-13 DIAGNOSIS — M25611 Stiffness of right shoulder, not elsewhere classified: Secondary | ICD-10-CM

## 2017-06-13 NOTE — Therapy (Signed)
[facility] [address] [place], [place], [address] Phone: [phone]   Fax:  [phone]  Physical Therapy Treatment  Patient Details  Name: George Freeman J Mattia MRN: [id number] Date of Birth: [birth year] Referring Provider: August Saucerean  Encounter Date: 06/13/2017      PT End of Session - 06/13/17 0916    Visit Number 3   Date for PT Re-Evaluation 08/06/17   PT Start Time 0838   PT Stop Time 0927   PT Time Calculation (min) 49 min   Activity Tolerance Patient tolerated treatment well   Behavior During Therapy [facility] for tasks assessed/performed      Past Medical History:  Diagnosis Date  . Anxiety   . Arthritis   . BPV (benign positional vertigo)   . Depression   . GERD (gastroesophageal reflux disease)   . Neuromuscular disorder (HCC)   . Panic disorder   . PONV (postoperative nausea and vomiting)    chills and anxiety after surgery    Past Surgical History:  Procedure Laterality Date  . ANAL FISSURE REPAIR  1993  . APPENDECTOMY  1969  . 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 BACK SURGERY  2011   Stenosis, sciatica  . BACK SURGERY  December 2014  . CERVICAL SPINE SURGERY  Feb. 2014  . CERVICAL SPINE SURGERY  07/23/15   fusion  . COLONOSCOPY    . KNEE SURGERY     2x- cartilage  . left foot surgery  1985  . Left shoulder surgery  1990  . Right shoulder surgery  2012  . TOTAL KNEE ARTHROPLASTY  January 2011  . TOTAL KNEE ARTHROPLASTY  Feb. 2012  . TOTAL SHOULDER ARTHROPLASTY Right 05/23/2017   Procedure: RIGHT TOTAL SHOULDER ARTHROPLASTY;  Surgeon: Cammy Copaean, Scott Gregory, MD;  [facility];  Service: Orthopedics;  Laterality: Right;  Marland Kitchen. VASECTOMY      There were no vitals filed for this visit.      Subjective Assessment - 06/13/17 0837    Subjective Patient reports feeling pretty good, some soreness   Currently in Pain? Yes   Pain Score 1    Pain Location Shoulder   Pain Orientation Right                          [facility] Adult PT Treatment/Exercise - 06/13/17 0001      Shoulder Exercises: Standing   Extension 20 reps;Theraband   Theraband Level (Shoulder Extension) Level 2 (Red)   Row 20 reps;Theraband   Theraband Level (Shoulder Row) Level 2 (Red)   Other Standing Exercises wand exercises all motions, ball rolling on mat table   Other Standing Exercises 5# biceps, 20# triceps     Shoulder Exercises: ROM/Strengthening   UBE (Upper Arm Bike) level 1 x 6 minutes   Wall Wash flexion x10     Shoulder Exercises: Isometric Strengthening   Extension 5X10"   Internal Rotation 5X10"   ADduction 5X10"     Shoulder Exercises: Stretch   Corner Stretch 3 reps;10 seconds   Table Stretch - Flexion 10 seconds;5 reps   Table Stretch - External Rotation 10 seconds;5 reps     Electrical Stimulation   Electrical Stimulation Location right shoulder   Electrical Stimulation Action IFC   Electrical Stimulation Parameters sitting   Electrical Stimulation Goals Pain     Vasopneumatic   Number Minutes Vasopneumatic  15 minutes   Vasopnuematic Location  Shoulder   Vasopneumatic Pressure Low   Vasopneumatic Temperature  39                  PT Short Term Goals - 06/13/17 0919      PT SHORT TERM GOAL #1   Title independent with initial HEP   Status Achieved           PT Long Term Goals - 06/06/17 1641      PT LONG TERM GOAL #1   Title understand protocol and the progression   Time 12   Period Weeks   Status New     PT LONG TERM GOAL #2   Title decrease pain 50%   Time 12   Period Weeks   Status New     PT LONG TERM GOAL #3   Title increase AROM of right shoulder flexion to 110 degrees   Time 12   Period Weeks   Status New     PT LONG TERM GOAL #4   Title report no difficulty dressing or doing hair   Time 12   Period Weeks   Status New     PT LONG TERM GOAL #5   Title lift 3# to shoulder height shelf   Time 12   Period Weeks    Status New               Plan - 06/13/17 0917    Clinical Impression Statement Very tight anterior with corner and doorway stretch, otherwise improving as expected witht he surgery he has had   PT Next Visit Plan measure AROM   Consulted and Agree with Plan of Care Patient      Patient will benefit from skilled therapeutic intervention in order to improve the following deficits and impairments:  Decreased range of motion, Decreased strength, Increased edema, Postural dysfunction, Improper body mechanics, Pain, Increased muscle spasms, Impaired UE functional use  Visit Diagnosis: Acute pain of right shoulder  Stiffness of right shoulder, not elsewhere classified  Localized swelling, mass and lump, right upper limb     Problem List Patient Active Problem List   Diagnosis Date Noted  . Shoulder arthritis 05/23/2017  . Hereditary and idiopathic peripheral neuropathy 03/24/2015  . 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 Pain of right heel 12/18/2012    Jearld Lesch., PT 06/13/2017, 9:20 AM  [facility] [address] [place], [place], [address] Phone: [phone]   Fax:  [phone]  Name: ADRYAN Freeman MRN: [id number] Date of Birth: 06-Mar-1946

## 2017-06-14 ENCOUNTER — Ambulatory Visit: Payer: Medicare Other | Admitting: Physical Therapy

## 2017-06-16 ENCOUNTER — Ambulatory Visit: Payer: Medicare Other | Admitting: Physical Therapy

## 2017-06-16 ENCOUNTER — Encounter: Payer: Self-pay | Admitting: Physical Therapy

## 2017-06-16 DIAGNOSIS — M25511 Pain in right shoulder: Secondary | ICD-10-CM

## 2017-06-16 DIAGNOSIS — M25611 Stiffness of right shoulder, not elsewhere classified: Secondary | ICD-10-CM

## 2017-06-16 DIAGNOSIS — R2231 Localized swelling, mass and lump, right upper limb: Secondary | ICD-10-CM

## 2017-06-16 NOTE — Therapy (Signed)
[facility] [address] [place], [place], [address] Phone: [phone]   Fax:  [phone]  Physical Therapy Treatment  Patient Details  Name: George Freeman MRN: [id number] Date of Birth: 10/18/1946 Referring Provider: August Saucerean  Encounter Date: 06/16/2017      PT End of Session - 06/16/17 1030    Visit Number 4   Date for PT Re-Evaluation 08/06/17   PT Start Time 0929   PT Stop Time 1030   PT Time Calculation (min) 61 min   Activity Tolerance Patient tolerated treatment well   Behavior During Therapy [facility] for tasks assessed/performed      Past Medical History:  Diagnosis Date  . Anxiety   . Arthritis   . BPV (benign positional vertigo)   . Depression   . GERD (gastroesophageal reflux disease)   . Neuromuscular disorder (HCC)   . Panic disorder   . PONV (postoperative nausea and vomiting)    chills and anxiety after surgery    Past Surgical History:  Procedure Laterality Date  . ANAL FISSURE REPAIR  1993  . APPENDECTOMY  1969  . 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 BACK SURGERY  2011   Stenosis, sciatica  . BACK SURGERY  December 2014  . CERVICAL SPINE SURGERY  Feb. 2014  . CERVICAL SPINE SURGERY  07/23/15   fusion  . COLONOSCOPY    . KNEE SURGERY     2x- cartilage  . left foot surgery  1985  . Left shoulder surgery  1990  . Right shoulder surgery  2012  . TOTAL KNEE ARTHROPLASTY  January 2011  . TOTAL KNEE ARTHROPLASTY  Feb. 2012  . TOTAL SHOULDER ARTHROPLASTY Right 05/23/2017   Procedure: RIGHT TOTAL SHOULDER ARTHROPLASTY;  Surgeon: Cammy Copaean, Scott Gregory, MD;  [facility];  Service: Orthopedics;  Laterality: Right;  Marland Kitchen. VASECTOMY      There were no vitals filed for this visit.      Subjective Assessment - 06/16/17 0939    Subjective I was a little sore after being on the computer "too much " yesterday.   Currently in Pain? Yes   Pain Score 3    Pain Location Shoulder   Pain  Orientation Right            [facility] PT Assessment - 06/16/17 0001      PROM   Right Shoulder Flexion 110 Degrees   Right Shoulder ABduction 95 Degrees   Right Shoulder Internal Rotation 30 Degrees   Right Shoulder External Rotation 45 Degrees                     [facility] Adult PT Treatment/Exercise - 06/16/17 0001      Shoulder Exercises: Supine   External Rotation 20 reps   Internal Rotation 20 reps   Flexion AAROM;20 reps     Shoulder Exercises: Standing   Extension 20 reps;Theraband   Theraband Level (Shoulder Extension) Level 2 (Red)   Theraband Level (Shoulder Row) Level 2 (Red)   Other Standing Exercises wand exercises all motions, ball rolling on mat table     Shoulder Exercises: ROM/Strengthening   UBE (Upper Arm Bike) level 1 x 6 minutes   Wall Wash flexion, CW/CCW circles x10   Other ROM/Strengthening Exercises shrugs and scapular retraction, went over HEP and had him demonstrate     Shoulder Exercises: Stretch   Corner Stretch 3 reps;10 seconds  Environmental consultant IFC   Electrical Stimulation Parameters sittingsitting   Electrical Stimulation Goals Pain     Vasopneumatic   Number Minutes Vasopneumatic  15 minutes   Vasopnuematic Location  Shoulder   Vasopneumatic Pressure Low   Vasopneumatic Temperature  40     Manual Therapy   Manual Therapy Passive ROM   Passive ROM PROM of the right shoulder all motions to pain                  PT Short Term Goals - 06/13/17 0919      PT SHORT TERM GOAL #1   Title independent with initial HEP   Status Achieved           PT Long Term Goals - 06/06/17 1641      PT LONG TERM GOAL #1   Title understand protocol and the progression   Time 12   Period Weeks   Status New     PT LONG TERM GOAL #2   Title decrease pain 50%   Time 12   Period Weeks   Status New     PT LONG TERM GOAL #3   Title  increase AROM of right shoulder flexion to 110 degrees   Time 12   Period Weeks   Status New     PT LONG TERM GOAL #4   Title report no difficulty dressing or doing hair   Time 12   Period Weeks   Status New     PT LONG TERM GOAL #5   Title lift 3# to shoulder height shelf   Time 12   Period Weeks   Status New               Plan - 06/16/17 1031    Clinical Impression Statement Patient ROM is progressing well, has very tight pectoral as well as tight right wrist flexors.   PT Next Visit Plan measure AROM   Consulted and Agree with Plan of Care Patient      Patient will benefit from skilled therapeutic intervention in order to improve the following deficits and impairments:  Decreased range of motion, Decreased strength, Increased edema, Postural dysfunction, Improper body mechanics, Pain, Increased muscle spasms, Impaired UE functional use  Visit Diagnosis: Acute pain of right shoulder  Stiffness of right shoulder, not elsewhere classified  Localized swelling, mass and lump, right upper limb     Problem List Patient Active Problem List   Diagnosis Date Noted  . Shoulder arthritis 05/23/2017  . Hereditary and idiopathic peripheral neuropathy 03/24/2015  . 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 Pain of right heel 12/18/2012    Jearld Lesch., PT 06/16/2017, 10:32 AM  [facility] [address] [place], [place], [address] Phone: [phone]   Fax:  [phone]  Name: George Freeman MRN: [id number] Date of Birth: 1946-02-26

## 2017-06-19 ENCOUNTER — Encounter: Payer: Self-pay | Admitting: Physical Therapy

## 2017-06-19 ENCOUNTER — Ambulatory Visit: Payer: Medicare Other | Admitting: Physical Therapy

## 2017-06-19 DIAGNOSIS — M25511 Pain in right shoulder: Secondary | ICD-10-CM | POA: Diagnosis not present

## 2017-06-19 DIAGNOSIS — M25611 Stiffness of right shoulder, not elsewhere classified: Secondary | ICD-10-CM

## 2017-06-19 DIAGNOSIS — R2231 Localized swelling, mass and lump, right upper limb: Secondary | ICD-10-CM

## 2017-06-19 NOTE — Therapy (Signed)
[facility] [address] [place], [place], [address] Phone: [phone]   Fax:  [phone]  Physical Therapy Treatment  Patient Details  Name: George Freeman MRN: [id number] Date of Birth: 04-Jan-1946 Referring Provider: August Saucerean  Encounter Date: 06/19/2017      PT End of Session - 06/19/17 1515    Visit Number 5   Date for PT Re-Evaluation 08/06/17   PT Start Time 1438   PT Stop Time 1540   PT Time Calculation (min) 62 min   Activity Tolerance Patient tolerated treatment well   Behavior During Therapy [facility] for tasks assessed/performed      Past Medical History:  Diagnosis Date  . Anxiety   . Arthritis   . BPV (benign positional vertigo)   . Depression   . GERD (gastroesophageal reflux disease)   . Neuromuscular disorder (HCC)   . Panic disorder   . PONV (postoperative nausea and vomiting)    chills and anxiety after surgery    Past Surgical History:  Procedure Laterality Date  . ANAL FISSURE REPAIR  1993  . APPENDECTOMY  1969  . 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 BACK SURGERY  2011   Stenosis, sciatica  . BACK SURGERY  December 2014  . CERVICAL SPINE SURGERY  Feb. 2014  . CERVICAL SPINE SURGERY  07/23/15   fusion  . COLONOSCOPY    . KNEE SURGERY     2x- cartilage  . left foot surgery  1985  . Left shoulder surgery  1990  . Right shoulder surgery  2012  . TOTAL KNEE ARTHROPLASTY  January 2011  . TOTAL KNEE ARTHROPLASTY  Feb. 2012  . TOTAL SHOULDER ARTHROPLASTY Right 05/23/2017   Procedure: RIGHT TOTAL SHOULDER ARTHROPLASTY;  Surgeon: Cammy Copaean, Scott Gregory, MD;  [facility];  Service: Orthopedics;  Laterality: Right;  Marland Kitchen. VASECTOMY      There were no vitals filed for this visit.      Subjective Assessment - 06/19/17 1446    Subjective Patient continues to c/o soreness, reports that he may be using it too much   Currently in Pain? Yes   Pain Score 4    Pain Location Shoulder   Pain  Orientation Right                         [facility] Adult PT Treatment/Exercise - 06/19/17 0001      Shoulder Exercises: ROM/Strengthening   UBE (Upper Arm Bike) level 1 x 6 minutes   Wall Wash flexion, CW/CCW circles x10   Other ROM/Strengthening Exercises shrugs and scapular retraction, went over HEP and had him demonstrate     Shoulder Exercises: Stretch   Corner Stretch 3 reps;10 seconds     Electrical Stimulation   Electrical Stimulation Location right shoulder   Electrical Stimulation Action IFC   Electrical Stimulation Parameters sitting   Electrical Stimulation Goals Pain     Vasopneumatic   Number Minutes Vasopneumatic  15 minutes   Vasopnuematic Location  Shoulder   Vasopneumatic Pressure Low   Vasopneumatic Temperature  40     Manual Therapy   Manual Therapy Passive ROM   Passive ROM PROM of the right shoulder all motions to pain, some pectoral stretches, and forearm stretches                  PT Short Term Goals - 06/13/17 [id number]  PT SHORT TERM GOAL #1   Title independent with initial HEP   Status Achieved           PT Long Term Goals - 06/06/17 1641      PT LONG TERM GOAL #1   Title understand protocol and the progression   Time 12   Period Weeks   Status New     PT LONG TERM GOAL #2   Title decrease pain 50%   Time 12   Period Weeks   Status New     PT LONG TERM GOAL #3   Title increase AROM of right shoulder flexion to 110 degrees   Time 12   Period Weeks   Status New     PT LONG TERM GOAL #4   Title report no difficulty dressing or doing hair   Time 12   Period Weeks   Status New     PT LONG TERM GOAL #5   Title lift 3# to shoulder height shelf   Time 12   Period Weeks   Status New               Plan - 06/19/17 1515    Clinical Impression Statement Continues to be tight with ER and horizontal abduction.  Other motions are very good.  Backed off of the exercise today secondary to pain/soreness    PT Next Visit Plan Measure AROM   Consulted and Agree with Plan of Care Patient      Patient will benefit from skilled therapeutic intervention in order to improve the following deficits and impairments:  Decreased range of motion, Decreased strength, Increased edema, Postural dysfunction, Improper body mechanics, Pain, Increased muscle spasms, Impaired UE functional use  Visit Diagnosis: Acute pain of right shoulder  Stiffness of right shoulder, not elsewhere classified  Localized swelling, mass and lump, right upper limb     Problem List Patient Active Problem List   Diagnosis Date Noted  . Shoulder arthritis 05/23/2017  . Hereditary and idiopathic peripheral neuropathy 03/24/2015  . 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 Pain of right heel 12/18/2012    Jearld Lesch., PT 06/19/2017, 3:28 PM  [facility] [address] [place], [place], [address] Phone: [phone]   Fax:  [phone]  Name: George Freeman MRN: [id number] Date of Birth: [birth year]

## 2017-06-21 ENCOUNTER — Ambulatory Visit: Payer: Medicare Other | Admitting: Physical Therapy

## 2017-06-21 ENCOUNTER — Encounter: Payer: Self-pay | Admitting: Physical Therapy

## 2017-06-21 DIAGNOSIS — M25611 Stiffness of right shoulder, not elsewhere classified: Secondary | ICD-10-CM

## 2017-06-21 DIAGNOSIS — R2231 Localized swelling, mass and lump, right upper limb: Secondary | ICD-10-CM

## 2017-06-21 DIAGNOSIS — M25511 Pain in right shoulder: Secondary | ICD-10-CM | POA: Diagnosis not present

## 2017-06-21 NOTE — Therapy (Signed)
[facility] [address] [place], [place], [address] Phone: [phone]   Fax:  [phone]  Physical Therapy Treatment  Patient Details  Name: George Freeman MRN: [id number] Date of Birth: 1946/09/26 Referring Provider: August Saucer  Encounter Date: 06/21/2017      PT End of Session - 06/21/17 1009    Visit Number 6   Date for PT Re-Evaluation 08/06/17   PT Start Time 0926   PT Stop Time 1027   PT Time Calculation (min) 61 min   Activity Tolerance Patient tolerated treatment well   Behavior During Therapy [facility] for tasks assessed/performed      Past Medical History:  Diagnosis Date  . Anxiety   . Arthritis   . BPV (benign positional vertigo)   . Depression   . GERD (gastroesophageal reflux disease)   . Neuromuscular disorder (HCC)   . Panic disorder   . PONV (postoperative nausea and vomiting)    chills and anxiety after surgery    Past Surgical History:  Procedure Laterality Date  . ANAL FISSURE REPAIR  1993  . APPENDECTOMY  1969  . 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 BACK SURGERY  2011   Stenosis, sciatica  . BACK SURGERY  December 2014  . CERVICAL SPINE SURGERY  Feb. 2014  . CERVICAL SPINE SURGERY  07/23/15   fusion  . COLONOSCOPY    . KNEE SURGERY     2x- cartilage  . left foot surgery  1985  . Left shoulder surgery  1990  . Right shoulder surgery  2012  . TOTAL KNEE ARTHROPLASTY  January 2011  . TOTAL KNEE ARTHROPLASTY  Feb. 2012  . TOTAL SHOULDER ARTHROPLASTY Right 05/23/2017   Procedure: RIGHT TOTAL SHOULDER ARTHROPLASTY;  Surgeon: Cammy Copa, MD;  [facility] OR;  Service: Orthopedics;  Laterality: Right;  Marland Kitchen VASECTOMY      There were no vitals filed for this visit.      Subjective Assessment - 06/21/17 0926    Subjective Patient reports that he is feeling better today.  REports that he iced the arm yesterday and thinks it helps   Currently in Pain? Yes   Pain Score 2     Pain Location Shoulder   Pain Orientation Right                         [facility] Adult PT Treatment/Exercise - 06/21/17 0001      Shoulder Exercises: Supine   Protraction Weight (lbs) Punch with #1   External Rotation 20 reps  at 45 degrees abduction with 1#   Other Supine Exercises isometric circles with 1#  Isometric circles 1#, 10 reps CW and CCW     Shoulder Exercises: Standing   External Rotation 20 reps  ROM limited by PT   Theraband Level (Shoulder External Rotation) Level 1 (Yellow)   Extension 20 reps;Theraband   Theraband Level (Shoulder Extension) Level 2 (Red)   Other Standing Exercises wand exercises all motions,   Other Standing Exercises 20# triceps     Shoulder Exercises: ROM/Strengthening   UBE (Upper Arm Bike) level 4 x 6 minutes   Wall Wash flexion, CW/CCW circles x10, ball rolling up wall,    Wall Pushups 20 reps  with ball, small ROM   "W" Arms 15 reps   Other ROM/Strengthening Exercises shrugs and scapular retraction, went over HEP and had him demonstrate   Other  ROM/Strengthening Exercises  triceps 20#     Shoulder Exercises: Stretch   Corner Stretch 4 reps;10 seconds     Electrical Stimulation   Electrical Stimulation Location right shoulder   Electrical Stimulation Action IFC   Electrical Stimulation Parameters sitting   Electrical Stimulation Goals Pain     Vasopneumatic   Number Minutes Vasopneumatic  15 minutes   Vasopnuematic Location  Shoulder   Vasopneumatic Pressure Low   Vasopneumatic Temperature  40     Manual Therapy   Manual Therapy Passive ROM   Passive ROM PROM of the right shoulder all motions to pain, some pectoral stretches, and forearm stretches                  PT Short Term Goals - 06/13/17 0919      PT SHORT TERM GOAL #1   Title independent with initial HEP   Status Achieved           PT Long Term Goals - 06/06/17 1641      PT LONG TERM GOAL #1   Title understand protocol and the  progression   Time 12   Period Weeks   Status New     PT LONG TERM GOAL #2   Title decrease pain 50%   Time 12   Period Weeks   Status New     PT LONG TERM GOAL #3   Title increase AROM of right shoulder flexion to 110 degrees   Time 12   Period Weeks   Status New     PT LONG TERM GOAL #4   Title report no difficulty dressing or doing hair   Time 12   Period Weeks   Status New     PT LONG TERM GOAL #5   Title lift 3# to shoulder height shelf   Time 12   Period Weeks   Status New               Plan - 06/21/17 1010    Clinical Impression Statement Feeling better with us doing a little lighter exercise.  His AAROM is very good, most limited with abduction and ER   PT Next Visit Plan Measure AROM write MD note   Consulted and Agree with Plan of Care Patient      Patient will benefit from skilled therapeutic intervention in order to improve the following deficits and impairments:  Decreased range of motion, Decreased strength, Increased edema, Postural dysfunction, Improper body mechanics, Pain, Increased muscle spasms, Impaired UE functional use  Visit Diagnosis: Acute pain of right shoulder  Stiffness of right shoulder, not elsewhere classified  Localized swelling, mass and lump, right upper limb     Problem List Patient Active Problem List   Diagnosis Date Noted  . Shoulder arthritis 05/23/2017  . Hereditary and idiopathic peripheral neuropathy 03/24/2015  . 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 Pain of right heel 12/18/2012    Jearld LeschALBRIGHT,Levorn Oleski W., PT 06/21/2017, 10:11 AM  [facility] [address] [place], [place], [address] Phone: [phone]   Fax:  [phone]  Name: George Freeman MRN: [id number] Date of Birth: January 18, 1946

## 2017-06-23 ENCOUNTER — Encounter: Payer: Self-pay | Admitting: Physical Therapy

## 2017-06-23 ENCOUNTER — Ambulatory Visit: Payer: Medicare Other | Admitting: Physical Therapy

## 2017-06-23 DIAGNOSIS — M25511 Pain in right shoulder: Secondary | ICD-10-CM | POA: Diagnosis not present

## 2017-06-23 DIAGNOSIS — R2231 Localized swelling, mass and lump, right upper limb: Secondary | ICD-10-CM

## 2017-06-23 DIAGNOSIS — M25611 Stiffness of right shoulder, not elsewhere classified: Secondary | ICD-10-CM

## 2017-06-23 NOTE — Therapy (Signed)
[facility] [address] [place], [place], [address] Phone: [phone]   Fax:  [phone]  Physical Therapy Treatment  Patient Details  Name: George Freeman J Girardot MRN: [id number] Date of Birth: Feb 13, 1946 Referring Provider: August Saucerean  Encounter Date: 06/23/2017      PT End of Session - 06/23/17 1011    Visit Number 7   Date for PT Re-Evaluation 08/06/17   PT Start Time 0920   PT Stop Time 1021   PT Time Calculation (min) 61 min   Activity Tolerance Patient tolerated treatment well   Behavior During Therapy [facility] for tasks assessed/performed      Past Medical History:  Diagnosis Date  . Anxiety   . Arthritis   . BPV (benign positional vertigo)   . Depression   . GERD (gastroesophageal reflux disease)   . Neuromuscular disorder (HCC)   . Panic disorder   . PONV (postoperative nausea and vomiting)    chills and anxiety after surgery    Past Surgical History:  Procedure Laterality Date  . ANAL FISSURE REPAIR  1993  . APPENDECTOMY  1969  . 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 BACK SURGERY  2011   Stenosis, sciatica  . BACK SURGERY  December 2014  . CERVICAL SPINE SURGERY  Feb. 2014  . CERVICAL SPINE SURGERY  07/23/15   fusion  . COLONOSCOPY    . KNEE SURGERY     2x- cartilage  . left foot surgery  1985  . Left shoulder surgery  1990  . Right shoulder surgery  2012  . TOTAL KNEE ARTHROPLASTY  January 2011  . TOTAL KNEE ARTHROPLASTY  Feb. 2012  . TOTAL SHOULDER ARTHROPLASTY Right 05/23/2017   Procedure: RIGHT TOTAL SHOULDER ARTHROPLASTY;  Surgeon: Cammy Copaean, Scott Gregory, MD;  [facility];  Service: Orthopedics;  Laterality: Right;  Marland Kitchen. VASECTOMY      There were no vitals filed for this visit.      Subjective Assessment - 06/23/17 0919    Subjective (P)  Patient reports that he was able to play the guitar yesterday without pain, just fatigue.     Currently in Pain? (P)  Yes   Pain Score (P)  1    Pain Location (P)  Shoulder            [facility] PT Assessment - 06/23/17 0001      ROM / Strength   AROM / PROM / Strength AROM     AROM   AROM Assessment Site Shoulder   Right/Left Shoulder Right   Right Shoulder Flexion 110 Degrees  with elbow bent   Right Shoulder Internal Rotation 30 Degrees   Right Shoulder External Rotation 50 Degrees     PROM   Right Shoulder Flexion 120 Degrees   Right Shoulder ABduction 105 Degrees   Right Shoulder Internal Rotation 35 Degrees   Right Shoulder External Rotation 55 Degrees                     [facility] Adult PT Treatment/Exercise - 06/23/17 0001      Shoulder Exercises: Supine   Other Supine Exercises isometric circles with 1#     Shoulder Exercises: Standing   Extension 20 reps;Theraband   Theraband Level (Shoulder Extension) Level 2 (Red)   Row 20 reps;Theraband   Theraband Level (Shoulder Row) Level 2 (Red)   Other Standing Exercises wand exercises all motions,     Shoulder  Exercises: ROM/Strengthening   UBE (Upper Arm Bike) level 4 x 6 minutes   Wall Wash flexion, CW/CCW circles x10, ball rolling up wall,    Wall Pushups 20 reps   "W" Arms 15 reps   Other ROM/Strengthening Exercises shrugs and scapular retraction, went over HEP and had him demonstrate   Other ROM/Strengthening Exercises  triceps 20#     Shoulder Exercises: Isometric Strengthening   Extension 5X10"   ADduction 5X10"     Shoulder Exercises: Stretch   Corner Stretch 4 reps;10 seconds   Table Stretch - Flexion 10 seconds;5 reps   Table Stretch - External Rotation 10 seconds;5 reps   Star Gazer Stretch 20 seconds;4 reps     Programme researcher, broadcasting/film/video Location right shoulder   Electrical Stimulation Action IFC   Electrical Stimulation Parameters sitting   Electrical Stimulation Goals Pain     Vasopneumatic   Number Minutes Vasopneumatic  15 minutes   Vasopnuematic Location  Shoulder   Vasopneumatic Pressure Low    Vasopneumatic Temperature  40     Manual Therapy   Manual Therapy Passive ROM   Passive ROM PROM of the right shoulder all motions to pain, some pectoral stretches, and forearm stretches                  PT Short Term Goals - 06/13/17 0919      PT SHORT TERM GOAL #1   Title independent with initial HEP   Status Achieved           PT Long Term Goals - 06/23/17 1016      PT LONG TERM GOAL #1   Title understand protocol and the progression   Status Achieved     PT LONG TERM GOAL #2   Title decrease pain 50%   Status On-going     PT LONG TERM GOAL #3   Title increase AROM of right shoulder flexion to 110 degrees   Status On-going     PT LONG TERM GOAL #4   Title report no difficulty dressing or doing hair   Status On-going     PT LONG TERM GOAL #5   Title lift 3# to shoulder height shelf   Status On-going               Plan - 06/23/17 1012    Clinical Impression Statement Overall patient is doing very well, his ROM is improving, his ability to do ADL's is improving.  He is still very tight anteriorly but improving, he fatigues easily.   PT Next Visit Plan We have followed the MD order/protocol.  Please advise about subscapularis/IR active motions and resistance training.  Thank you   Consulted and Agree with Plan of Care Patient      Patient will benefit from skilled therapeutic intervention in order to improve the following deficits and impairments:  Decreased range of motion, Decreased strength, Increased edema, Postural dysfunction, Improper body mechanics, Pain, Increased muscle spasms, Impaired UE functional use  Visit Diagnosis: Acute pain of right shoulder  Stiffness of right shoulder, not elsewhere classified  Localized swelling, mass and lump, right upper limb     Problem List Patient Active Problem List   Diagnosis Date Noted  . Shoulder arthritis 05/23/2017  . Hereditary and idiopathic peripheral neuropathy 03/24/2015  . 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 Pain of right heel 12/18/2012    Jearld Lesch., PT 06/23/2017, 10:17 AM  [facility] [address] [place], [place], [address] Phone: [phone]   Fax:  [phone]  Name: DARREL GLOSS MRN: [id number] Date of Birth: 1946-03-25

## 2017-06-26 ENCOUNTER — Ambulatory Visit (INDEPENDENT_AMBULATORY_CARE_PROVIDER_SITE_OTHER): Payer: Medicare Other | Admitting: Orthopedic Surgery

## 2017-06-26 DIAGNOSIS — M19019 Primary osteoarthritis, unspecified shoulder: Secondary | ICD-10-CM

## 2017-06-26 NOTE — Progress Notes (Signed)
   Post-Op Visit Note   Patient: George Freeman           Date of Birth: 07/11/1946           MRN: [id number] Visit Date: 06/26/2017 PCP: Tracey HarriesBouska, David, MD   Assessment & Plan:  Chief Complaint:  Chief Complaint  Patient presents with  . Right Shoulder - Routine Post Op   Visit Diagnoses: No diagnosis found.  Plan: George Freeman is now 5 weeks out right shoulder replacement.  Doing well.  Therapy notes reviewed.  On exam is Improving active and passive range of motion.  Subscap is still slightly weak.  Rotator cuff strength is improving.  Plan is to continue with range of motion and strengthening exercises.  I think at this time it's okay to start doing strengthening exercises with subscap.  Follow-up with me in 6 weeks  Follow-Up Instructions: No Follow-up on file.   Orders:  No orders of the defined types were placed in this encounter.  No orders of the defined types were placed in this encounter.   Imaging: No results found.  PMFS History: Patient Active Problem List   Diagnosis Date Noted  . Shoulder arthritis 05/23/2017  . Hereditary and idiopathic peripheral neuropathy 03/24/2015  . 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 Pain of right heel 12/18/2012   Past Medical History:  Diagnosis Date  . Anxiety   . Arthritis   . BPV (benign positional vertigo)   . Depression   . GERD (gastroesophageal reflux disease)   . Neuromuscular disorder (HCC)   . Panic disorder   . PONV (postoperative nausea and vomiting)    chills and anxiety after surgery    Family History  Problem Relation Age of Onset  . Diabetes Mother   . Alzheimer's disease Sister   . Acute myelogenous leukemia Unknown   . Neuropathy Neg Hx     Past Surgical History:  Procedure Laterality Date  . ANAL FISSURE REPAIR  1993  . APPENDECTOMY  1969  . 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 BACK SURGERY  2011   Stenosis, sciatica  . BACK SURGERY  December 2014  .  CERVICAL SPINE SURGERY  Feb. 2014  . CERVICAL SPINE SURGERY  07/23/15   fusion  . COLONOSCOPY    . KNEE SURGERY     2x- cartilage  . left foot surgery  1985  . Left shoulder surgery  1990  . Right shoulder surgery  2012  . TOTAL KNEE ARTHROPLASTY  January 2011  . TOTAL KNEE ARTHROPLASTY  Feb. 2012  . TOTAL SHOULDER ARTHROPLASTY Right 05/23/2017   Procedure: RIGHT TOTAL SHOULDER ARTHROPLASTY;  Surgeon: Cammy Copaean, Scott Janila Arrazola, MD;  [facility];  Service: Orthopedics;  Laterality: Right;  George Freeman Kitchen. VASECTOMY     Social History   Occupational History  . Retired    Social History Main Topics  . Smoking status: Never Smoker  . Smokeless tobacco: Never Used  . Alcohol use No  . Drug use: No  . Sexual activity: Not on file

## 2017-06-27 ENCOUNTER — Ambulatory Visit: Payer: Medicare Other | Attending: Orthopedic Surgery | Admitting: Physical Therapy

## 2017-06-27 ENCOUNTER — Encounter: Payer: Self-pay | Admitting: Physical Therapy

## 2017-06-27 DIAGNOSIS — M25611 Stiffness of right shoulder, not elsewhere classified: Secondary | ICD-10-CM | POA: Insufficient documentation

## 2017-06-27 DIAGNOSIS — M25511 Pain in right shoulder: Secondary | ICD-10-CM | POA: Diagnosis present

## 2017-06-27 DIAGNOSIS — R2231 Localized swelling, mass and lump, right upper limb: Secondary | ICD-10-CM | POA: Insufficient documentation

## 2017-06-27 NOTE — Therapy (Signed)
[facility] [address] [place], [place], [address] Phone: [phone]   Fax:  [phone]  Physical Therapy Treatment  Patient Details  Name: George Freeman MRN: [id number] Date of Birth: 04-10-1946 Referring Provider: August Saucer  Encounter Date: 06/27/2017      PT End of Session - 06/27/17 0900    Visit Number 7   Date for PT Re-Evaluation 08/06/17   PT Start Time 0835   PT Stop Time 0945   PT Time Calculation (min) 70 min   Activity Tolerance Patient tolerated treatment well   Behavior During Therapy [facility] for tasks assessed/performed      Past Medical History:  Diagnosis Date  . Anxiety   . Arthritis   . BPV (benign positional vertigo)   . Depression   . GERD (gastroesophageal reflux disease)   . Neuromuscular disorder (HCC)   . Panic disorder   . PONV (postoperative nausea and vomiting)    chills and anxiety after surgery    Past Surgical History:  Procedure Laterality Date  . ANAL FISSURE REPAIR  1993  . APPENDECTOMY  1969  . 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 BACK SURGERY  2011   Stenosis, sciatica  . BACK SURGERY  December 2014  . CERVICAL SPINE SURGERY  Feb. 2014  . CERVICAL SPINE SURGERY  07/23/15   fusion  . COLONOSCOPY    . KNEE SURGERY     2x- cartilage  . left foot surgery  1985  . Left shoulder surgery  1990  . Right shoulder surgery  2012  . TOTAL KNEE ARTHROPLASTY  January 2011  . TOTAL KNEE ARTHROPLASTY  Feb. 2012  . TOTAL SHOULDER ARTHROPLASTY Right 05/23/2017   Procedure: RIGHT TOTAL SHOULDER ARTHROPLASTY;  Surgeon: Cammy Copa, MD;  Location: [facility] OR;  Service: Orthopedics;  Laterality: Right;  Marland Kitchen VASECTOMY      There were no vitals filed for this visit.      Subjective Assessment - 06/27/17 0839    Subjective Pt had appt with his MD yesterday and can start working on subscap. Said that his shoulder is still feeling pretty good. Played gituar more recently and  has had some soreness. Complains of positional pain with walking.    Currently in Pain? Yes   Pain Score 2                          [facility] Adult PT Treatment/Exercise - 06/27/17 0001      Shoulder Exercises: Seated   External Rotation Right;Weights   External Rotation Weight (lbs) 2#  elbow abd to 90 on exercise ball     Shoulder Exercises: Standing   Internal Rotation Right;Left;Theraband   Theraband Level (Shoulder Internal Rotation) Level 1 (Yellow)  Isometric with side step.   Other Standing Exercises wand exercises: flex, ext; cabinet reaching to top shelf, flexion and scaption  flexion against the doorway   Other Standing Exercises basketball shooting against the wall     Shoulder Exercises: ROM/Strengthening   UBE (Upper Arm Bike) level 6, 3 min forward and backward   Cybex Row Limitations 15# 2x15   Wall Wash flexion with 1#  terminated exercise due to pain   Other ROM/Strengthening Exercises Lat pull downs #15 2x15   Other ROM/Strengthening Exercises  triceps 25# 2x15; biceps 5# 1x15, 10# 1x15   did well with 10# biceps     Electrical Stimulation  Electrical Stimulation Location right shoulder   Electrical Stimulation Action IFC   Electrical Stimulation Parameters sitting   Electrical Stimulation Goals Pain     Vasopneumatic   Number Minutes Vasopneumatic  15 minutes   Vasopnuematic Location  Shoulder   Vasopneumatic Pressure Low   Vasopneumatic Temperature  40     Manual Therapy   Manual Therapy Passive ROM   Passive ROM PROM of the right shoulder all motions to pain, some pectoral stretches, and forearm stretches                PT Education - 06/27/17 0939    Education provided Yes   Education Details Two way scapular retraction and ER.    Person(s) Educated Patient   Methods Explanation;Demonstration;Handout   Comprehension Verbalized understanding;Verbal cues required          PT Short Term Goals - 06/13/17 0919      PT  SHORT TERM GOAL #1   Title independent with initial HEP   Status Achieved           PT Long Term Goals - 06/23/17 1016      PT LONG TERM GOAL #1   Title understand protocol and the progression   Status Achieved     PT LONG TERM GOAL #2   Title decrease pain 50%   Status On-going     PT LONG TERM GOAL #3   Title increase AROM of right shoulder flexion to 110 degrees   Status On-going     PT LONG TERM GOAL #4   Title report no difficulty dressing or doing hair   Status On-going     PT LONG TERM GOAL #5   Title lift 3# to shoulder height shelf   Status On-going               Plan - 06/27/17 [id number]    Clinical Impression Statement Active IR was painful at end range. Switched to isometric IR with side step. Pt reported that he played his guitar a lot over the weekend and didn't do much stretching so he is stiff today. Pt has soreness and discomfort with ER/IR, flexion, and pain coming out of ER and flexed positions.    PT Treatment/Interventions ADLs/Self Care Home Management;Cryotherapy;Electrical Stimulation;Moist Heat;Therapeutic activities;Therapeutic exercise;Neuromuscular re-education;Patient/family education;Manual techniques   PT Next Visit Plan Slowly progress with ROM and strengthening, especially IR strength which was painful with resistance.    PT Home Exercise Plan Added two-way scapular retraction with red Theraband, and ER with yellow.   Consulted and Agree with Plan of Care Patient      Patient will benefit from skilled therapeutic intervention in order to improve the following deficits and impairments:  Decreased range of motion, Decreased strength, Increased edema, Postural dysfunction, Improper body mechanics, Pain, Increased muscle spasms, Impaired UE functional use  Visit Diagnosis: Acute pain of right shoulder  Stiffness of right shoulder, not elsewhere classified  Localized swelling, mass and lump, right upper limb     Problem List Patient  Active Problem List   Diagnosis Date Noted  . Shoulder arthritis 05/23/2017  . Hereditary and idiopathic peripheral neuropathy 03/24/2015  . 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 Pain of right heel 12/18/2012    Carin PrimroseKatherine Abdulkadir Emmanuel, SPT 06/27/2017, 9:57 AM  [facility] [address] [place], [place], [address] Phone: [phone]   Fax:  [phone]  Name: Tawanna Solorvin J Daponte MRN: [id number]  Date of Birth: January 26, 1946

## 2017-06-29 ENCOUNTER — Encounter: Payer: Self-pay | Admitting: Physical Therapy

## 2017-06-29 ENCOUNTER — Ambulatory Visit: Payer: Medicare Other | Admitting: Physical Therapy

## 2017-06-29 DIAGNOSIS — M25511 Pain in right shoulder: Secondary | ICD-10-CM

## 2017-06-29 DIAGNOSIS — R2231 Localized swelling, mass and lump, right upper limb: Secondary | ICD-10-CM

## 2017-06-29 DIAGNOSIS — M25611 Stiffness of right shoulder, not elsewhere classified: Secondary | ICD-10-CM

## 2017-06-29 NOTE — Therapy (Signed)
[facility] [address] [place], [place], [address] Phone: [phone]   Fax:  [phone]  Physical Therapy Treatment  Patient Details  Name: George Freeman MRN: [id number] Date of Birth: 1946/03/10 Referring Provider: August Saucer  Encounter Date: 06/29/2017      PT End of Session - 06/29/17 1146    Visit Number 8   Date for PT Re-Evaluation 08/06/17   PT Start Time 1055   PT Stop Time 1155   PT Time Calculation (min) 60 min   Activity Tolerance Patient tolerated treatment well   Behavior During Therapy [facility] for tasks assessed/performed      Past Medical History:  Diagnosis Date  . Anxiety   . Arthritis   . BPV (benign positional vertigo)   . Depression   . GERD (gastroesophageal reflux disease)   . Neuromuscular disorder (HCC)   . Panic disorder   . PONV (postoperative nausea and vomiting)    chills and anxiety after surgery    Past Surgical History:  Procedure Laterality Date  . ANAL FISSURE REPAIR  1993  . APPENDECTOMY  1969  . 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 BACK SURGERY  2011   Stenosis, sciatica  . BACK SURGERY  December 2014  . CERVICAL SPINE SURGERY  Feb. 2014  . CERVICAL SPINE SURGERY  07/23/15   fusion  . COLONOSCOPY    . KNEE SURGERY     2x- cartilage  . left foot surgery  1985  . Left shoulder surgery  1990  . Right shoulder surgery  2012  . TOTAL KNEE ARTHROPLASTY  January 2011  . TOTAL KNEE ARTHROPLASTY  Feb. 2012  . TOTAL SHOULDER ARTHROPLASTY Right 05/23/2017   Procedure: RIGHT TOTAL SHOULDER ARTHROPLASTY;  Surgeon: Cammy Copa, MD;  Location: [facility] OR;  Service: Orthopedics;  Laterality: Right;  Marland Kitchen VASECTOMY      There were no vitals filed for this visit.      Subjective Assessment - 06/29/17 1057    Subjective Pt reports that he feels like he is making progress doing his HEP. [place] Spaniel that he is having less pain with the doorway stretch and generally feeling like  there has been improvements. He has still been playing his guitar and he is having less fatigue and is able to play longer.    Currently in Pain? No/denies   Pain Score 0-No pain                         [facility] Adult PT Treatment/Exercise - 06/29/17 0001      Shoulder Exercises: Seated   External Rotation Right;Weights   External Rotation Weight (lbs) 2#  elbow abd to 90 degrees on exercise ball   Other Seated Exercises Nustep level 5, 6 min.      Shoulder Exercises: Standing   External Rotation Both;10 reps  Very weak   Theraband Level (Shoulder External Rotation) Level 1 (Yellow)   Other Standing Exercises cabinet reaching to top shelf flexion, rhythmic stabilization (flexion) with ball against wall facing and back to wall (using both hands)     Shoulder Exercises: ROM/Strengthening   Cybex Row Limitations #20 2x15   Other ROM/Strengthening Exercises Lat pull downs 20# 2x15, chest press with serratus push 5# 1x8 terminated due to pain)    Other ROM/Strengthening Exercises  triceps 25# 1x15 #35 1x15; biceps 10# 2x15     Electrical Stimulation  Electrical Stimulation Location right shoulder   Electrical Stimulation Action IFC   Electrical Stimulation Parameters sitting   Electrical Stimulation Goals Pain     Vasopneumatic   Number Minutes Vasopneumatic  15 minutes   Vasopnuematic Location  Shoulder   Vasopneumatic Pressure Low   Vasopneumatic Temperature  40                  PT Short Term Goals - 06/13/17 0919      PT SHORT TERM GOAL #1   Title independent with initial HEP   Status Achieved           PT Long Term Goals - 06/23/17 1016      PT LONG TERM GOAL #1   Title understand protocol and the progression   Status Achieved     PT LONG TERM GOAL #2   Title decrease pain 50%   Status On-going     PT LONG TERM GOAL #3   Title increase AROM of right shoulder flexion to 110 degrees   Status On-going     PT LONG TERM GOAL #4    Title report no difficulty dressing or doing hair   Status On-going     PT LONG TERM GOAL #5   Title lift 3# to shoulder height shelf   Status On-going               Plan - 06/29/17 1147    Clinical Impression Statement Pt did very well with exercises today. His is having less pain and better ROM overall. Still very weak with IR, and has pain coming out of end ROM positions. Needs cueing for proper form with ER with Theraband.  If Pt continues to have reduced pain, may try to decrease use of modalities.    PT Treatment/Interventions ADLs/Self Care Home Management;Cryotherapy;Electrical Stimulation;Moist Heat;Therapeutic activities;Therapeutic exercise;Neuromuscular re-education;Patient/family education;Manual techniques   PT Next Visit Plan Continue to progress ROM and strength, esp. IR. Continue with rhythmic stabilization exercises.    Consulted and Agree with Plan of Care Patient      Patient will benefit from skilled therapeutic intervention in order to improve the following deficits and impairments:  Decreased range of motion, Decreased strength, Increased edema, Postural dysfunction, Improper body mechanics, Pain, Increased muscle spasms, Impaired UE functional use  Visit Diagnosis: Acute pain of right shoulder  Stiffness of right shoulder, not elsewhere classified  Localized swelling, mass and lump, right upper limb     Problem List Patient Active Problem List   Diagnosis Date Noted  . Shoulder arthritis 05/23/2017  . Hereditary and idiopathic peripheral neuropathy 03/24/2015  . 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 Pain of right heel 12/18/2012    Carin PrimroseKatherine Bradlee Freeman, SPT 06/29/2017, 11:59 AM  [facility] [address] [place], [place], [address] Phone: [phone]   Fax:  [phone]  Name: George Freeman MRN: [id number] Date of Birth: [birth year]

## 2017-06-30 ENCOUNTER — Encounter: Payer: Self-pay | Admitting: Physical Therapy

## 2017-06-30 ENCOUNTER — Ambulatory Visit: Payer: Medicare Other | Admitting: Physical Therapy

## 2017-06-30 DIAGNOSIS — M25611 Stiffness of right shoulder, not elsewhere classified: Secondary | ICD-10-CM

## 2017-06-30 DIAGNOSIS — M25511 Pain in right shoulder: Secondary | ICD-10-CM

## 2017-06-30 NOTE — Therapy (Signed)
[place], [place], [address] Phone: [phone]   Fax:  [phone]  Physical Therapy Treatment  Patient Details  Name: George Freeman MRN: [id number] Date of Birth: [birth year] Referring Provider: Marlou Sa  Encounter Date: 06/30/2017      PT End of Session - 06/30/17 1022    Visit Number 9   Date for PT Re-Evaluation 08/06/17   PT Start Time 0939   PT Stop Time 1020   PT Time Calculation (min) 41 min   Activity Tolerance Patient tolerated treatment well   Behavior During Therapy [facility] for tasks assessed/performed      Past Medical History:  Diagnosis Date  . Anxiety   . Arthritis   . BPV (benign positional vertigo)   . Depression   . GERD (gastroesophageal reflux disease)   . Neuromuscular disorder ([place])   . Panic disorder   . PONV (postoperative nausea and vomiting)    chills and anxiety after surgery    Past Surgical History:  Procedure Laterality Date  . ANAL FISSURE REPAIR  1993  . APPENDECTOMY  1969  . 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 BACK SURGERY  2011   Stenosis, sciatica  . BACK SURGERY  December 2014  . CERVICAL SPINE SURGERY  Feb. 2014  . CERVICAL SPINE SURGERY  07/23/15   fusion  . COLONOSCOPY    . KNEE SURGERY     2x- cartilage  . left foot surgery  1985  . Left shoulder surgery  1990  . Right shoulder surgery  2012  . TOTAL KNEE ARTHROPLASTY  January 2011  . TOTAL KNEE ARTHROPLASTY  Feb. 2012  . TOTAL SHOULDER ARTHROPLASTY Right 05/23/2017   Procedure: RIGHT TOTAL SHOULDER ARTHROPLASTY;  Surgeon: Meredith Pel, MD;  Location: [facility];  Service: Orthopedics;  Laterality: Right;  Marland Kitchen VASECTOMY      There were no vitals filed for this visit.      Subjective Assessment - 06/30/17 0945    Subjective Some increased soreness after PT yesterday but is doing well. Said that he feels good.    Currently in Pain? No/denies   Pain Score 0-No pain             [facility] PT Assessment - 06/30/17 0001      AROM   AROM Assessment Site Shoulder   Right/Left Shoulder Right   Right Shoulder Flexion 120 Degrees                     [facility] Adult PT Treatment/Exercise - 06/30/17 0001      Shoulder Exercises: Seated   External Rotation Right;Weights   External Rotation Weight (lbs) 2#   Other Seated Exercises --     Shoulder Exercises: Standing   External Rotation 10 reps   Theraband Level (Shoulder External Rotation) Level 1 (Yellow)  side steps   Internal Rotation 10 reps   Theraband Level (Shoulder Internal Rotation) Level 1 (Yellow)  side-steps   Other Standing Exercises cabinet reaching to top shelf (flexion and scaption)     Shoulder Exercises: ROM/Strengthening   UBE (Upper Arm Bike) level 6, 3 min forward and backward   Cybex Row Limitations #20 2x15, 25# 1x15   Other ROM/Strengthening Exercises Lat pull downs 25# 2x15   Other ROM/Strengthening Exercises  triceps #35 2x15; biceps 10# 2x15  PT Short Term Goals - 06/13/17 0919      PT SHORT TERM GOAL #1   Title independent with initial HEP   Status Achieved           PT Long Term Goals - 06/30/17 1034      PT LONG TERM GOAL #2   Title decrease pain 50%   Time 12   Period Weeks   Status Partially Met     PT LONG TERM GOAL #3   Title increase AROM of right shoulder flexion to 110 degrees   Time 12   Period Weeks   Status Achieved     PT LONG TERM GOAL #4   Title report no difficulty dressing or doing hair   Time 12   Period Weeks   Status Partially Met     PT LONG TERM GOAL #5   Title lift 3# to shoulder height shelf   Time 12   Period Weeks   Status On-going               Plan - 06/30/17 1026    Clinical Impression Statement Pt is still progressing well with treatment. He was able to do more weight with some of his exercises. His AROM is improving but is not WNL yet, and he still has pain coming out of  his end range. Has some biceps pain with isometric ER/IR. He was better able to stabilize IR than ER with side-stepping.  We did not do the modalities today because his swelling has improved.    PT Treatment/Interventions ADLs/Self Care Home Management;Cryotherapy;Electrical Stimulation;Moist Heat;Therapeutic activities;Therapeutic exercise;Neuromuscular re-education;Patient/family education;Manual techniques   PT Next Visit Plan Continue to progress ROM and strength, esp. IR. Continue with rhythmic stabilization exercises.    PT Home Exercise Plan Continue with two-way scapular retraction with red Theraband, and ER/IR side-stepping with yellow.   Consulted and Agree with Plan of Care Patient      Patient will benefit from skilled therapeutic intervention in order to improve the following deficits and impairments:  Decreased range of motion, Decreased strength, Increased edema, Postural dysfunction, Improper body mechanics, Pain, Increased muscle spasms, Impaired UE functional use  Visit Diagnosis: Acute pain of right shoulder  Stiffness of right shoulder, not elsewhere classified     Problem List Patient Active Problem List   Diagnosis Date Noted  . Shoulder arthritis 05/23/2017  . Hereditary and idiopathic peripheral neuropathy 03/24/2015  . 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 Pain of right heel 12/18/2012    [name], SPT 06/30/2017, 10:43 AM  [place], [place], [address] Phone: [phone]   Fax:  [phone]  Name: MANLEY FASON MRN: [id number] Date of Birth: 1946-03-25

## 2017-07-03 ENCOUNTER — Ambulatory Visit: Payer: Medicare Other | Admitting: Physical Therapy

## 2017-07-03 ENCOUNTER — Encounter: Payer: Self-pay | Admitting: Physical Therapy

## 2017-07-03 DIAGNOSIS — M25611 Stiffness of right shoulder, not elsewhere classified: Secondary | ICD-10-CM

## 2017-07-03 DIAGNOSIS — M25511 Pain in right shoulder: Secondary | ICD-10-CM | POA: Diagnosis not present

## 2017-07-03 NOTE — Therapy (Signed)
[place], [place], [address] Phone: [phone]   Fax:  [phone]  Physical Therapy Treatment  Patient Details  Name: George Freeman MRN: [id number] Date of Birth: 1946-03-12 Referring Provider: Marlou Sa  Encounter Date: 07/03/2017      PT End of Session - 07/03/17 1204    Visit Number 11   Date for PT Re-Evaluation 08/06/17   PT Start Time 1013   PT Stop Time 1108   PT Time Calculation (min) 55 min   Activity Tolerance Patient tolerated treatment well   Behavior During Therapy [facility] for tasks assessed/performed      Past Medical History:  Diagnosis Date  . Anxiety   . Arthritis   . BPV (benign positional vertigo)   . Depression   . GERD (gastroesophageal reflux disease)   . Neuromuscular disorder ([place])   . Panic disorder   . PONV (postoperative nausea and vomiting)    chills and anxiety after surgery    Past Surgical History:  Procedure Laterality Date  . ANAL FISSURE REPAIR  1993  . APPENDECTOMY  1969  . 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 BACK SURGERY  2011   Stenosis, sciatica  . BACK SURGERY  December 2014  . CERVICAL SPINE SURGERY  Feb. 2014  . CERVICAL SPINE SURGERY  07/23/15   fusion  . COLONOSCOPY    . KNEE SURGERY     2x- cartilage  . left foot surgery  1985  . Left shoulder surgery  1990  . Right shoulder surgery  2012  . TOTAL KNEE ARTHROPLASTY  January 2011  . TOTAL KNEE ARTHROPLASTY  Feb. 2012  . TOTAL SHOULDER ARTHROPLASTY Right 05/23/2017   Procedure: RIGHT TOTAL SHOULDER ARTHROPLASTY;  Surgeon: Meredith Pel, MD;  Location: [facility];  Service: Orthopedics;  Laterality: Right;  Marland Kitchen VASECTOMY      There were no vitals filed for this visit.      Subjective Assessment - 07/03/17 1020    Subjective Pt reports that he has some increased soreness and stiffness after last appointment. Reports that he feels like he is not making gains in ER ROM. PT  explained that his ROM is not far from normal, and that we're working on strengthening as part of his recovery.    Patient Stated Goals would like to be able to shoot a basketball, dress without difficulty   Currently in Pain? Yes   Pain Score 2    Pain Location Shoulder   Pain Orientation Right                         [facility] Adult PT Treatment/Exercise - 07/03/17 0001      Shoulder Exercises: Supine   Shoulder Flexion Weight (lbs) 3# 2x10   Other Supine Exercises Serratus punches 3# 2x10, isometric circles CW and CCW 3# 2X10     Shoulder Exercises: Standing   Other Standing Exercises stainding at the wall, shoulder flexion with rhythmic stabilization at the top  Pain with eccentric control downward.      Shoulder Exercises: ROM/Strengthening   UBE (Upper Arm Bike) level 6, 3 min forward and backward   Cybex Row Limitations 25# 2x15   Other ROM/Strengthening Exercises Lat pull downs 25# 2x15   Other ROM/Strengthening Exercises  triceps #35 2x15; biceps 10# 2x15     Modalities   Modalities Moist Heat;Cryotherapy     Moist  Heat Therapy   Number Minutes Moist Heat 15 Minutes   Moist Heat Location Cervical     Cryotherapy   Number Minutes Cryotherapy 15 Minutes   Cryotherapy Location Shoulder   Type of Cryotherapy Ice pack     Electrical Stimulation   Electrical Stimulation Location right shoulder   Electrical Stimulation Action IFC   Electrical Stimulation Parameters sitting   Electrical Stimulation Goals Pain                  PT Short Term Goals - 06/13/17 0919      PT SHORT TERM GOAL #1   Title independent with initial HEP   Status Achieved           PT Long Term Goals - 06/30/17 1034      PT LONG TERM GOAL #2   Title decrease pain 50%   Time 12   Period Weeks   Status Partially Met     PT LONG TERM GOAL #3   Title increase AROM of right shoulder flexion to 110 degrees   Time 12   Period Weeks   Status Achieved     PT LONG  TERM GOAL #4   Title report no difficulty dressing or doing hair   Time 12   Period Weeks   Status Partially Met     PT LONG TERM GOAL #5   Title lift 3# to shoulder height shelf   Time 12   Period Weeks   Status On-going               Plan - August 02, 2017 1205    Clinical Impression Statement Pt is doing well with treatment. He had some increased pain and sorness last week after we tried going without modalities. He is still having a lot of pain eccentrically controling bringing his arm out of flexion. We moved to a supine version of the exercise and had much less pain.    PT Treatment/Interventions ADLs/Self Care Home Management;Cryotherapy;Electrical Stimulation;Moist Heat;Therapeutic activities;Therapeutic exercise;Neuromuscular re-education;Patient/family education;Manual techniques   PT Next Visit Plan Continue to progress ROM and strength, esp. IR. Continue with rhythmic stabilization exercises.    PT Home Exercise Plan Continue with two-way scapular retraction with red Theraband, and ER/IR side-stepping with yellow.   Consulted and Agree with Plan of Care Patient      Patient will benefit from skilled therapeutic intervention in order to improve the following deficits and impairments:  Decreased range of motion, Decreased strength, Increased edema, Postural dysfunction, Improper body mechanics, Pain, Increased muscle spasms, Impaired UE functional use  Visit Diagnosis: Acute pain of right shoulder  Stiffness of right shoulder, not elsewhere classified       G-Codes - Aug 02, 2017 1008    Functional Assessment Tool Used (Outpatient Only) foto 48% limitation   Functional Limitation Other PT primary   Other PT Primary Current Status (T4656) At least 40 percent but less than 60 percent impaired, limited or restricted   Other PT Primary Goal Status (C1275) At least 40 percent but less than 60 percent impaired, limited or restricted      Problem List Patient Active Problem  List   Diagnosis Date Noted  . Shoulder arthritis 05/23/2017  . Hereditary and idiopathic peripheral neuropathy 03/24/2015  . 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 Pain of right heel 12/18/2012    [name], SPT 08-02-2017, 12:11 PM  [facility] [place] [phone]  [place], [place], [address] Phone: [phone]   Fax:  [phone]  Name: George Freeman MRN: [id number] Date of Birth: 01-26-1946

## 2017-07-05 ENCOUNTER — Ambulatory Visit: Payer: Medicare Other | Admitting: Physical Therapy

## 2017-07-05 ENCOUNTER — Encounter: Payer: Self-pay | Admitting: Physical Therapy

## 2017-07-05 DIAGNOSIS — M25511 Pain in right shoulder: Secondary | ICD-10-CM | POA: Diagnosis not present

## 2017-07-05 DIAGNOSIS — M25611 Stiffness of right shoulder, not elsewhere classified: Secondary | ICD-10-CM

## 2017-07-05 NOTE — Therapy (Signed)
[place], [place], [address] Phone: [phone]   Fax:  [phone]  Physical Therapy Treatment  Patient Details  Name: George Freeman MRN: [id number] Date of Birth: 1946/09/11 Referring Provider: Marlou Sa  Encounter Date: 07/05/2017      PT End of Session - 07/05/17 1015    Visit Number 12   Date for PT Re-Evaluation 08/06/17   PT Start Time 0929   PT Stop Time 1020   PT Time Calculation (min) 51 min   Activity Tolerance Patient tolerated treatment well   Behavior During Therapy [facility] for tasks assessed/performed      Past Medical History:  Diagnosis Date  . Anxiety   . Arthritis   . BPV (benign positional vertigo)   . Depression   . GERD (gastroesophageal reflux disease)   . Neuromuscular disorder ([place])   . Panic disorder   . PONV (postoperative nausea and vomiting)    chills and anxiety after surgery    Past Surgical History:  Procedure Laterality Date  . ANAL FISSURE REPAIR  1993  . APPENDECTOMY  1969  . 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 BACK SURGERY  2011   Stenosis, sciatica  . BACK SURGERY  December 2014  . CERVICAL SPINE SURGERY  Feb. 2014  . CERVICAL SPINE SURGERY  07/23/15   fusion  . COLONOSCOPY    . KNEE SURGERY     2x- cartilage  . left foot surgery  1985  . Left shoulder surgery  1990  . Right shoulder surgery  2012  . TOTAL KNEE ARTHROPLASTY  January 2011  . TOTAL KNEE ARTHROPLASTY  Feb. 2012  . TOTAL SHOULDER ARTHROPLASTY Right 05/23/2017   Procedure: RIGHT TOTAL SHOULDER ARTHROPLASTY;  Surgeon: Meredith Pel, MD;  Location: [facility];  Service: Orthopedics;  Laterality: Right;  Marland Kitchen VASECTOMY      There were no vitals filed for this visit.      Subjective Assessment - 07/05/17 0939    Subjective Pt reports that he is doing pretty good. Denies discomfort, pain, soreness, tightness. Said that adding the modalities back in last week helped.    Patient Stated Goals would like to be able to shoot a basketball, dress without difficulty   Currently in Pain? No/denies   Pain Score 0-No pain                         [facility] Adult PT Treatment/Exercise - 07/05/17 0001      Shoulder Exercises: Standing   External Rotation 10 reps   Theraband Level (Shoulder External Rotation) Level 1 (Yellow)  side-steps   Internal Rotation 10 reps   Theraband Level (Shoulder Internal Rotation) Level 1 (Yellow)  side-steps   Other Standing Exercises shooting green medicine ball like a basketball     Shoulder Exercises: ROM/Strengthening   UBE (Upper Arm Bike) level 6, 3 min forward and backward   Cybex Row Limitations 25# 2x15   Rhythmic Stabilization, Supine standing with bilat arms in flexion   Other ROM/Strengthening Exercises Lat pull downs 25# 2x15   Other ROM/Strengthening Exercises  triceps #35 2x15; biceps 10# 2x15     Cryotherapy   Number Minutes Cryotherapy 10 Minutes  Pt had to leave before end of 15 min treatment.    Cryotherapy Location Shoulder   Type of Cryotherapy Ice pack     Electrical Stimulation   Electrical Stimulation  Location right shoulder   Electrical Stimulation Action IFC   Electrical Stimulation Parameters sitting   Electrical Stimulation Goals Pain                  PT Short Term Goals - 06/13/17 0919      PT SHORT TERM GOAL #1   Title independent with initial HEP   Status Achieved           PT Long Term Goals - 06/30/17 1034      PT LONG TERM GOAL #2   Title decrease pain 50%   Time 12   Period Weeks   Status Partially Met     PT LONG TERM GOAL #3   Title increase AROM of right shoulder flexion to 110 degrees   Time 12   Period Weeks   Status Achieved     PT LONG TERM GOAL #4   Title report no difficulty dressing or doing hair   Time 12   Period Weeks   Status Partially Met     PT LONG TERM GOAL #5   Title lift 3# to shoulder height shelf   Time 12   Period  Weeks   Status On-going               Plan - 07/05/17 1015    Clinical Impression Statement Pt did really well with treatment today. He is having less pain eccentrically contolling bringing his arm out of flexion. Still has weakness with isometric ER exercises.    PT Treatment/Interventions ADLs/Self Care Home Management;Cryotherapy;Electrical Stimulation;Moist Heat;Therapeutic activities;Therapeutic exercise;Neuromuscular re-education;Patient/family education;Manual techniques   PT Next Visit Plan Continue to progress ROM and strength, esp. ER. Continue with rhythmic stabilization exercises and shooting basketball.    PT Home Exercise Plan Continue with two-way scapular retraction with red Theraband, and ER/IR side-stepping with yellow.   Consulted and Agree with Plan of Care Patient      Patient will benefit from skilled therapeutic intervention in order to improve the following deficits and impairments:  Decreased range of motion, Decreased strength, Increased edema, Postural dysfunction, Improper body mechanics, Pain, Increased muscle spasms, Impaired UE functional use  Visit Diagnosis: Acute pain of right shoulder  Stiffness of right shoulder, not elsewhere classified     Problem List Patient Active Problem List   Diagnosis Date Noted  . Shoulder arthritis 05/23/2017  . Hereditary and idiopathic peripheral neuropathy 03/24/2015  . 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 Pain of right heel 12/18/2012    [name], SPT 07/05/2017, 10:19 AM  [place], [place], [address] Phone: [phone]   Fax:  [phone]  Name: George Freeman MRN: [id number] Date of Birth: Aug 15, 1946

## 2017-07-07 ENCOUNTER — Ambulatory Visit: Payer: Medicare Other | Admitting: Physical Therapy

## 2017-07-07 ENCOUNTER — Encounter: Payer: Self-pay | Admitting: Physical Therapy

## 2017-07-07 DIAGNOSIS — M25511 Pain in right shoulder: Secondary | ICD-10-CM

## 2017-07-07 DIAGNOSIS — M25611 Stiffness of right shoulder, not elsewhere classified: Secondary | ICD-10-CM

## 2017-07-07 NOTE — Therapy (Signed)
[place], [place], [address] Phone: [phone]   Fax:  [phone]  Physical Therapy Treatment  Patient Details  Name: George Freeman MRN: [id number] Date of Birth: 1946/12/09 Referring Provider: Marlou Sa  Encounter Date: 07/07/2017      PT End of Session - 07/07/17 0922    Visit Number 13   Date for PT Re-Evaluation 08/06/17   PT Start Time 0835   PT Stop Time 0925   PT Time Calculation (min) 50 min   Activity Tolerance Patient tolerated treatment well   Behavior During Therapy [facility] for tasks assessed/performed      Past Medical History:  Diagnosis Date  . Anxiety   . Arthritis   . BPV (benign positional vertigo)   . Depression   . GERD (gastroesophageal reflux disease)   . Neuromuscular disorder ([place])   . Panic disorder   . PONV (postoperative nausea and vomiting)    chills and anxiety after surgery    Past Surgical History:  Procedure Laterality Date  . ANAL FISSURE REPAIR  1993  . APPENDECTOMY  1969  . 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 BACK SURGERY  2011   Stenosis, sciatica  . BACK SURGERY  December 2014  . CERVICAL SPINE SURGERY  Feb. 2014  . CERVICAL SPINE SURGERY  07/23/15   fusion  . COLONOSCOPY    . KNEE SURGERY     2x- cartilage  . left foot surgery  1985  . Left shoulder surgery  1990  . Right shoulder surgery  2012  . TOTAL KNEE ARTHROPLASTY  January 2011  . TOTAL KNEE ARTHROPLASTY  Feb. 2012  . TOTAL SHOULDER ARTHROPLASTY Right 05/23/2017   Procedure: RIGHT TOTAL SHOULDER ARTHROPLASTY;  Surgeon: Meredith Pel, MD;  Location: [facility];  Service: Orthopedics;  Laterality: Right;  Marland Kitchen VASECTOMY      There were no vitals filed for this visit.      Subjective Assessment - 07/07/17 0839    Subjective Pt reports that he is doing fine. Said that his shoulder feels good. Said that he still feels like he is doing better and improving.    Patient Stated  Goals would like to be able to shoot a basketball, dress without difficulty   Currently in Pain? No/denies   Pain Score 0-No pain                         [facility] Adult PT Treatment/Exercise - 07/07/17 0001      Shoulder Exercises: Seated   External Rotation Left;15 reps;Weights  2 sets, shoulder at 90 abd on exercise ball   External Rotation Weight (lbs) 2#     Shoulder Exercises: Standing   External Rotation 10 reps  side-stepping   Theraband Level (Shoulder External Rotation) Level 1 (Yellow)   Internal Rotation 10 reps  side-stepping   Theraband Level (Shoulder Internal Rotation) Level 1 (Yellow)   Other Standing Exercises shooting green medicine ball like a basketball     Shoulder Exercises: ROM/Strengthening   UBE (Upper Arm Bike) level 6, 3 min forward and backward   Cybex Row Limitations 25# 2x15   Rhythmic Stabilization, Supine standing with bilat arms in flexion   Other ROM/Strengthening Exercises Lat pull downs 25# 2x15   Other ROM/Strengthening Exercises  triceps #35 2x15; biceps 10# 2x15     Cryotherapy   Number Minutes Cryotherapy 15 Minutes  Cryotherapy Location Shoulder   Type of Cryotherapy Ice pack     Electrical Stimulation   Electrical Stimulation Location right shoulder   Electrical Stimulation Action IFC   Electrical Stimulation Parameters sitting   Electrical Stimulation Goals Pain                  PT Short Term Goals - 06/13/17 0919      PT SHORT TERM GOAL #1   Title independent with initial HEP   Status Achieved           PT Long Term Goals - 07/07/17 0927      PT LONG TERM GOAL #2   Title decrease pain 50%   Time 12   Period Weeks   Status Achieved     PT LONG TERM GOAL #4   Title report no difficulty dressing or doing hair   Time 12   Period Weeks   Status Achieved     PT LONG TERM GOAL #5   Title lift 3# to shoulder height shelf   Time 12   Period Weeks   Status Partially Met                Plan - 07/07/17 0923    Clinical Impression Statement Pt is doing well and still improving. Still has some pain with end range active ER. He is going on vacation next week so he is going to continue with stretches to keep his ROM gains and we will continue to work with him when he comes back.    PT Treatment/Interventions ADLs/Self Care Home Management;Cryotherapy;Electrical Stimulation;Moist Heat;Therapeutic activities;Therapeutic exercise;Neuromuscular re-education;Patient/family education;Manual techniques   PT Next Visit Plan Pt will be coming back from vacation. Need to assess how he is doing with the break from PT. Continue to progress ROM and strength, esp. ER/IR. Continue with rhythmic stabilization exercises and shooting basketball.    PT Home Exercise Plan Continue with two-way scapular retraction with red Theraband, and ER/IR side-stepping with yellow. Gave HEP for vacation including rhythmic stabilization, shooting basketball, active flexion, circles, wand exercises, and how to modify if he has pain.    Consulted and Agree with Plan of Care Patient      Patient will benefit from skilled therapeutic intervention in order to improve the following deficits and impairments:  Decreased range of motion, Decreased strength, Increased edema, Postural dysfunction, Improper body mechanics, Pain, Increased muscle spasms, Impaired UE functional use  Visit Diagnosis: Acute pain of right shoulder  Stiffness of right shoulder, not elsewhere classified     Problem List Patient Active Problem List   Diagnosis Date Noted  . Shoulder arthritis 05/23/2017  . Hereditary and idiopathic peripheral neuropathy 03/24/2015  . 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 Pain of right heel 12/18/2012    [name], SPT 07/07/2017, 9:44 AM  [place], [place], [address] Phone:  [phone]   Fax:  [phone]  Name: George Freeman MRN: [id number] Date of Birth: 08/29/1946

## 2017-07-24 ENCOUNTER — Encounter: Payer: Self-pay | Admitting: Physical Therapy

## 2017-07-24 ENCOUNTER — Ambulatory Visit: Payer: Medicare Other | Admitting: Physical Therapy

## 2017-07-24 DIAGNOSIS — M25611 Stiffness of right shoulder, not elsewhere classified: Secondary | ICD-10-CM

## 2017-07-24 DIAGNOSIS — M25511 Pain in right shoulder: Secondary | ICD-10-CM | POA: Diagnosis not present

## 2017-07-24 NOTE — Therapy (Signed)
[place], [place], [address] Phone: [phone]   Fax:  [phone]  Physical Therapy Treatment  Patient Details  Name: George Freeman MRN: [id number] Date of Birth: July 27, 1946 Referring Provider: Marlou Sa  Encounter Date: 07/24/2017      PT End of Session - 07/24/17 0928    Visit Number 14   Date for PT Re-Evaluation 08/06/17   PT Start Time 0835   PT Stop Time 0940   PT Time Calculation (min) 65 min   Activity Tolerance Patient tolerated treatment well   Behavior During Therapy [facility] for tasks assessed/performed      Past Medical History:  Diagnosis Date  . Anxiety   . Arthritis   . BPV (benign positional vertigo)   . Depression   . GERD (gastroesophageal reflux disease)   . Neuromuscular disorder ([place])   . Panic disorder   . PONV (postoperative nausea and vomiting)    chills and anxiety after surgery    Past Surgical History:  Procedure Laterality Date  . ANAL FISSURE REPAIR  1993  . APPENDECTOMY  1969  . 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 BACK SURGERY  2011   Stenosis, sciatica  . BACK SURGERY  December 2014  . CERVICAL SPINE SURGERY  Feb. 2014  . CERVICAL SPINE SURGERY  07/23/15   fusion  . COLONOSCOPY    . KNEE SURGERY     2x- cartilage  . left foot surgery  1985  . Left shoulder surgery  1990  . Right shoulder surgery  2012  . TOTAL KNEE ARTHROPLASTY  January 2011  . TOTAL KNEE ARTHROPLASTY  Feb. 2012  . TOTAL SHOULDER ARTHROPLASTY Right 05/23/2017   Procedure: RIGHT TOTAL SHOULDER ARTHROPLASTY;  Surgeon: Meredith Pel, MD;  Location: [place];  Service: Orthopedics;  Laterality: Right;  Marland Kitchen VASECTOMY      There were no vitals filed for this visit.      Subjective Assessment - 07/24/17 0841    Subjective Pt is back from two weeks of vacation. He said that he doesn't feel like he is moving as well as he should. Reports that he tried to used both hands to  reach down and lift a chair from the sides and he had a lot of pain in his R arm with the IR from squeezing it. Said he isn't having any pain with playing guitar.    Patient Stated Goals would like to be able to shoot a basketball, dress without difficulty   Currently in Pain? No/denies   Pain Score 0-No pain            [facility] PT Assessment - 07/24/17 0001      AROM   Right Shoulder Flexion 120 Degrees   Right Shoulder Internal Rotation 35 Degrees   Right Shoulder External Rotation 85 Degrees     PROM   Right Shoulder Flexion 150 Degrees                     [facility] Adult PT Treatment/Exercise - 07/24/17 0001      Shoulder Exercises: Standing   External Rotation 15 reps;Theraband   Theraband Level (Shoulder External Rotation) Level 2 (Red)  active   Internal Rotation 15 reps;Theraband  active - Tried ball squeeze, with extended elbow was painful   Theraband Level (Shoulder Internal Rotation) Level 2 (Red)     Shoulder Exercises: ROM/Strengthening   UBE (  Upper Arm Bike) level 6, 3 min forward and backward   Cybex Row Limitations 25# 2x15   Rhythmic Stabilization, Supine standing with bilat arms in flexion  yellow ball   Other ROM/Strengthening Exercises Lat pull downs 25# 2x15   Other ROM/Strengthening Exercises  triceps #35 2x15; biceps 25# 2x15  Pt had spasms in R UE with biceps curls     Modalities   Modalities Iontophoresis     Cryotherapy   Number Minutes Cryotherapy 15 Minutes   Cryotherapy Location Shoulder   Type of Cryotherapy Ice pack     Electrical Stimulation   Electrical Stimulation Location right shoulder   Electrical Stimulation Action IFC   Electrical Stimulation Parameters sitting   Electrical Stimulation Goals Pain     Iontophoresis   Type of Iontophoresis Dexamethasone   Location R arm medial to deltoid insertion   Dose 80 mA   Time 4 hours     Manual Therapy   Manual Therapy Passive ROM   Passive ROM PROM of the right shoulder  ER to pain, and IR for good stretch, no pain                  PT Short Term Goals - 06/13/17 0919      PT SHORT TERM GOAL #1   Title independent with initial HEP   Status Achieved           PT Long Term Goals - 07/24/17 0945      PT LONG TERM GOAL #5   Title lift 3# to shoulder height shelf   Time 12   Period Weeks   Status Partially Met     Additional Long Term Goals   Additional Long Term Goals Yes     PT LONG TERM GOAL #6   Title Increase AROM in right shoulder to 135 degrees.    Time 5   Period Weeks   Status New     PT LONG TERM GOAL #7   Title Decrease pain in right shoulder with reaching overhead or reaching out front with extended elbow by 50%.    Time 5   Period Weeks   Status New               Plan - 07/24/17 7510    Clinical Impression Statement Pt did well with exercises today. He had gains in PROM but his AROM is the same. He did some stretching and exercises while on vacation and doesn't appear to have had any losses. Tried higher weight with biceps curls but Pt quickly developed R biceps muscle spasms and tenderness. Trying the iontophoresis over tender spot to see if it will help with discomfort.    PT Treatment/Interventions ADLs/Self Care Home Management;Cryotherapy;Electrical Stimulation;Moist Heat;Therapeutic activities;Therapeutic exercise;Neuromuscular re-education;Patient/family education;Manual techniques   PT Next Visit Plan Continue to progress ROM and strength, esp. ER/IR. Continue with rhythmic stabilization exercises and shooting basketball.    PT Home Exercise Plan Continue with two-way scapular retraction with red Theraband, and ER/IR side-stepping with yellow. Continue HEP given for vacation including rhythmic stabilization, shooting basketball, active flexion, circles, wand exercises, and how to modify if he has pain.       Patient will benefit from skilled therapeutic intervention in order to improve the following  deficits and impairments:  Decreased range of motion, Decreased strength, Increased edema, Postural dysfunction, Improper body mechanics, Pain, Increased muscle spasms, Impaired UE functional use  Visit Diagnosis: Acute pain of right shoulder  Stiffness of right shoulder,  not elsewhere classified     Problem List Patient Active Problem List   Diagnosis Date Noted  . Shoulder arthritis 05/23/2017  . Hereditary and idiopathic peripheral neuropathy 03/24/2015  . 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 Pain of right heel 12/18/2012    [name], SPT 07/24/2017, 10:21 AM  [facility] [place], [place], [address] Phone: [phone]   Fax:  [phone]  Name: George Freeman MRN: [id number] Date of Birth: 04/09/1946

## 2017-07-26 ENCOUNTER — Encounter: Payer: Self-pay | Admitting: Physical Therapy

## 2017-07-26 ENCOUNTER — Ambulatory Visit: Payer: Medicare Other | Attending: Orthopedic Surgery | Admitting: Physical Therapy

## 2017-07-26 DIAGNOSIS — M25511 Pain in right shoulder: Secondary | ICD-10-CM | POA: Diagnosis present

## 2017-07-26 DIAGNOSIS — M25611 Stiffness of right shoulder, not elsewhere classified: Secondary | ICD-10-CM | POA: Insufficient documentation

## 2017-07-26 DIAGNOSIS — R2231 Localized swelling, mass and lump, right upper limb: Secondary | ICD-10-CM | POA: Diagnosis present

## 2017-07-26 DIAGNOSIS — R42 Dizziness and giddiness: Secondary | ICD-10-CM | POA: Diagnosis present

## 2017-07-26 NOTE — Therapy (Signed)
[place], [place], [address] Phone: [phone]   Fax:  [phone]  Physical Therapy Treatment  Patient Details  Name: George Freeman MRN: [id number] Date of Birth: 17-Jun-1946 Referring Provider: Marlou Sa  Encounter Date: 07/26/2017      PT End of Session - 07/26/17 1038    Visit Number 15   Date for PT Re-Evaluation 08/06/17   PT Start Time 0845   PT Stop Time 0940   PT Time Calculation (min) 55 min   Activity Tolerance Patient tolerated treatment well   Behavior During Therapy [facility] for tasks assessed/performed      Past Medical History:  Diagnosis Date  . Anxiety   . Arthritis   . BPV (benign positional vertigo)   . Depression   . GERD (gastroesophageal reflux disease)   . Neuromuscular disorder ([place])   . Panic disorder   . PONV (postoperative nausea and vomiting)    chills and anxiety after surgery    Past Surgical History:  Procedure Laterality Date  . ANAL FISSURE REPAIR  1993  . APPENDECTOMY  1969  . 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 BACK SURGERY  2011   Stenosis, sciatica  . BACK SURGERY  December 2014  . CERVICAL SPINE SURGERY  Feb. 2014  . CERVICAL SPINE SURGERY  07/23/15   fusion  . COLONOSCOPY    . KNEE SURGERY     2x- cartilage  . left foot surgery  1985  . Left shoulder surgery  1990  . Right shoulder surgery  2012  . TOTAL KNEE ARTHROPLASTY  January 2011  . TOTAL KNEE ARTHROPLASTY  Feb. 2012  . TOTAL SHOULDER ARTHROPLASTY Right 05/23/2017   Procedure: RIGHT TOTAL SHOULDER ARTHROPLASTY;  Surgeon: Meredith Pel, MD;  Location: [facility];  Service: Orthopedics;  Laterality: Right;  Marland Kitchen VASECTOMY      There were no vitals filed for this visit.      Subjective Assessment - 07/26/17 0846    Subjective Pt reports that he has been doing well. Said that he did not have any increased pain after his last appointment. He has been doing his stretches and  throwing a lightweight ball, one time he reached out to the right to catch the ball and this caused pain.    Patient Stated Goals would like to be able to shoot a basketball, dress without difficulty   Currently in Pain? No/denies   Pain Score 0-No pain                         [facility] Adult PT Treatment/Exercise - 07/26/17 0001      Shoulder Exercises: Seated   External Rotation Left;Weights;20 reps   External Rotation Weight (lbs) 2#     Shoulder Exercises: Standing   Other Standing Exercises shooting green medicine ball like a basketball   Other Standing Exercises circles in 90 degress flexion and about 70 degress abd. 2x8 1# free weight  Pain above 90 and 70 degrees     Shoulder Exercises: ROM/Strengthening   UBE (Upper Arm Bike) level 6, 3 min forward and backward   Cybex Row Limitations 25# 2x15   Rhythmic Stabilization, Supine standing with bilat arms in flexion 2x10  yellow ball   Other ROM/Strengthening Exercises Lat pull downs 25# 2x15   Other ROM/Strengthening Exercises  triceps #35 2x15; biceps 10# 2x15     Cryotherapy  Number Minutes Cryotherapy 15 Minutes   Cryotherapy Location Shoulder   Type of Cryotherapy Ice pack     Electrical Stimulation   Electrical Stimulation Location right shoulder   Electrical Stimulation Action IFC   Electrical Stimulation Parameters sitting   Electrical Stimulation Goals Pain                  PT Short Term Goals - 06/13/17 0919      PT SHORT TERM GOAL #1   Title independent with initial HEP   Status Achieved           PT Long Term Goals - 07/24/17 0945      PT LONG TERM GOAL #5   Title lift 3# to shoulder height shelf   Time 12   Period Weeks   Status Partially Met     Additional Long Term Goals   Additional Long Term Goals Yes     PT LONG TERM GOAL #6   Title Increase AROM in right shoulder to 135 degrees.    Time 5   Period Weeks   Status New     PT LONG TERM GOAL #7   Title  Decrease pain in right shoulder with reaching overhead or reaching out front with extended elbow by 50%.    Time 5   Period Weeks   Status New               Plan - 07/26/17 1039    Clinical Impression Statement Pt continues to report reduced pain with activity and better ROM. He does have pain with shoulder IR with his elbow extended and with sudden movements reaching out to the sides.    PT Treatment/Interventions ADLs/Self Care Home Management;Cryotherapy;Electrical Stimulation;Moist Heat;Therapeutic activities;Therapeutic exercise;Neuromuscular re-education;Patient/family education;Manual techniques   PT Next Visit Plan Continue to progress ROM and strength, esp. ER/IR. Continue with rhythmic stabilization exercises and shooting basketball.    PT Home Exercise Plan Continue with two-way scapular retraction with red Theraband, and ER/IR side-stepping with yellow. Continue HEP given for vacation including rhythmic stabilization, shooting basketball, active flexion, circles, wand exercises, and how to modify if he has pain.    Consulted and Agree with Plan of Care Patient      Patient will benefit from skilled therapeutic intervention in order to improve the following deficits and impairments:  Decreased range of motion, Decreased strength, Increased edema, Postural dysfunction, Improper body mechanics, Pain, Increased muscle spasms, Impaired UE functional use  Visit Diagnosis: Acute pain of right shoulder  Stiffness of right shoulder, not elsewhere classified     Problem List Patient Active Problem List   Diagnosis Date Noted  . Shoulder arthritis 05/23/2017  . Hereditary and idiopathic peripheral neuropathy 03/24/2015  . 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 Pain of right heel 12/18/2012    [name], SPT  07/26/2017, 10:43 AM  [place], [place],  [address] Phone: [phone]   Fax:  [phone]  Name: George Freeman MRN: [id number] Date of Birth: [birth year]

## 2017-07-28 ENCOUNTER — Encounter: Payer: Self-pay | Admitting: Physical Therapy

## 2017-07-28 ENCOUNTER — Ambulatory Visit: Payer: Medicare Other | Admitting: Physical Therapy

## 2017-07-28 DIAGNOSIS — M25511 Pain in right shoulder: Secondary | ICD-10-CM

## 2017-07-28 DIAGNOSIS — M25611 Stiffness of right shoulder, not elsewhere classified: Secondary | ICD-10-CM

## 2017-07-28 NOTE — Therapy (Signed)
[place], [place], [address] Phone: [phone]   Fax:  [phone]  Physical Therapy Treatment  Patient Details  Name: George Freeman MRN: [id number] Date of Birth: 1946/06/14 Referring Provider: Marlou Sa  Encounter Date: 07/28/2017      PT End of Session - 07/28/17 0948    Visit Number 16   Date for PT Re-Evaluation 08/06/17   PT Start Time 0840   PT Stop Time 0940   PT Time Calculation (min) 60 min   Activity Tolerance Patient tolerated treatment well   Behavior During Therapy [facility] for tasks assessed/performed      Past Medical History:  Diagnosis Date  . Anxiety   . Arthritis   . BPV (benign positional vertigo)   . Depression   . GERD (gastroesophageal reflux disease)   . Neuromuscular disorder ([place])   . Panic disorder   . PONV (postoperative nausea and vomiting)    chills and anxiety after surgery    Past Surgical History:  Procedure Laterality Date  . ANAL FISSURE REPAIR  1993  . APPENDECTOMY  1969  . 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 BACK SURGERY  2011   Stenosis, sciatica  . BACK SURGERY  December 2014  . CERVICAL SPINE SURGERY  Feb. 2014  . CERVICAL SPINE SURGERY  07/23/15   fusion  . COLONOSCOPY    . KNEE SURGERY     2x- cartilage  . left foot surgery  1985  . Left shoulder surgery  1990  . Right shoulder surgery  2012  . TOTAL KNEE ARTHROPLASTY  January 2011  . TOTAL KNEE ARTHROPLASTY  Feb. 2012  . TOTAL SHOULDER ARTHROPLASTY Right 05/23/2017   Procedure: RIGHT TOTAL SHOULDER ARTHROPLASTY;  Surgeon: Meredith Pel, MD;  Location: [facility];  Service: Orthopedics;  Laterality: Right;  Marland Kitchen VASECTOMY      There were no vitals filed for this visit.      Subjective Assessment - 07/28/17 0844    Subjective Pt reports that he is doing well. No increased pain. Feels like his shoulder is moving well.    Patient Stated Goals would like to be able to shoot a  basketball, dress without difficulty   Currently in Pain? No/denies   Pain Score 0-No pain                         [facility] Adult PT Treatment/Exercise - 07/28/17 0001      Shoulder Exercises: Seated   External Rotation Left;Weights;20 reps   External Rotation Weight (lbs) 2#     Shoulder Exercises: Standing   External Rotation 15 reps;Theraband   Theraband Level (Shoulder External Rotation) Level 2 (Red)  active   Internal Rotation 15 reps;Theraband   Theraband Level (Shoulder Internal Rotation) Level 2 (Red)  active   Other Standing Exercises shooting green medicine ball like a basketball   Other Standing Exercises circles in 90 degress flexion and about 70 degress abd. 2x10 1# free weight     Shoulder Exercises: ROM/Strengthening   UBE (Upper Arm Bike) level 6, 3 min forward and backward   Cybex Row Limitations 25# 2x15   Rhythmic Stabilization, Supine standing with bilat arms in flexion 2x10  Added to stabilization to the right and left too.    Other ROM/Strengthening Exercises Lat pull downs 25# 2x15   Other ROM/Strengthening Exercises  triceps #35 2x15; biceps 10# 2x15  Cryotherapy   Number Minutes Cryotherapy 15 Minutes   Cryotherapy Location Shoulder   Type of Cryotherapy Ice pack     Electrical Stimulation   Electrical Stimulation Location right shoulder   Electrical Stimulation Action IFC   Electrical Stimulation Parameters sitting    Electrical Stimulation Goals Pain     Manual Therapy   Manual Therapy Passive ROM   Passive ROM PROM of the right shoulder ER to pain, and flexion, abduction, and IR for good stretch, no pain                  PT Short Term Goals - 06/13/17 0919      PT SHORT TERM GOAL #1   Title independent with initial HEP   Status Achieved           PT Long Term Goals - 07/24/17 0945      PT LONG TERM GOAL #5   Title lift 3# to shoulder height shelf   Time 12   Period Weeks   Status Partially Met      Additional Long Term Goals   Additional Long Term Goals Yes     PT LONG TERM GOAL #6   Title Increase AROM in right shoulder to 135 degrees.    Time 5   Period Weeks   Status New     PT LONG TERM GOAL #7   Title Decrease pain in right shoulder with reaching overhead or reaching out front with extended elbow by 50%.    Time 5   Period Weeks   Status New               Plan - 07/28/17 0950    Clinical Impression Statement Pt continues to improve his ROM and has better movement without pain. He has some difficulty with stabilizing weight held in abduction and flexion with his elbow straight, but this seems to be improving. He also had reduced pain with ER PROM today.    PT Treatment/Interventions ADLs/Self Care Home Management;Cryotherapy;Electrical Stimulation;Moist Heat;Therapeutic activities;Therapeutic exercise;Neuromuscular re-education;Patient/family education;Manual techniques   PT Next Visit Plan Continue to progress ROM and strength, esp. ER/IR. Continue with rhythmic stabilization exercises and shooting basketball.    PT Home Exercise Plan Continue with two-way scapular retraction with red Theraband, and ER/IR side-stepping with yellow. Continue HEP given for vacation including rhythmic stabilization, shooting basketball, active flexion, circles, wand exercises, and how to modify if he has pain.    Consulted and Agree with Plan of Care Patient      Patient will benefit from skilled therapeutic intervention in order to improve the following deficits and impairments:     Visit Diagnosis: Acute pain of right shoulder  Stiffness of right shoulder, not elsewhere classified     Problem List Patient Active Problem List   Diagnosis Date Noted  . Shoulder arthritis 05/23/2017  . Hereditary and idiopathic peripheral neuropathy 03/24/2015  . 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 Pain of right heel 12/18/2012    George Freeman,  SPT 07/28/2017, 10:11 AM  [place], [place], [address] Phone: [phone]   Fax:  [phone]  Name: George Freeman MRN: [id number] Date of Birth: 09/06/1946

## 2017-07-31 ENCOUNTER — Ambulatory Visit: Payer: Medicare Other | Admitting: Physical Therapy

## 2017-07-31 ENCOUNTER — Ambulatory Visit (INDEPENDENT_AMBULATORY_CARE_PROVIDER_SITE_OTHER): Payer: Medicare Other | Admitting: Orthopedic Surgery

## 2017-07-31 ENCOUNTER — Encounter (INDEPENDENT_AMBULATORY_CARE_PROVIDER_SITE_OTHER): Payer: Self-pay | Admitting: Orthopedic Surgery

## 2017-07-31 DIAGNOSIS — M19019 Primary osteoarthritis, unspecified shoulder: Secondary | ICD-10-CM

## 2017-07-31 NOTE — Progress Notes (Signed)
   Post-Op Visit Note   Patient: George Freeman           Date of Birth: 05/11/1946           MRN: [id number] Visit Date: 07/31/2017 PCP: Tracey HarriesBouska, David, MD   Assessment & Plan:  Chief Complaint:  Chief Complaint  Patient presents with  . Right Shoulder - Routine Post Op   Visit Diagnoses: No diagnosis found.  Plan: Elroy Channelrvin is a [age] year old patient with right shoulder replacement.  Doing well but reports some biceps pain on exam is Excellent passive range of motion and improving rotator cuff strength.  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  In general he's doing well I think he is okay at this point to ramp up his strengthening.  I'll see him back in 8 weeks for clinical recheck.  Follow-Up Instructions: Return in about 8 weeks (around 09/25/2017).   Orders:  No orders of the defined types were placed in this encounter.  No orders of the defined types were placed in this encounter.   Imaging: No results found.  PMFS History: Patient Active Problem List   Diagnosis Date Noted  . Shoulder arthritis 05/23/2017  . Hereditary and idiopathic peripheral neuropathy 03/24/2015  . 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 Pain of right heel 12/18/2012   Past Medical History:  Diagnosis Date  . Anxiety   . Arthritis   . BPV (benign positional vertigo)   . Depression   . GERD (gastroesophageal reflux disease)   . Neuromuscular disorder ([facility])   . Panic disorder   . PONV (postoperative nausea and vomiting)    chills and anxiety after surgery    Family History  Problem Relation Age of Onset  . Diabetes Mother   . Alzheimer's disease Sister   . Acute myelogenous leukemia Unknown   . Neuropathy Neg Hx     Past Surgical History:  Procedure Laterality Date  . ANAL FISSURE REPAIR  1993  . APPENDECTOMY  1969  . 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 BACK SURGERY  2011   Stenosis, sciatica  . BACK SURGERY  December 2014  .  CERVICAL SPINE SURGERY  Feb. 2014  . CERVICAL SPINE SURGERY  07/23/15   fusion  . COLONOSCOPY    . KNEE SURGERY     2x- cartilage  . left foot surgery  1985  . Left shoulder surgery  1990  . Right shoulder surgery  2012  . TOTAL KNEE ARTHROPLASTY  January 2011  . TOTAL KNEE ARTHROPLASTY  Feb. 2012  . TOTAL SHOULDER ARTHROPLASTY Right 05/23/2017   Procedure: RIGHT TOTAL SHOULDER ARTHROPLASTY;  Surgeon: Cammy Copaean, Scott Tamalyn Wadsworth, MD;  [facility];  Service: Orthopedics;  Laterality: Right;  Marland Kitchen. VASECTOMY     Social History   Occupational History  . Retired    Social History Main Topics  . Smoking status: Never Smoker  . Smokeless tobacco: Never Used  . Alcohol use No  . Drug use: No  . Sexual activity: Not on file

## 2017-08-02 ENCOUNTER — Encounter: Payer: Self-pay | Admitting: Physical Therapy

## 2017-08-02 ENCOUNTER — Ambulatory Visit: Payer: Medicare Other | Admitting: Physical Therapy

## 2017-08-02 DIAGNOSIS — M25511 Pain in right shoulder: Secondary | ICD-10-CM

## 2017-08-02 DIAGNOSIS — M25611 Stiffness of right shoulder, not elsewhere classified: Secondary | ICD-10-CM

## 2017-08-02 DIAGNOSIS — R42 Dizziness and giddiness: Secondary | ICD-10-CM

## 2017-08-02 NOTE — Therapy (Signed)
[place], [place], [address] Phone: [phone]   Fax:  [phone]  Physical Therapy Treatment  Patient Details  Name: George Freeman MRN: [id number] Date of Birth: 1946-12-20 Referring Provider: Marlou Sa  Encounter Date: 08/02/2017      PT End of Session - 08/02/17 0912    Visit Number 17   Date for PT Re-Evaluation 08/06/17   PT Start Time 0845   PT Stop Time 0939   PT Time Calculation (min) 54 min   Activity Tolerance Patient tolerated treatment well   Behavior During Therapy [facility] for tasks assessed/performed      Past Medical History:  Diagnosis Date  . Anxiety   . Arthritis   . BPV (benign positional vertigo)   . Depression   . GERD (gastroesophageal reflux disease)   . Neuromuscular disorder ([place])   . Panic disorder   . PONV (postoperative nausea and vomiting)    chills and anxiety after surgery    Past Surgical History:  Procedure Laterality Date  . ANAL FISSURE REPAIR  1993  . APPENDECTOMY  1969  . 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 BACK SURGERY  2011   Stenosis, sciatica  . BACK SURGERY  December 2014  . CERVICAL SPINE SURGERY  Feb. 2014  . CERVICAL SPINE SURGERY  07/23/15   fusion  . COLONOSCOPY    . KNEE SURGERY     2x- cartilage  . left foot surgery  1985  . Left shoulder surgery  1990  . Right shoulder surgery  2012  . TOTAL KNEE ARTHROPLASTY  January 2011  . TOTAL KNEE ARTHROPLASTY  Feb. 2012  . TOTAL SHOULDER ARTHROPLASTY Right 05/23/2017   Procedure: RIGHT TOTAL SHOULDER ARTHROPLASTY;  Surgeon: Meredith Pel, MD;  Location: [facility];  Service: Orthopedics;  Laterality: Right;  Marland Kitchen VASECTOMY      There were no vitals filed for this visit.      Subjective Assessment - 08/02/17 0908    Subjective Patient reports he cancelled earlier in the week due to vertigo, reports that he is still experiencing some dizziness today and wants [place] to try the Epley  manuever   Currently in Pain? No/denies                Vestibular Assessment - 08/02/17 0001      Symptom Behavior   Type of Dizziness "Funny feeling in head"   Frequency of Dizziness last two days   Duration of Dizziness all day at times   Aggravating Factors Looking up to the ceiling   Relieving Factors Rest;Closing eyes     Occulomotor Exam   Occulomotor Alignment Normal     Positional Testing   Dix-Hallpike Dix-Hallpike Right;Dix-Hallpike Left     Dix-Hallpike Right   Dix-Hallpike Right Symptoms No nystagmus     Dix-Hallpike Left   Dix-Hallpike Left Symptoms No nystagmus                 [facility] Adult PT Treatment/Exercise - 08/02/17 0001      Manual Therapy   Manual Therapy Passive ROM   Passive ROM PROM of the right shoulder ER to pain, flexion, abduction, horizontal abd. and adduction and IR for good stretch, no pain.         Vestibular Treatment/Exercise - 08/02/17 0001      Vestibular Treatment/Exercise   Habituation Exercises Laruth Bouchard Daroff;Horizontal Roll     [employer]  Number of Reps  5   Symptom Description  "feel funny"     Horizontal Roll   Number of Reps  5   Symptom Description  none               PT Education - 08/02/17 0915    Education provided Yes   Education Details gave Kathrynn Humble and [employer] for habituation exercises   Person(s) Educated Patient   Methods Explanation;Demonstration;Handout   Comprehension Verbalized understanding          PT Short Term Goals - 06/13/17 0919      PT SHORT TERM GOAL #1   Title independent with initial HEP   Status Achieved           PT Long Term Goals - 08/02/17 0915      PT LONG TERM GOAL #5   Title lift 3# to shoulder height shelf   Status Partially Met     PT LONG TERM GOAL #6   Title Increase AROM in right shoulder to 135 degrees.    Status On-going     PT LONG TERM GOAL #7   Title Decrease pain in right shoulder with reaching overhead or  reaching out front with extended elbow by 50%.    Status On-going               Plan - 08/02/17 0913    Clinical Impression Statement We may see him 3x next week, he saw his MD and the MD was pleased with his progressed but felt like we could push him harder.  His dizziness is the biggest issue for the past 3-4 days.  The Epley manuever today was negative for both sides but after we did it he reported having less "dizzy feelings".   PT Next Visit Plan May work him harder with ROM and functional strength, treat vertigo as needed      Patient will benefit from skilled therapeutic intervention in order to improve the following deficits and impairments:  Decreased range of motion, Decreased strength, Increased edema, Postural dysfunction, Improper body mechanics, Pain, Increased muscle spasms, Impaired UE functional use  Visit Diagnosis: Acute pain of right shoulder  Stiffness of right shoulder, not elsewhere classified  Dizziness and giddiness     Problem List Patient Active Problem List   Diagnosis Date Noted  . Shoulder arthritis 05/23/2017  . Hereditary and idiopathic peripheral neuropathy 03/24/2015  . 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 Pain of right heel 12/18/2012    Sumner Boast., PT 08/02/2017, 9:34 AM  [place], [place], [address] Phone: [phone]   Fax:  [phone]  Name: George Freeman MRN: [id number] Date of Birth: 1946/10/27

## 2017-08-04 ENCOUNTER — Encounter: Payer: Self-pay | Admitting: Physical Therapy

## 2017-08-04 ENCOUNTER — Ambulatory Visit: Payer: Medicare Other | Admitting: Physical Therapy

## 2017-08-04 DIAGNOSIS — M25511 Pain in right shoulder: Secondary | ICD-10-CM

## 2017-08-04 DIAGNOSIS — R42 Dizziness and giddiness: Secondary | ICD-10-CM

## 2017-08-04 DIAGNOSIS — M25611 Stiffness of right shoulder, not elsewhere classified: Secondary | ICD-10-CM

## 2017-08-04 NOTE — Therapy (Signed)
[facility] [address] [place], [place], [address] Phone: [phone]   Fax:  [phone]  Physical Therapy Treatment  Patient Details  Name: George Freeman MRN: [id number] Date of Birth: 1946-02-13 Referring Provider: August Saucerean  Encounter Date: 08/04/2017      PT End of Session - 08/04/17 1030    Visit Number 18   Date for PT Re-Evaluation 08/06/17   PT Start Time 0845   PT Stop Time 0945   PT Time Calculation (min) 60 min   Activity Tolerance Patient tolerated treatment well   Behavior [facility] Therapy [facility] for tasks assessed/performed      Past Medical History:  Diagnosis Date  . Anxiety   . Arthritis   . BPV (benign positional vertigo)   . Depression   . GERD (gastroesophageal reflux disease)   . Neuromuscular disorder ([facility])   . Panic disorder   . PONV (postoperative nausea and vomiting)    chills and anxiety after surgery    Past Surgical History:  Procedure Laterality Date  . ANAL FISSURE REPAIR  1993  . APPENDECTOMY  1969  . 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 BACK SURGERY  2011   Stenosis, sciatica  . BACK SURGERY  December 2014  . CERVICAL SPINE SURGERY  Feb. 2014  . CERVICAL SPINE SURGERY  07/23/15   fusion  . COLONOSCOPY    . KNEE SURGERY     2x- cartilage  . left foot surgery  1985  . Left shoulder surgery  1990  . Right shoulder surgery  2012  . TOTAL KNEE ARTHROPLASTY  January 2011  . TOTAL KNEE ARTHROPLASTY  Feb. 2012  . TOTAL SHOULDER ARTHROPLASTY Right 05/23/2017   Procedure: RIGHT TOTAL SHOULDER ARTHROPLASTY;  Surgeon: Cammy Copaean, Scott Gregory, MD;  [facility];  Service: Orthopedics;  Laterality: Right;  Marland Kitchen. VASECTOMY      There were no vitals filed for this visit.      Subjective Assessment - 08/04/17 0847    Subjective Pt reports that he is feeling a lot better today, still recovering a little from his vertigo symptoms but feeling better overall. Said that he shoulder  is feeling good today.    Patient Stated Goals would like to be able to shoot a basketball, dress without difficulty   Currently in Pain? No/denies   Pain Score 0-No pain                         [facility] Adult PT Treatment/Exercise - 08/04/17 0001      Shoulder Exercises: Standing   Other Standing Exercises 1# flexion, abduction to 3 level shelves 1x7, 3# 1x3   Other Standing Exercises shooting red medicine ball like basketball 2x15, circles in flexion and abduction with 2# 2x10     Shoulder Exercises: ROM/Strengthening   UBE (Upper Arm Bike) level 6, 3 min forward and backward   Cybex Row Limitations 35# 2x15   Other ROM/Strengthening Exercises Lat pull downs 35# 2x15   Other ROM/Strengthening Exercises  triceps #35 2x15; biceps 10# 2x15     Cryotherapy   Number Minutes Cryotherapy 15 Minutes   Cryotherapy Location Shoulder   Type of Cryotherapy Ice pack     Electrical Stimulation   Electrical Stimulation Location right shoulder   Electrical Stimulation Action IFC   Electrical Stimulation Parameters sitting   Electrical Stimulation Goals Pain     Manual Therapy  Manual Therapy Passive ROM   Passive ROM PROM of the right shoulder ER to pain, flexion, abduction, horizontal abd. and adduction and IR for good stretch, no pain.                  PT Short Term Goals - 06/13/17 0919      PT SHORT TERM GOAL #1   Title independent with initial HEP   Status Achieved           PT Long Term Goals - 08/04/17 0911      PT LONG TERM GOAL #5   Title lift 3# to shoulder height shelf   Time 12   Period Weeks   Status Achieved               Plan - 08/04/17 1030    Clinical Impression Statement Pt is improving with his strength. We were able to progress the weight of his exercises and he had much less difficulty stabilizing weight with an extended arm and lifting it to shelf height. He continues to have less pain with end range stretches, mostly  now just with ER.   PT Treatment/Interventions ADLs/Self Care Home Management;Cryotherapy;Electrical Stimulation;Moist Heat;Therapeutic activities;Therapeutic exercise;Neuromuscular re-education;Patient/family education;Manual techniques   PT Next Visit Plan May work him harder with ROM and functional strength, treat vertigo as needed   PT Home Exercise Plan Continue with two-way scapular retraction with red Theraband, and ER/IR side-stepping with yellow. Continue HEP given for vacation including rhythmic stabilization, shooting basketball, active flexion, circles, wand exercises, and how to modify if he has pain.    Consulted and Agree with Plan of Care Patient      Patient will benefit from skilled therapeutic intervention in order to improve the following deficits and impairments:  Decreased range of motion, Decreased strength, Increased edema, Postural dysfunction, Improper body mechanics, Pain, Increased muscle spasms, Impaired UE functional use  Visit Diagnosis: Acute pain of right shoulder  Stiffness of right shoulder, not elsewhere classified  Dizziness and giddiness     Problem List Patient Active Problem List   Diagnosis Date Noted  . Shoulder arthritis 05/23/2017  . Hereditary and idiopathic peripheral neuropathy 03/24/2015  . 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 Pain of right heel 12/18/2012     Carin Primrose, SPT 08/04/2017, 10:38 AM  [facility] [address] [place], [place], [address] Phone: [phone]   Fax:  [phone]  Name: George Freeman MRN: [id number] Date of Birth: 1946/02/01

## 2017-08-07 ENCOUNTER — Encounter: Payer: Self-pay | Admitting: Physical Therapy

## 2017-08-07 ENCOUNTER — Ambulatory Visit: Payer: Medicare Other | Admitting: Physical Therapy

## 2017-08-07 DIAGNOSIS — R42 Dizziness and giddiness: Secondary | ICD-10-CM

## 2017-08-07 DIAGNOSIS — M25611 Stiffness of right shoulder, not elsewhere classified: Secondary | ICD-10-CM

## 2017-08-07 DIAGNOSIS — M25511 Pain in right shoulder: Secondary | ICD-10-CM

## 2017-08-07 NOTE — Therapy (Signed)
[place], [place], [address] Phone: [phone]   Fax:  [phone]  Physical Therapy Treatment  Patient Details  Name: [name] MRN: [id number] Date of Birth: [birth year] Referring Provider: Marlou Sa  Encounter Date: 08/07/2017      PT End of Session - 08/07/17 1034    Visit Number 19   Date for PT Re-Evaluation 08/06/17   PT Start Time 0930   PT Stop Time 1030   PT Time Calculation (min) 60 min   Activity Tolerance Patient tolerated treatment well   Behavior During Therapy [facility] for tasks assessed/performed      Past Medical History:  Diagnosis Date  . Anxiety   . Arthritis   . BPV (benign positional vertigo)   . Depression   . GERD (gastroesophageal reflux disease)   . Neuromuscular disorder ([place])   . Panic disorder   . PONV (postoperative nausea and vomiting)    chills and anxiety after surgery    Past Surgical History:  Procedure Laterality Date  . ANAL FISSURE REPAIR  1993  . APPENDECTOMY  1969  . 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 BACK SURGERY  2011   Stenosis, sciatica  . BACK SURGERY  December 2014  . CERVICAL SPINE SURGERY  Feb. 2014  . CERVICAL SPINE SURGERY  07/23/15   fusion  . COLONOSCOPY    . KNEE SURGERY     2x- cartilage  . left foot surgery  1985  . Left shoulder surgery  1990  . Right shoulder surgery  2012  . TOTAL KNEE ARTHROPLASTY  January 2011  . TOTAL KNEE ARTHROPLASTY  Feb. 2012  . TOTAL SHOULDER ARTHROPLASTY Right 05/23/2017   Procedure: RIGHT TOTAL SHOULDER ARTHROPLASTY;  Surgeon: Meredith Pel, MD;  Location: [facility];  Service: Orthopedics;  Laterality: Right;  Marland Kitchen VASECTOMY      There were no vitals filed for this visit.      Subjective Assessment - 08/07/17 0934    Subjective Pt is doing well. He played golf this weekend and it went pretty well. He has some tightness today. Also reported some pain when shooting a regulation  basketball this weekend.    Patient Stated Goals would like to be able to shoot a basketball, dress without difficulty   Currently in Pain? No/denies   Pain Score 0-No pain            [facility] PT Assessment - 08/07/17 0001      AROM   Right Shoulder Flexion 130 Degrees                     [facility] Adult PT Treatment/Exercise - 08/07/17 0001      Shoulder Exercises: Standing   External Rotation 15 reps;Theraband   Theraband Level (Shoulder External Rotation) Level 1 (Yellow)  active - muscle spasms with ER   Internal Rotation 15 reps;Theraband   Theraband Level (Shoulder Internal Rotation) Level 1 (Yellow)   Other Standing Exercises shooting green medicine ball like basketball 2x15     Shoulder Exercises: ROM/Strengthening   UBE (Upper Arm Bike) level 6, 3 min forward and backward   Cybex Row Limitations 35# 2x15   Rhythmic Stabilization, Supine standing with bilat arms in flexion 1x10 yellow ball, right UE 1x10 green ball   Other ROM/Strengthening Exercises Lat pull downs 35# 2x15   Other ROM/Strengthening Exercises  triceps #35 2x15; biceps 10# 1x15, 15#  1x15     Cryotherapy   Number Minutes Cryotherapy 15 Minutes   Cryotherapy Location Shoulder   Type of Cryotherapy Ice pack     Electrical Stimulation   Electrical Stimulation Location right shoulder   Electrical Stimulation Action IFC   Electrical Stimulation Parameters sitting   Electrical Stimulation Goals Pain     Manual Therapy   Manual Therapy Passive ROM   Passive ROM PROM of the right shoulder ER to pain, flexion, abduction, horizontal abd. and adduction and IR for good stretch, no pain.                  PT Short Term Goals - 06/13/17 0919      PT SHORT TERM GOAL #1   Title independent with initial HEP   Status Achieved           PT Long Term Goals - 08/07/17 1041      PT LONG TERM GOAL #6   Title Increase AROM in right shoulder to 135 degrees.    Baseline R shoulder flexion -  130 degrees    Time 5   Period Weeks   Status Partially Met               Plan - 08/07/17 1035    Clinical Impression Statement Pt continues to do well. His ROM is still improving and he is having less pain with ER stretches.  He still has difficulty with active ER with shoulder adducted, but with shoulder abducted he is able to get more movement. Pt believes his biceps spasms and pain are slowing down his recovery from his surgery.    PT Treatment/Interventions ADLs/Self Care Home Management;Cryotherapy;Electrical Stimulation;Moist Heat;Therapeutic activities;Therapeutic exercise;Neuromuscular re-education;Patient/family education;Manual techniques   PT Next Visit Plan May work him harder with ROM and functional strength, treat vertigo as needed, shooting basketball outside, may go to his gym to help him understand what to do there.    PT Home Exercise Plan Continue with two-way scapular retraction with red Theraband, and ER/IR side-stepping with yellow. Continue HEP given for vacation including rhythmic stabilization, shooting basketball, active flexion, circles, wand exercises, and how to modify if he has pain.    Consulted and Agree with Plan of Care Patient      Patient will benefit from skilled therapeutic intervention in order to improve the following deficits and impairments:  Decreased range of motion, Decreased strength, Increased edema, Postural dysfunction, Improper body mechanics, Pain, Increased muscle spasms, Impaired UE functional use  Visit Diagnosis: Acute pain of right shoulder  Stiffness of right shoulder, not elsewhere classified  Dizziness and giddiness     Problem List Patient Active Problem List   Diagnosis Date Noted  . Shoulder arthritis 05/23/2017  . Hereditary and idiopathic peripheral neuropathy 03/24/2015  . 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 Pain of right heel 12/18/2012    [name], SPT 08/07/2017,  10:46 AM  [place], [place], [address] Phone: [phone]   Fax:  [phone]  Name: George Freeman MRN: [id number] Date of Birth: [birth year]

## 2017-08-14 ENCOUNTER — Ambulatory Visit: Payer: Medicare Other | Admitting: Physical Therapy

## 2017-08-14 ENCOUNTER — Encounter: Payer: Self-pay | Admitting: Physical Therapy

## 2017-08-14 DIAGNOSIS — R2231 Localized swelling, mass and lump, right upper limb: Secondary | ICD-10-CM

## 2017-08-14 DIAGNOSIS — M25611 Stiffness of right shoulder, not elsewhere classified: Secondary | ICD-10-CM

## 2017-08-14 DIAGNOSIS — M25511 Pain in right shoulder: Secondary | ICD-10-CM

## 2017-08-14 NOTE — Therapy (Signed)
[place], [place], [address] Phone: [phone]   Fax:  [phone]  Physical Therapy Treatment  Patient Details  Name: George Freeman MRN: [id number] Date of Birth: [birth year] Referring Provider: Marlou Sa  Encounter Date: 08/14/2017      PT End of Session - 08/14/17 1448    Visit Number 20   Date for PT Re-Evaluation 09/06/17   PT Start Time 8841   PT Stop Time 1458   PT Time Calculation (min) 62 min   Activity Tolerance Patient tolerated treatment well   Behavior During Therapy [facility] for tasks assessed/performed      Past Medical History:  Diagnosis Date  . Anxiety   . Arthritis   . BPV (benign positional vertigo)   . Depression   . GERD (gastroesophageal reflux disease)   . Neuromuscular disorder ([place])   . Panic disorder   . PONV (postoperative nausea and vomiting)    chills and anxiety after surgery    Past Surgical History:  Procedure Laterality Date  . ANAL FISSURE REPAIR  1993  . APPENDECTOMY  1969  . 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 BACK SURGERY  2011   Stenosis, sciatica  . BACK SURGERY  December 2014  . CERVICAL SPINE SURGERY  Feb. 2014  . CERVICAL SPINE SURGERY  07/23/15   fusion  . COLONOSCOPY    . KNEE SURGERY     2x- cartilage  . left foot surgery  1985  . Left shoulder surgery  1990  . Right shoulder surgery  2012  . TOTAL KNEE ARTHROPLASTY  January 2011  . TOTAL KNEE ARTHROPLASTY  Feb. 2012  . TOTAL SHOULDER ARTHROPLASTY Right 05/23/2017   Procedure: RIGHT TOTAL SHOULDER ARTHROPLASTY;  Surgeon: Meredith Pel, MD;  Location: [facility];  Service: Orthopedics;  Laterality: Right;  Marland Kitchen VASECTOMY      There were no vitals filed for this visit.      Subjective Assessment - 08/14/17 1357    Subjective Reports that he tried two days at his gym and had no difficulty,   Currently in Pain? No/denies                         [facility] Adult  PT Treatment/Exercise - 08/14/17 0001      Shoulder Exercises: Seated   Other Seated Exercises 4# bent over row, 2# extension and 2# bent over fly   Other Seated Exercises 2# ER at 90 degrees abduction,      Shoulder Exercises: Standing   Other Standing Exercises 3# overhead carry, rebounder throwing, hands on mat weight bearing   Other Standing Exercises shooting green/red medicine ball like basketball 2x15 10' target, foot ball throw, (NERF)     Shoulder Exercises: ROM/Strengthening   Wall Pushups 20 reps     Vasopneumatic   Number Minutes Vasopneumatic  15 minutes   Vasopnuematic Location  Shoulder   Vasopneumatic Pressure Medium   Vasopneumatic Temperature  35     Manual Therapy   Manual Therapy Passive ROM   Passive ROM PROM of the right shoulder ER to pain, flexion, abduction, horizontal abd. and adduction and IR for good stretch, no pain.                  PT Short Term Goals - 06/13/17 0919      PT SHORT TERM GOAL #1   Title independent with  initial HEP   Status Achieved           PT Long Term Goals - 08/07/17 1041      PT LONG TERM GOAL #6   Title Increase AROM in right shoulder to 135 degrees.    Baseline R shoulder flexion - 130 degrees    Time 5   Period Weeks   Status Partially Met               Plan - 2017-08-28 1450    Clinical Impression Statement ROM is tight at end ranges especially IR, much improved for him to do selfd hygeine, and better with shooting a light ball.  He is making progress but still having some sharp pain with ER and throwing a small ball and with ER strength, the biceps does continue to have some pain.   PT Next Visit Plan may decreased next week to 1x/week, work on IR and ER   Consulted and Agree with Plan of Care Patient      Patient will benefit from skilled therapeutic intervention in order to improve the following deficits and impairments:  Decreased range of motion, Decreased strength, Increased edema,  Postural dysfunction, Improper body mechanics, Pain, Increased muscle spasms, Impaired UE functional use  Visit Diagnosis: Acute pain of right shoulder  Stiffness of right shoulder, not elsewhere classified  Localized swelling, mass and lump, right upper limb       G-Codes - 2017-08-28 1452    Functional Assessment Tool Used (Outpatient Only) foto 43% limitation   Functional Limitation Other PT primary   Other PT Primary Current Status (E3212) At least 40 percent but less than 60 percent impaired, limited or restricted   Other PT Primary Goal Status (Y4825) At least 40 percent but less than 60 percent impaired, limited or restricted      Problem List Patient Active Problem List   Diagnosis Date Noted  . Shoulder arthritis 05/23/2017  . Hereditary and idiopathic peripheral neuropathy 03/24/2015  . 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 Pain of right heel 12/18/2012    Sumner Boast., PT 08-28-17, 2:52 PM  [place], [place], [address] Phone: [phone]   Fax:  [phone]  Name: JAMARKIS BRANAM MRN: [id number] Date of Birth: [birth year]

## 2017-08-16 ENCOUNTER — Ambulatory Visit: Payer: Medicare Other | Admitting: Physical Therapy

## 2017-08-16 ENCOUNTER — Encounter: Payer: Self-pay | Admitting: Physical Therapy

## 2017-08-16 DIAGNOSIS — M25511 Pain in right shoulder: Secondary | ICD-10-CM

## 2017-08-16 DIAGNOSIS — M25611 Stiffness of right shoulder, not elsewhere classified: Secondary | ICD-10-CM

## 2017-08-16 NOTE — Therapy (Signed)
[place], [place], [address] Phone: [phone]   Fax:  [phone]  Physical Therapy Treatment  Patient Details  Name: George Freeman MRN: [id number] Date of Birth: 1946/03/17 Referring Provider: Marlou Sa  Encounter Date: 08/16/2017      PT End of Session - 08/16/17 0924    Visit Number 21   Date for PT Re-Evaluation 09/06/17   PT Start Time 0845   PT Stop Time 0930   PT Time Calculation (min) 45 min   Activity Tolerance Patient tolerated treatment well   Behavior During Therapy [facility] for tasks assessed/performed      Past Medical History:  Diagnosis Date  . Anxiety   . Arthritis   . BPV (benign positional vertigo)   . Depression   . GERD (gastroesophageal reflux disease)   . Neuromuscular disorder ([place])   . Panic disorder   . PONV (postoperative nausea and vomiting)    chills and anxiety after surgery    Past Surgical History:  Procedure Laterality Date  . ANAL FISSURE REPAIR  1993  . APPENDECTOMY  1969  . 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 BACK SURGERY  2011   Stenosis, sciatica  . BACK SURGERY  December 2014  . CERVICAL SPINE SURGERY  Feb. 2014  . CERVICAL SPINE SURGERY  07/23/15   fusion  . COLONOSCOPY    . KNEE SURGERY     2x- cartilage  . left foot surgery  1985  . Left shoulder surgery  1990  . Right shoulder surgery  2012  . TOTAL KNEE ARTHROPLASTY  January 2011  . TOTAL KNEE ARTHROPLASTY  Feb. 2012  . TOTAL SHOULDER ARTHROPLASTY Right 05/23/2017   Procedure: RIGHT TOTAL SHOULDER ARTHROPLASTY;  Surgeon: Meredith Pel, MD;  Location: [place];  Service: Orthopedics;  Laterality: Right;  Marland Kitchen VASECTOMY      There were no vitals filed for this visit.      Subjective Assessment - 08/16/17 0926    Subjective "{Pretty sore from doing different stuff"   Currently in Pain? No/denies                         [facility] Adult PT Treatment/Exercise  - 08/16/17 0001      Shoulder Exercises: Seated   Other Seated Exercises 4# bent over row, 2# extension and 2# bent over fly   Other Seated Exercises 2# ER at 90 degrees abduction,      Shoulder Exercises: Standing   Other Standing Exercises 3# overhead carry, rebounder throwing, hands on mat weight bearing   Other Standing Exercises shooting green/red medicine ball like basketball 2x15 10' target, foot ball throw, (NERF)     Shoulder Exercises: ROM/Strengthening   UBE (Upper Arm Bike) constant work 30 watts x 4 minutes   Wall Pushups 20 reps   Other ROM/Strengthening Exercises straight arm pull downs 15#, bow pulls 15# to face, fitter side to side with 2 blue bands, then front to back   Other ROM/Strengthening Exercises  triceps #35 2x15; biceps 10# 1x15, 15# 1x15, weighted ball pass around waist for IR, ball throwing, with focus on ER     Manual Therapy   Manual Therapy Passive ROM   Passive ROM PROM of the right shoulder ER to pain, flexion, abduction, horizontal abd. and adduction and IR for good stretch, no pain.  PT Short Term Goals - 06/13/17 0919      PT SHORT TERM GOAL #1   Title independent with initial HEP   Status Achieved           PT Long Term Goals - 08/16/17 0926      PT LONG TERM GOAL #6   Title Increase AROM in right shoulder to 135 degrees.    Status Partially Met     PT LONG TERM GOAL #7   Title Decrease pain in right shoulder with reaching overhead or reaching out front with extended elbow by 50%.    Status Partially Met               Plan - 08/16/17 0925    Clinical Impression Statement He reports that he was sore after the last treatment but "in a good way"  I have asked him to do more at home, he reports that he was too sore to try this week but will try over the weekend   PT Next Visit Plan may decreased next week to 1x/week, work on [employer] and ER   Consulted and Agree with Plan of Care Patient      Patient will  benefit from skilled therapeutic intervention in order to improve the following deficits and impairments:  Decreased range of motion, Decreased strength, Increased edema, Postural dysfunction, Improper body mechanics, Pain, Increased muscle spasms, Impaired UE functional use  Visit Diagnosis: Acute pain of right shoulder  Stiffness of right shoulder, not elsewhere classified     Problem List Patient Active Problem List   Diagnosis Date Noted  . Shoulder arthritis 05/23/2017  . Hereditary and idiopathic peripheral neuropathy 03/24/2015  . 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 Pain of right heel 12/18/2012    Sumner Boast., PT 08/16/2017, 9:27 AM  [facility] [place], [place], [address] Phone: [phone]   Fax:  [phone]  Name: George Freeman MRN: [id number] Date of Birth: [birth year]

## 2017-08-21 ENCOUNTER — Encounter: Payer: Self-pay | Admitting: Physical Therapy

## 2017-08-21 ENCOUNTER — Ambulatory Visit: Payer: Medicare Other | Admitting: Physical Therapy

## 2017-08-21 DIAGNOSIS — M25611 Stiffness of right shoulder, not elsewhere classified: Secondary | ICD-10-CM

## 2017-08-21 DIAGNOSIS — M25511 Pain in right shoulder: Secondary | ICD-10-CM | POA: Diagnosis not present

## 2017-08-21 NOTE — Therapy (Signed)
[facility] [address] [place], [place], [address] Phone: [phone]   Fax:  [phone]  Physical Therapy Treatment  Patient Details  Name: George Freeman MRN: [id number] Date of Birth: 12/06/1946 Referring Provider: Dean  Encounter Date: 08/21/2017      PT End of Session - 08/21/17 0928    Visit Number 22   Date for PT Re-Evaluation 09/06/17   PT Start Time 0758   PT Stop Time 0844   PT Time Calculation (min) 46 min   Activity Tolerance Patient tolerated treatment well   Behavior During Therapy [facility]FL for tasks assessed/performed      Past Medical History:  Diagnosis Date  . Anxiety   . Arthritis   . BPV (benign positional vertigo)   . Depression   . GERD (gastroesophageal reflux disease)   . Neuromuscular disorder ([facility])   . Panic disorder   . PONV (postoperative nausea and vomiting)    chills and anxiety after surgery    Past Surgical History:  Procedure Laterality Date  . ANAL FISSURE REPAIR  1993  . APPENDECTOMY  1969  . 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 BACK SURGERY  2011   Stenosis, sciatica  . BACK SURGERY  December 2014  . CERVICAL SPINE SURGERY  Feb. 2014  . CERVICAL SPINE SURGERY  07/23/15   fusion  . COLONOSCOPY    . KNEE SURGERY     2x- cartilage  . left foot surgery  1985  . Left shoulder surgery  1990  . Right shoulder surgery  2012  . TOTAL KNEE ARTHROPLASTY  January 2011  . TOTAL KNEE ARTHROPLASTY  Feb. 2012  . TOTAL SHOULDER ARTHROPLASTY Right 05/23/2017   Procedure: RIGHT TOTAL SHOULDER ARTHROPLASTY;  Surgeon: Dean, Scott Gregory, MD;  Location: [facility];  Service: Orthopedics;  Laterality: Right;  . VASECTOMY      There were no vitals filed for this visit.      Subjective Assessment - 08/21/17 0758    Subjective Patient reports that he is getting discouraged, I don't seem to have a lot of carry over and I am having increased soreness.  I talked to him about us  changing the exercises a lot and that this could be causing the soreness   Currently in Pain? No/denies                         [facility] Adult PT Treatment/Exercise - 08/21/17 0001      Shoulder Exercises: Standing   Other Standing Exercises shooting green/red medicine ball like basketball 2x15 10' target, foot ball throw, (NERF)     Shoulder Exercises: ROM/Strengthening   UBE (Upper Arm Bike) constant work 30 watts x 4 minutes   Wall Wash worked on scaption and abduction with 1#, 2# and no weight   Wall Pushups 20 reps   "W" Arms 20 reps with 2#   Rhythmic Stabilization, Supine standing with bilat arms in flexion 1x10 yellow ball, right UE 1x10 green ball   Other ROM/Strengthening Exercises straight arm pull downs 15#, bow pulls 15# to face, fitter side to side with 2 blue bands, then front to back   Other ROM/Strengthening Exercises  triceps #35 2x15; biceps 10# 1x15, 15# 1x15, weighted ball pass around waist for IR, ball throwing, with focus on ER     Iontophoresis   Type of Iontophoresis Dexamethasone   Location R arm medial   to deltoid insertion   Dose 80 mA   Time 4 hours     Manual Therapy   Manual Therapy Passive ROM   Passive ROM PROM of the right shoulder ER to pain, flexion, abduction, horizontal abd. and adduction and IR for good stretch, no pain.                  PT Short Term Goals - 06/13/17 0919      PT SHORT TERM GOAL #1   Title independent with initial HEP   Status Achieved           PT Long Term Goals - 08/16/17 0926      PT LONG TERM GOAL #6   Title Increase AROM in right shoulder to 135 degrees.    Status Partially Met     PT LONG TERM GOAL #7   Title Decrease pain in right shoulder with reaching overhead or reaching out front with extended elbow by 50%.    Status Partially Met               Plan - 08/21/17 0929    Clinical Impression Statement Patient having some increased bicep area soreness, worked some on the  >100 degree flexion and abduction eccentrics and small isometric above that ROM.  Tried ionto to the biceps area to see if that helps some of the pain   PT Next Visit Plan see if ionto helps   Consulted and Agree with Plan of Care Patient      Patient will benefit from skilled therapeutic intervention in order to improve the following deficits and impairments:  Decreased range of motion, Decreased strength, Increased edema, Postural dysfunction, Improper body mechanics, Pain, Increased muscle spasms, Impaired UE functional use  Visit Diagnosis: Acute pain of right shoulder  Stiffness of right shoulder, not elsewhere classified     Problem List Patient Active Problem List   Diagnosis Date Noted  . Shoulder arthritis 05/23/2017  . Hereditary and idiopathic peripheral neuropathy 03/24/2015  . 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 Pain of right heel 12/18/2012    Sumner Boast., PT 08/21/2017, 9:32 AM  [facility] [place], [place], [address] Phone: [phone]   Fax:  [phone]  Name: George Freeman MRN: [id number] Date of Birth: 17-Mar-1946

## 2017-08-24 ENCOUNTER — Ambulatory Visit: Payer: Medicare Other | Admitting: Physical Therapy

## 2017-08-24 ENCOUNTER — Encounter: Payer: Self-pay | Admitting: Physical Therapy

## 2017-08-24 DIAGNOSIS — M25611 Stiffness of right shoulder, not elsewhere classified: Secondary | ICD-10-CM

## 2017-08-24 DIAGNOSIS — M25511 Pain in right shoulder: Secondary | ICD-10-CM | POA: Diagnosis not present

## 2017-08-24 NOTE — Therapy (Signed)
[place], [place], [address] Phone: [phone]   Fax:  [phone]  Physical Therapy Treatment  Patient Details  Name: George Freeman MRN: [id number] Date of Birth: [birth year] Referring Provider: Marlou Sa  Encounter Date: 08/24/2017      PT End of Session - 08/24/17 1136    Visit Number 23   Date for PT Re-Evaluation 09/06/17   PT Start Time 0925   PT Stop Time 1015   PT Time Calculation (min) 50 min   Activity Tolerance Patient tolerated treatment well   Behavior During Therapy [facility] for tasks assessed/performed      Past Medical History:  Diagnosis Date  . Anxiety   . Arthritis   . BPV (benign positional vertigo)   . Depression   . GERD (gastroesophageal reflux disease)   . Neuromuscular disorder ([place])   . Panic disorder   . PONV (postoperative nausea and vomiting)    chills and anxiety after surgery    Past Surgical History:  Procedure Laterality Date  . ANAL FISSURE REPAIR  1993  . APPENDECTOMY  1969  . 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 BACK SURGERY  2011   Stenosis, sciatica  . BACK SURGERY  December 2014  . CERVICAL SPINE SURGERY  Feb. 2014  . CERVICAL SPINE SURGERY  07/23/15   fusion  . COLONOSCOPY    . KNEE SURGERY     2x- cartilage  . left foot surgery  1985  . Left shoulder surgery  1990  . Right shoulder surgery  2012  . TOTAL KNEE ARTHROPLASTY  January 2011  . TOTAL KNEE ARTHROPLASTY  Feb. 2012  . TOTAL SHOULDER ARTHROPLASTY Right 05/23/2017   Procedure: RIGHT TOTAL SHOULDER ARTHROPLASTY;  Surgeon: Meredith Pel, MD;  Location: [place];  Service: Orthopedics;  Laterality: Right;  Marland Kitchen VASECTOMY      There were no vitals filed for this visit.      Subjective Assessment - 08/24/17 0929    Subjective Patient reports that he is feeling a little better after the visit with [place] on Monday.  "less soreness"   Currently in Pain? No/denies                          [facility] Adult PT Treatment/Exercise - 08/24/17 0001      Shoulder Exercises: Standing   External Rotation 15 reps;Theraband   Theraband Level (Shoulder External Rotation) Level 2 (Red)   Internal Rotation 15 reps;Theraband   Theraband Level (Shoulder Internal Rotation) Level 2 (Red)   Extension Weights;20 reps;Right   Extension Limitations 15# on pulley   Row Right;20 reps;Weights   Row Weight (lbs) 15# on pulley   Other Standing Exercises overhead walk with pool noodle   Other Standing Exercises shooting green/red medicine ball like basketball 2x15 10' target, foot ball throw, (NERF)     Shoulder Exercises: ROM/Strengthening   UBE (Upper Arm Bike) constant work 30 watts x 4 minutes   Wall Pushups 20 reps   Pushups Limitations standing walkouts wiht hands on the    "W" Arms 20 reps with 2#   Other ROM/Strengthening Exercises straight arm pull downs 15#, bow pulls 15# to face, fitter side to side with 2 blue bands, then front to back   Other ROM/Strengthening Exercises  triceps #35 2x15; biceps 10# 1x15, 15# 1x15, weighted ball pass around waist for IR, ball throwing, with  focus on ER     Iontophoresis   Type of Iontophoresis Dexamethasone   Location R arm medial to deltoid insertion   Dose 80 mA   Time 4 hours #2     Manual Therapy   Manual Therapy Passive ROM   Passive ROM PROM of the right shoulder ER to pain, flexion, abduction, horizontal abd. and adduction and IR for good stretch, no pain.                  PT Short Term Goals - 06/13/17 0919      PT SHORT TERM GOAL #1   Title independent with initial HEP   Status Achieved           PT Long Term Goals - 08/24/17 1138      PT LONG TERM GOAL #7   Title Decrease pain in right shoulder with reaching overhead or reaching out front with extended elbow by 50%.    Status Partially Met               Plan - 08/24/17 1137    Clinical Impression Statement Seems to  have less pain since our treeatment on Monday so we continued the ionto today.  He did better with the ball shooting simulation and had much less pain for all activities today   PT Next Visit Plan will decrease to 1x/week   Consulted and Agree with Plan of Care Patient      Patient will benefit from skilled therapeutic intervention in order to improve the following deficits and impairments:  Decreased range of motion, Decreased strength, Increased edema, Postural dysfunction, Improper body mechanics, Pain, Increased muscle spasms, Impaired UE functional use  Visit Diagnosis: Acute pain of right shoulder  Stiffness of right shoulder, not elsewhere classified     Problem List Patient Active Problem List   Diagnosis Date Noted  . Shoulder arthritis 05/23/2017  . Hereditary and idiopathic peripheral neuropathy 03/24/2015  . 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 Pain of right heel 12/18/2012    Sumner Boast., PT 08/24/2017, 11:39 AM  [place], [place], [address] Phone: [phone]   Fax:  [phone]  Name: George Freeman MRN: [id number] Date of Birth: 1946-08-10

## 2017-08-30 ENCOUNTER — Encounter: Payer: Self-pay | Admitting: Physical Therapy

## 2017-08-30 ENCOUNTER — Ambulatory Visit: Payer: Medicare Other | Attending: Orthopedic Surgery | Admitting: Physical Therapy

## 2017-08-30 DIAGNOSIS — M25611 Stiffness of right shoulder, not elsewhere classified: Secondary | ICD-10-CM | POA: Diagnosis present

## 2017-08-30 DIAGNOSIS — M25511 Pain in right shoulder: Secondary | ICD-10-CM | POA: Diagnosis present

## 2017-08-30 NOTE — Therapy (Signed)
[place], [place], [address] Phone: [phone]   Fax:  [phone]  Physical Therapy Treatment  Patient Details  Name: George Freeman MRN: [id number] Date of Birth: 08/08/1946 Referring Provider: Marlou Sa  Encounter Date: 08/30/2017      PT End of Session - 08/30/17 1010    Visit Number 24   Date for PT Re-Evaluation 09/06/17   PT Start Time 0925   PT Stop Time 1010   PT Time Calculation (min) 45 min   Activity Tolerance Patient tolerated treatment well   Behavior During Therapy [facility] for tasks assessed/performed      Past Medical History:  Diagnosis Date  . Anxiety   . Arthritis   . BPV (benign positional vertigo)   . Depression   . GERD (gastroesophageal reflux disease)   . Neuromuscular disorder ([place])   . Panic disorder   . PONV (postoperative nausea and vomiting)    chills and anxiety after surgery    Past Surgical History:  Procedure Laterality Date  . ANAL FISSURE REPAIR  1993  . APPENDECTOMY  1969  . 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 BACK SURGERY  2011   Stenosis, sciatica  . BACK SURGERY  December 2014  . CERVICAL SPINE SURGERY  Feb. 2014  . CERVICAL SPINE SURGERY  07/23/15   fusion  . COLONOSCOPY    . KNEE SURGERY     2x- cartilage  . left foot surgery  1985  . Left shoulder surgery  1990  . Right shoulder surgery  2012  . TOTAL KNEE ARTHROPLASTY  January 2011  . TOTAL KNEE ARTHROPLASTY  Feb. 2012  . TOTAL SHOULDER ARTHROPLASTY Right 05/23/2017   Procedure: RIGHT TOTAL SHOULDER ARTHROPLASTY;  Surgeon: Meredith Pel, MD;  Location: [place];  Service: Orthopedics;  Laterality: Right;  Marland Kitchen VASECTOMY      There were no vitals filed for this visit.      Subjective Assessment - 08/30/17 0933    Subjective " I just get discouraged, I will reach out and my arm grabs"   Currently in Pain? No/denies            [facility] PT Assessment - 08/30/17 0001      AROM   Right Shoulder Flexion 140 Degrees   Right Shoulder Internal Rotation 40 Degrees                     [facility] Adult PT Treatment/Exercise - 08/30/17 0001      Shoulder Exercises: Seated   Other Seated Exercises 4# bent over row, 2# extension and 2# bent over fly     Shoulder Exercises: Standing   External Rotation 15 reps;Theraband   Theraband Level (Shoulder External Rotation) Level 2 (Red)   Internal Rotation 15 reps;Theraband   Theraband Level (Shoulder Internal Rotation) Level 2 (Red)   Other Standing Exercises overhead walk with pool noodle   Other Standing Exercises shooting green/red medicine ball like basketball 2x15 10' target, foot ball throw, (NERF)     Shoulder Exercises: ROM/Strengthening   UBE (Upper Arm Bike) constant work 35 watts x 4 minutes   Wall Pushups 20 reps   Other ROM/Strengthening Exercises Wall slide and doorway stretches     Manual Therapy   Manual Therapy Soft tissue mobilization;Passive ROM   Soft tissue mobilization STM to the right biceps area   Passive ROM PROM of the right shoulder ER  to pain, flexion, abduction, horizontal abd. and adduction and IR for good stretch, no pain.                  PT Short Term Goals - 06/13/17 0919      PT SHORT TERM GOAL #1   Title independent with initial HEP   Status Achieved           PT Long Term Goals - 08/30/17 1011      PT LONG TERM GOAL #7   Title Decrease pain in right shoulder with reaching overhead or reaching out front with extended elbow by 50%.    Status Partially Met               Plan - 08/30/17 1010    Clinical Impression Statement Patient continues to c/o a sharp "bite" with certain raching motions, the pain is mostly in the right biceps and lateral biceps area.  I worked on this are today to see if we can help this, his ROM continues to improve, his ability to shoot the ball is also better.   PT Next Visit Plan see how the focus on the biceps did  with relief of pain   Consulted and Agree with Plan of Care Patient      Patient will benefit from skilled therapeutic intervention in order to improve the following deficits and impairments:  Decreased range of motion, Decreased strength, Increased edema, Postural dysfunction, Improper body mechanics, Pain, Increased muscle spasms, Impaired UE functional use  Visit Diagnosis: Acute pain of right shoulder  Stiffness of right shoulder, not elsewhere classified     Problem List Patient Active Problem List   Diagnosis Date Noted  . Shoulder arthritis 05/23/2017  . Hereditary and idiopathic peripheral neuropathy 03/24/2015  . 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 Pain of right heel 12/18/2012    Sumner Boast., PT 08/30/2017, 10:14 AM  [place], [place], [address] Phone: [phone]   Fax:  [phone]  Name: George Freeman MRN: [id number] Date of Birth: 1946/08/26

## 2017-09-03 IMAGING — MR MR SHOULDER*R* W/O CM
4 of 5 series · 19 of 40 positions shown · non-contrast
Comparison: MRI 10/31/2012

CLINICAL DATA: Chronic right shoulder pain since prior shoulder
surgery for biceps tendon repair.

EXAM:
MRI OF THE RIGHT SHOULDER WITHOUT CONTRAST
TECHNIQUE: Multiplanar, multisequence MR imaging of the shoulder was performed.
No intravenous contrast was administered.

[Series 5: T2 fat-sat · axial · 4.0mm · 0.27mm/px · z∈[-8,+57]mm · 6 of 18 slices shown]
[im 1/18]
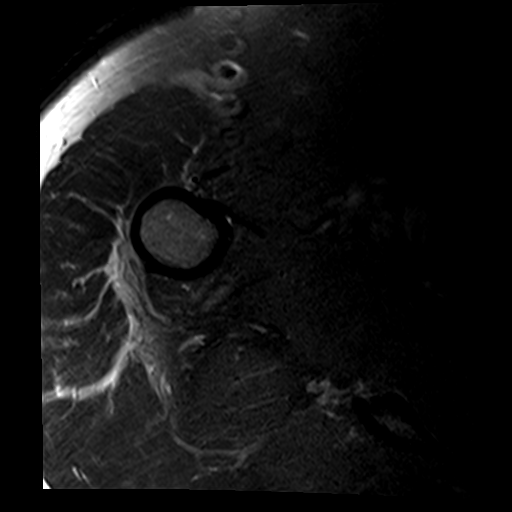
[im 3/18]
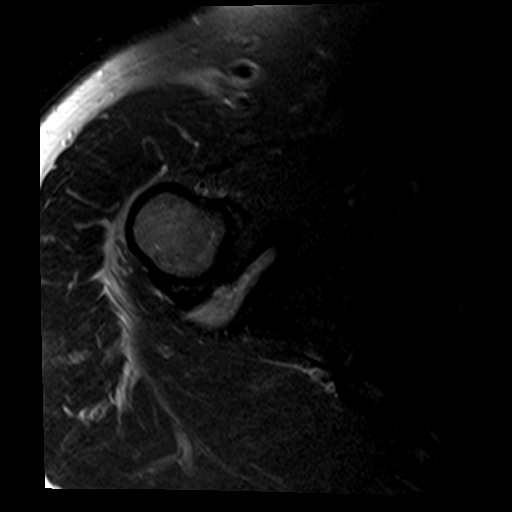
[im 5/18]
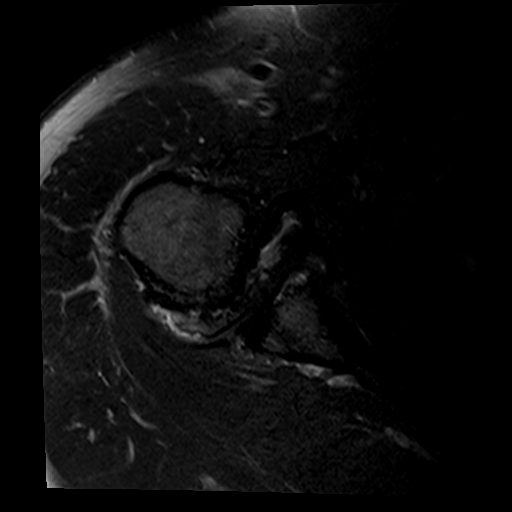
[im 7/18]
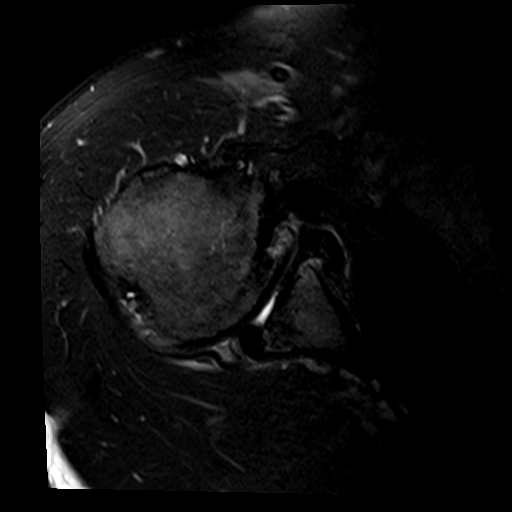
[im 9/18]
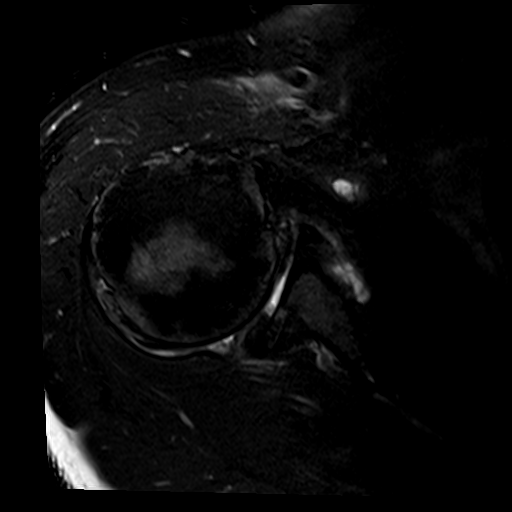
[im 15/18]
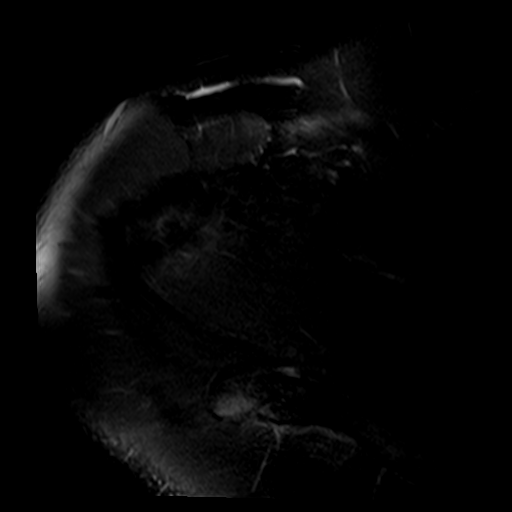

[Series 6: T2 · coronal · 4.0mm · 0.31mm/px · 3 of 16 slices shown]
[im 3/16]
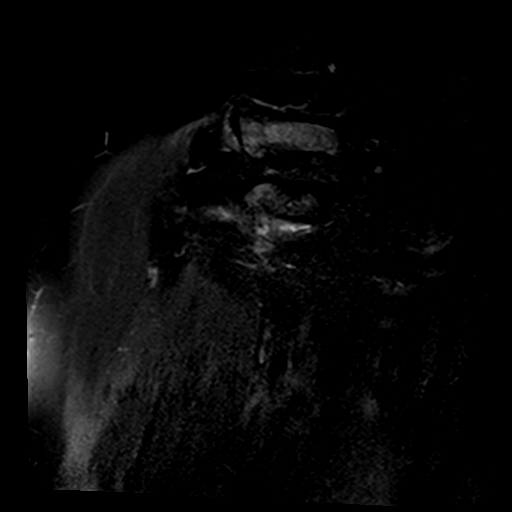
[im 9/16]
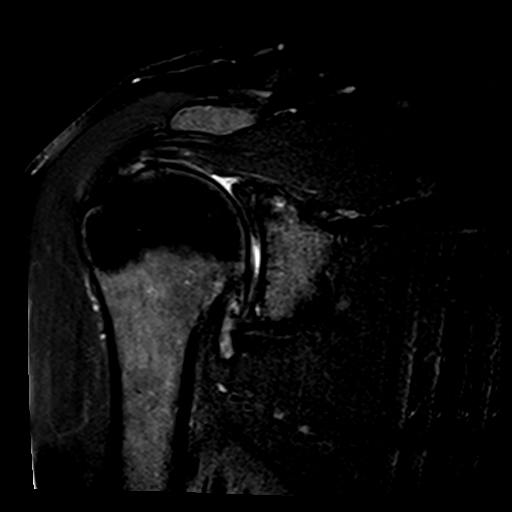
[im 13/16]
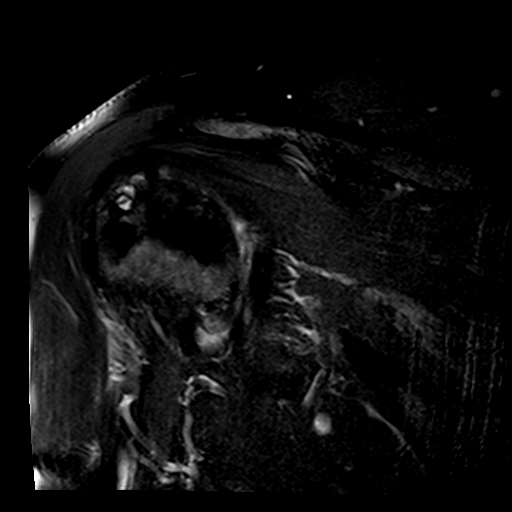

[Series 8: T1 · oblique · 4.0mm · 0.31mm/px · 3 of 18 slices shown]
[im 3/18]
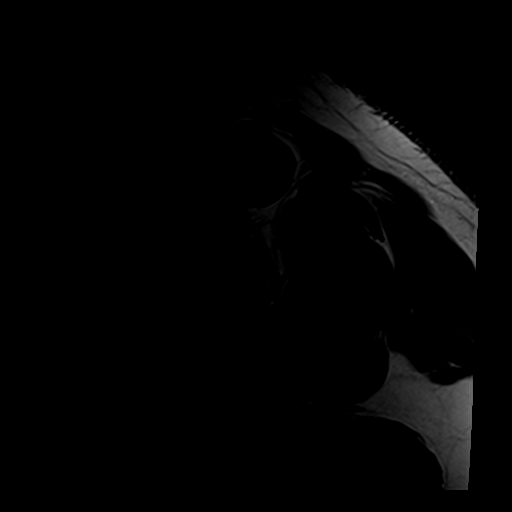
[im 10/18]
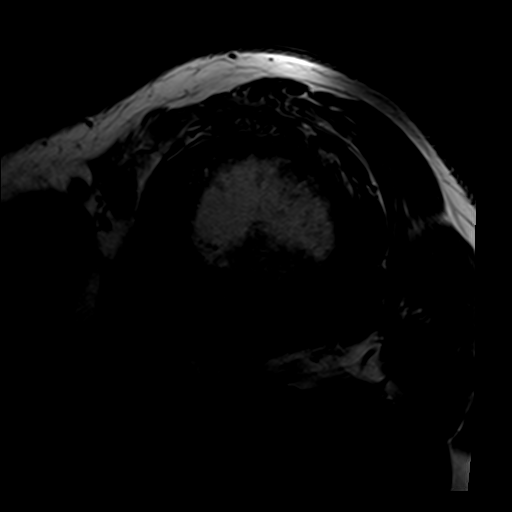
[im 15/18]
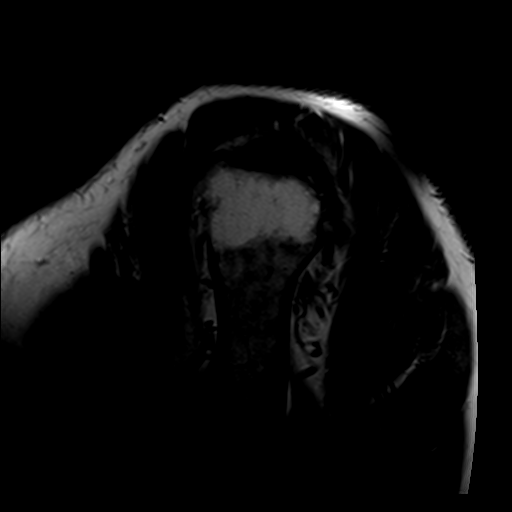

[Series 10: PD · coronal · 4.0mm · 0.31mm/px · 7 of 16 slices shown]
[im 1/16]
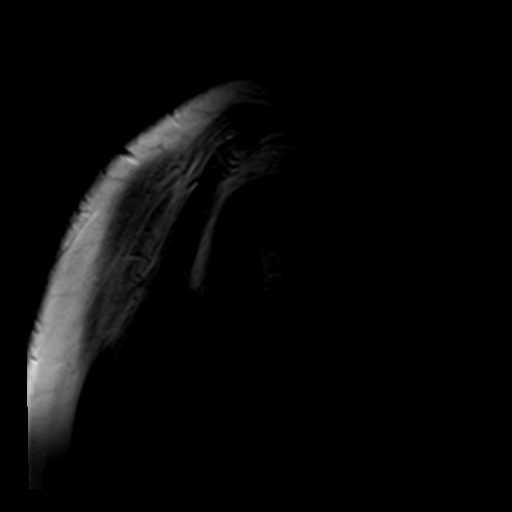
[im 3/16]
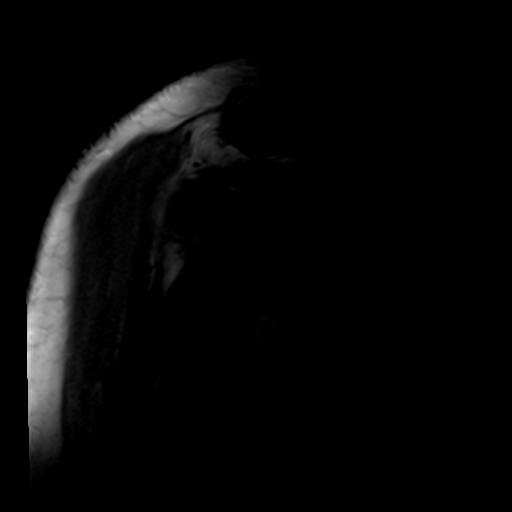
[im 6/16]
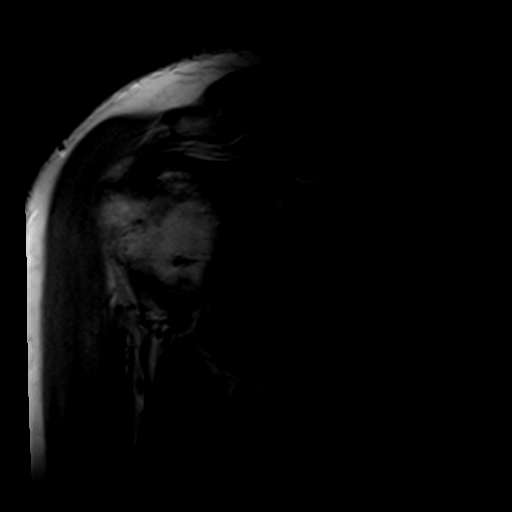
[im 8/16]
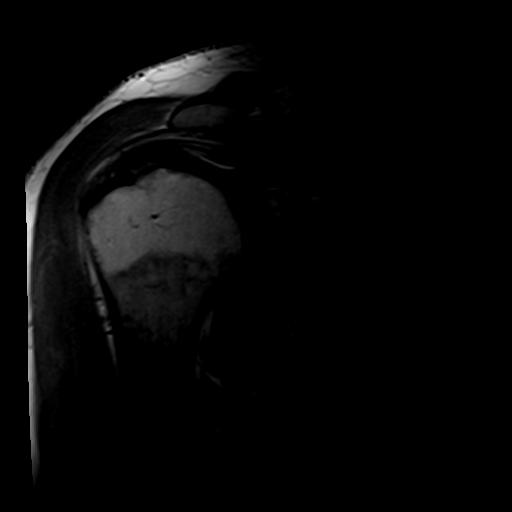
[im 11/16]
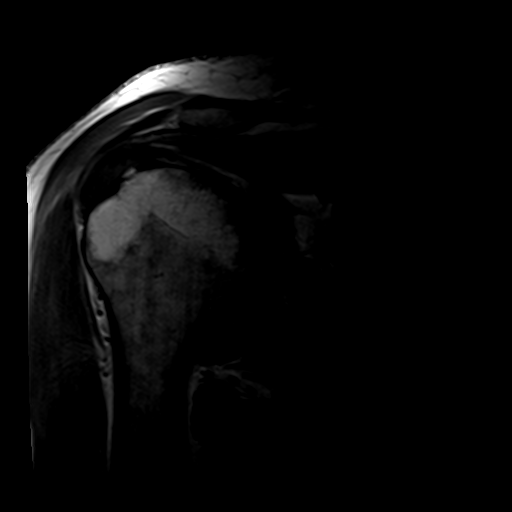
[im 13/16]
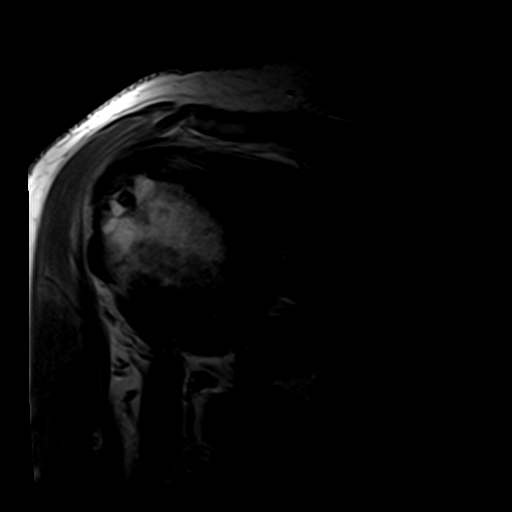
[im 16/16]
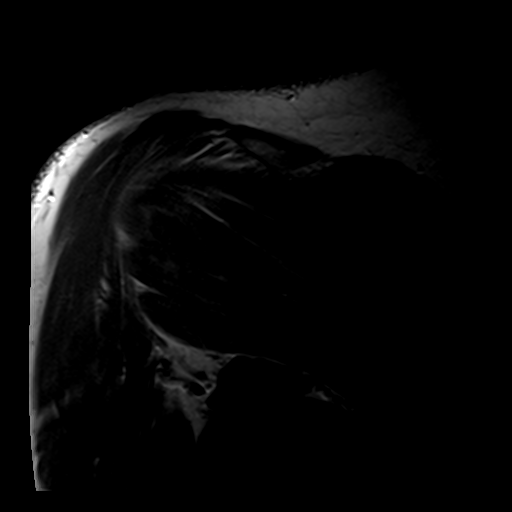

[19 of 40 positions shown; findings below may reference images not displayed]

FINDINGS: Rotator cuff: Advanced rotator cuff tendinopathy/ tendinosis. The
tendons are thickened and edematous in appearance with interstitial
tears. Small articular surface tear involving the supraspinatus
tendon. No full thickness retracted tear.

Muscles:  Normal.

Biceps long head: Remote biceps tendon rupture with tenodesis. I do
not see the long head biceps tendon. Possible rupture from the
repair site.

Acromioclavicular Joint: Advanced degenerative changes. The acromion
is type 2 in shape. No lateral downsloping or undersurface spurring.

Glenohumeral Joint: Advanced glenohumeral joint degenerative changes
with joint space narrowing, osteophytic spurring, areas of full or
near full-thickness cartilage loss and subchondral cystic change
mainly involving the glenoid. There is a small joint effusion and
mild synovitis.

Labrum: The labrum are degenerated. Blunting of the superior labrum
likely due to prior debridement from the biceps tendon rupture.

Bones:  No acute bony findings.
IMPRESSION: 1. Advanced rotator cuff tendinopathy/tendinosis with interstitial
tears and shallow articular surface tears involving the
supraspinatus tendon. No full thickness retracted tear.
2. Remote biceps tendon rupture with tenodesis. Suspect re- rupture
from the repair site. The biceps tendon is not visualized.
3. Advanced glenohumeral joint degenerative changes.
4. Degenerated glenoid labrum.

## 2017-09-07 ENCOUNTER — Encounter: Payer: Self-pay | Admitting: Physical Therapy

## 2017-09-07 ENCOUNTER — Ambulatory Visit: Payer: Medicare Other | Admitting: Physical Therapy

## 2017-09-07 DIAGNOSIS — M25511 Pain in right shoulder: Secondary | ICD-10-CM

## 2017-09-07 DIAGNOSIS — M25611 Stiffness of right shoulder, not elsewhere classified: Secondary | ICD-10-CM

## 2017-09-07 NOTE — Therapy (Signed)
[place], [place], [address] Phone: [phone]   Fax:  [phone]  Physical Therapy Treatment  Patient Details  Name: George Freeman MRN: [id number] Date of Birth: March 09, 1946 Referring Provider: Marlou Sa  Encounter Date: 09/07/2017      PT End of Session - 09/07/17 1251    Visit Number 25   Date for PT Re-Evaluation 10/06/17   PT Start Time 0927   PT Stop Time 1030   PT Time Calculation (min) 63 min   Activity Tolerance Patient tolerated treatment well   Behavior During Therapy [facility] for tasks assessed/performed      Past Medical History:  Diagnosis Date  . Anxiety   . Arthritis   . BPV (benign positional vertigo)   . Depression   . GERD (gastroesophageal reflux disease)   . Neuromuscular disorder ([place])   . Panic disorder   . PONV (postoperative nausea and vomiting)    chills and anxiety after surgery    Past Surgical History:  Procedure Laterality Date  . ANAL FISSURE REPAIR  1993  . APPENDECTOMY  1969  . 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 BACK SURGERY  2011   Stenosis, sciatica  . BACK SURGERY  December 2014  . CERVICAL SPINE SURGERY  Feb. 2014  . CERVICAL SPINE SURGERY  07/23/15   fusion  . COLONOSCOPY    . KNEE SURGERY     2x- cartilage  . left foot surgery  1985  . Left shoulder surgery  1990  . Right shoulder surgery  2012  . TOTAL KNEE ARTHROPLASTY  January 2011  . TOTAL KNEE ARTHROPLASTY  Feb. 2012  . TOTAL SHOULDER ARTHROPLASTY Right 05/23/2017   Procedure: RIGHT TOTAL SHOULDER ARTHROPLASTY;  Surgeon: Meredith Pel, MD;  Location: [facility];  Service: Orthopedics;  Laterality: Right;  Marland Kitchen VASECTOMY      There were no vitals filed for this visit.      Subjective Assessment - 09/07/17 1248    Subjective Patient reports no real changes in the "biting" pain in the bicep   Currently in Pain? No/denies   Aggravating Factors  reaching out and up with a  twist pain will "bite" in the biceps   Pain Relieving Factors no pain at rest and with most ADL's                         [facility] Adult PT Treatment/Exercise - 09/07/17 0001      Shoulder Exercises: Standing   Other Standing Exercises overhead walk with pool noodle   Other Standing Exercises shooting green/red medicine ball like basketball 2x15 10' target, foot ball throw, (NERF)     Shoulder Exercises: ROM/Strengthening   UBE (Upper Arm Bike) constant work 35 watts x 5 minutes   Wall Pushups 20 reps   Rhythmic Stabilization, Supine standing with bilat arms in flexion 1x10 yellow ball, right UE 1x10 green ball     Modalities   Modalities Ultrasound     Moist Heat Therapy   Number Minutes Moist Heat 15 Minutes   Moist Heat Location Shoulder     Electrical Stimulation   Electrical Stimulation Location right biceps area   Electrical Stimulation Action IFC   Electrical Stimulation Parameters sitting   Electrical Stimulation Goals Pain     Ultrasound   Ultrasound Location right biceps   Ultrasound Parameters 100% 1.4 w/cm2   Ultrasound Goals  Pain     Manual Therapy   Passive ROM PROM of the right shoulder ER to pain, flexion, abduction, horizontal abd. and adduction and IR for good stretch, no pain.                  PT Short Term Goals - 06/13/17 0919      PT SHORT TERM GOAL #1   Title independent with initial HEP   Status Achieved           PT Long Term Goals - 09/07/17 1254      PT LONG TERM GOAL #1   Title understand protocol and the progression   Status Achieved     PT LONG TERM GOAL #2   Title decrease pain 50%   Status Achieved     PT LONG TERM GOAL #3   Title increase AROM of right shoulder flexion to 110 degrees   Status Achieved     PT LONG TERM GOAL #4   Title report no difficulty dressing or doing hair   Status Achieved     PT LONG TERM GOAL #5   Title lift 3# to shoulder height shelf   Status Achieved     PT LONG  TERM GOAL #6   Title Increase AROM in right shoulder to 135 degrees.    Status Partially Met               Plan - 09/07/17 1252    Clinical Impression Statement continues with the bite in the biceps with reaching out.  Overall the ROM and function is really good.  He is tight into ER and abduction   PT Next Visit Plan we will hold treatment about 3 weeks, he is to go on vacation and do light stretching only, when he returns he will see the MD   Consulted and Agree with Plan of Care Patient      Patient will benefit from skilled therapeutic intervention in order to improve the following deficits and impairments:  Decreased range of motion, Decreased strength, Increased edema, Postural dysfunction, Improper body mechanics, Pain, Increased muscle spasms, Impaired UE functional use  Visit Diagnosis: Acute pain of right shoulder - Plan: PT plan of care cert/re-cert  Stiffness of right shoulder, not elsewhere classified - Plan: PT plan of care cert/re-cert     Problem List Patient Active Problem List   Diagnosis Date Noted  . Shoulder arthritis 05/23/2017  . Hereditary and idiopathic peripheral neuropathy 03/24/2015  . 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 Pain of right heel 12/18/2012    Sumner Boast., PT 09/07/2017, 12:57 PM  [place], [place], [address] Phone: [phone]   Fax:  [phone]  Name: TAWAN DEGROOTE MRN: [id number] Date of Birth: 01-21-1946

## 2017-09-07 NOTE — Therapy (Deleted)
[place], [place], [address] Phone: [phone]   Fax:  [phone]  Physical Therapy Evaluation  Patient Details  Name: George Freeman MRN: [id number] Date of Birth: Aug 09, 1946 Referring Provider: Marlou Sa  Encounter Date: 09/07/2017      PT End of Session - 09/07/17 1251    Visit Number 25   Date for PT Re-Evaluation 10/06/17   PT Start Time 0927   PT Stop Time 1030   PT Time Calculation (min) 63 min   Activity Tolerance Patient tolerated treatment well   Behavior During Therapy [facility] for tasks assessed/performed      Past Medical History:  Diagnosis Date  . Anxiety   . Arthritis   . BPV (benign positional vertigo)   . Depression   . GERD (gastroesophageal reflux disease)   . Neuromuscular disorder ([place])   . Panic disorder   . PONV (postoperative nausea and vomiting)    chills and anxiety after surgery    Past Surgical History:  Procedure Laterality Date  . ANAL FISSURE REPAIR  1993  . APPENDECTOMY  1969  . 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 BACK SURGERY  2011   Stenosis, sciatica  . BACK SURGERY  December 2014  . CERVICAL SPINE SURGERY  Feb. 2014  . CERVICAL SPINE SURGERY  07/23/15   fusion  . COLONOSCOPY    . KNEE SURGERY     2x- cartilage  . left foot surgery  1985  . Left shoulder surgery  1990  . Right shoulder surgery  2012  . TOTAL KNEE ARTHROPLASTY  January 2011  . TOTAL KNEE ARTHROPLASTY  Feb. 2012  . TOTAL SHOULDER ARTHROPLASTY Right 05/23/2017   Procedure: RIGHT TOTAL SHOULDER ARTHROPLASTY;  Surgeon: Meredith Pel, MD;  Location: [facility];  Service: Orthopedics;  Laterality: Right;  Marland Kitchen VASECTOMY      There were no vitals filed for this visit.       Subjective Assessment - 09/07/17 1248    Subjective Patient reports no real changes in the "biting" pain in the bicep   Currently in Pain? No/denies   Aggravating Factors  reaching out and up with a  twist pain will "bite" in the biceps   Pain Relieving Factors no pain at rest and with most ADL's                Objective measurements completed on examination: See above findings.          [facility] Adult PT Treatment/Exercise - 09/07/17 0001      Shoulder Exercises: Standing   Other Standing Exercises overhead walk with pool noodle   Other Standing Exercises shooting green/red medicine ball like basketball 2x15 10' target, foot ball throw, (NERF)     Shoulder Exercises: ROM/Strengthening   UBE (Upper Arm Bike) constant work 35 watts x 5 minutes   Wall Pushups 20 reps   Rhythmic Stabilization, Supine standing with bilat arms in flexion 1x10 yellow ball, right UE 1x10 green ball     Modalities   Modalities Ultrasound     Moist Heat Therapy   Number Minutes Moist Heat 15 Minutes   Moist Heat Location Shoulder     Electrical Stimulation   Electrical Stimulation Location right biceps area   Electrical Stimulation Action IFC   Electrical Stimulation Parameters sitting   Electrical Stimulation Goals Pain     Ultrasound   Ultrasound Location right biceps  Ultrasound Parameters 100% 1.4 w/cm2   Ultrasound Goals Pain     Manual Therapy   Passive ROM PROM of the right shoulder ER to pain, flexion, abduction, horizontal abd. and adduction and IR for good stretch, no pain.                  PT Short Term Goals - 06/13/17 0919      PT SHORT TERM GOAL #1   Title independent with initial HEP   Status Achieved           PT Long Term Goals - 09/07/17 1254      PT LONG TERM GOAL #1   Title understand protocol and the progression   Status Achieved     PT LONG TERM GOAL #2   Title decrease pain 50%   Status Achieved     PT LONG TERM GOAL #3   Title increase AROM of right shoulder flexion to 110 degrees   Status Achieved     PT LONG TERM GOAL #4   Title report no difficulty dressing or doing hair   Status Achieved     PT LONG TERM GOAL #5    Title lift 3# to shoulder height shelf   Status Achieved     PT LONG TERM GOAL #6   Title Increase AROM in right shoulder to 135 degrees.    Status Partially Met                Plan - 09/07/17 1252    Clinical Impression Statement continues with the bite in the biceps with reaching out.  Overall the ROM and function is really good.  He is tight into ER and abduction   PT Next Visit Plan we will hold treatment about 3 weeks, he is to go on vacation and do light stretching only, when he returns he will see the MD   Consulted and Agree with Plan of Care Patient      Patient will benefit from skilled therapeutic intervention in order to improve the following deficits and impairments:  Decreased range of motion, Decreased strength, Increased edema, Postural dysfunction, Improper body mechanics, Pain, Increased muscle spasms, Impaired UE functional use  Visit Diagnosis: Acute pain of right shoulder - Plan: PT plan of care cert/re-cert  Stiffness of right shoulder, not elsewhere classified - Plan: PT plan of care cert/re-cert     Problem List Patient Active Problem List   Diagnosis Date Noted  . Shoulder arthritis 05/23/2017  . Hereditary and idiopathic peripheral neuropathy 03/24/2015  . 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 Pain of right heel 12/18/2012    Sumner Boast 09/07/2017, 12:56 PM  [place] [address] [place], [place], [address] Phone: [phone]   Fax:  [phone]  Name: SELSO MANNOR MRN: [id number] Date of Birth: 20-May-1946

## 2017-09-13 ENCOUNTER — Ambulatory Visit: Payer: Medicare Other | Admitting: Physical Therapy

## 2017-10-04 ENCOUNTER — Encounter (INDEPENDENT_AMBULATORY_CARE_PROVIDER_SITE_OTHER): Payer: Self-pay | Admitting: Orthopedic Surgery

## 2017-10-04 ENCOUNTER — Ambulatory Visit (INDEPENDENT_AMBULATORY_CARE_PROVIDER_SITE_OTHER): Payer: Medicare Other | Admitting: Orthopedic Surgery

## 2017-10-04 DIAGNOSIS — M19011 Primary osteoarthritis, right shoulder: Secondary | ICD-10-CM | POA: Diagnosis not present

## 2017-10-04 NOTE — Progress Notes (Signed)
d 

## 2017-10-08 NOTE — Progress Notes (Signed)
Office Visit Note   Patient: George Freeman           Date of Birth: [birth year]           MRN: [id number] Visit Date: 10/04/2017 Requested by: Tracey Harries, MD [address] [place], [place] [address] PCP: Tracey Harries, MD  Subjective: Chief Complaint  Patient presents with  . Right Shoulder - Follow-up    HPI: Patient is a 71 year old with right shoulder pain.  Patient states that his shoulder is doing well overall.  Reports increased range of motion.  Still has some pain in the biceps region.  Stop physical therapy 3 weeks ago.              ROS: All systems reviewed are negative as they relate to the chief complaint within the history of present illness.  Patient denies  fevers or chills.   Assessment & Plan: Visit Diagnoses:  1. Arthritis of right shoulder region     Plan: Impression is right shoulder arthritis status post shoulder replacement 4 months ago.  Patient is doing well in terms of pain relief and function.  I will see him back in 4 months for final check.  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  It is likely the source of his pain for which there is no surgical solution.  Follow-up in 4 months for final check  Follow-Up Instructions: Return in about 4 months (around 02/04/2018).   Orders:  No orders of the defined types were placed in this encounter.  No orders of the defined types were placed in this encounter.     Procedures: No procedures performed   Clinical Data: No additional findings.  Objective: Vital Signs: There were no vitals taken for this visit.  Physical Exam:   Constitutional: Patient appears well-developed HEENT:  Head: Normocephalic Eyes:EOM are normal Neck: Normal range of motion Cardiovascular: Normal rate Pulmonary/chest: Effort normal Neurologic: Patient is alert Skin: Skin is warm Psychiatric: Patient has normal mood and affect    Ortho Exam: Orthopedic  exam demonstrates forward flexion 150 external rotation at 15 abduction on the right-hand side is to 45 he has pretty good infraspinatus supraspinatus and subscapularis strength.  Motor sensory function to the hand is present and Popeye deformity is present  Specialty Comments:  No specialty comments available.  Imaging: No results found.   PMFS History: Patient Active Problem List   Diagnosis Date Noted  . Shoulder arthritis 05/23/2017  . Hereditary and idiopathic peripheral neuropathy 03/24/2015  . 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 Pain of right heel 12/18/2012   Past Medical History:  Diagnosis Date  . Anxiety   . Arthritis   . BPV (benign positional vertigo)   . Depression   . GERD (gastroesophageal reflux disease)   . Neuromuscular disorder ([facility])   . Panic disorder   . PONV (postoperative nausea and vomiting)    chills and anxiety after surgery    Family History  Problem Relation Age of Onset  . Diabetes Mother   . Alzheimer's disease Sister   . Acute myelogenous leukemia Unknown   . Neuropathy Neg Hx     Past Surgical History:  Procedure Laterality Date  . ANAL FISSURE REPAIR  1993  . APPENDECTOMY  1969  . 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 BACK SURGERY  2011   Stenosis, sciatica  .  BACK SURGERY  December 2014  . CERVICAL SPINE SURGERY  Feb. 2014  . CERVICAL SPINE SURGERY  07/23/15   fusion  . COLONOSCOPY    . KNEE SURGERY     2x- cartilage  . left foot surgery  1985  . Left shoulder surgery  1990  . Right shoulder surgery  2012  . TOTAL KNEE ARTHROPLASTY  January 2011  . TOTAL KNEE ARTHROPLASTY  Feb. 2012  . TOTAL SHOULDER ARTHROPLASTY Right 05/23/2017   Procedure: RIGHT TOTAL SHOULDER ARTHROPLASTY;  Surgeon: Cammy Copa, MD;  Location: [facility];  Service: Orthopedics;  Laterality: Right;  Marland Kitchen VASECTOMY     Social History   Occupational History  . Retired    Social History Main Topics  . Smoking  status: Never Smoker  . Smokeless tobacco: Never Used  . Alcohol use No  . Drug use: No  . Sexual activity: Not on file

## 2017-12-12 ENCOUNTER — Other Ambulatory Visit: Payer: Self-pay | Admitting: Pain Medicine

## 2017-12-12 ENCOUNTER — Ambulatory Visit
Admission: RE | Admit: 2017-12-12 | Discharge: 2017-12-12 | Disposition: A | Discharge status: Home / Self Care | Payer: Medicare Other | Source: Ambulatory Visit | Attending: Pain Medicine | Admitting: Pain Medicine

## 2017-12-12 DIAGNOSIS — M545 Low back pain, unspecified: Secondary | ICD-10-CM

## 2018-03-22 ENCOUNTER — Ambulatory Visit: Payer: Medicare Other | Attending: Family Medicine | Admitting: Physical Therapy

## 2018-03-22 ENCOUNTER — Encounter: Payer: Self-pay | Admitting: Physical Therapy

## 2018-03-22 DIAGNOSIS — R42 Dizziness and giddiness: Secondary | ICD-10-CM | POA: Diagnosis present

## 2018-03-22 DIAGNOSIS — H8113 Benign paroxysmal vertigo, bilateral: Secondary | ICD-10-CM

## 2018-03-22 NOTE — Therapy (Signed)
[facility] [place] [address] [place], [place], [address] Phone: [phone]   Fax:  [phone]  Physical Therapy Evaluation  Patient Details  Name: George Freeman MRN: [id number] Date of Birth: 01/24/1946 Referring Provider: Everlene Other   Encounter Date: 03/22/2018  PT End of Session - 03/22/18 1207    Visit Number  1    Date for PT Re-Evaluation  05/22/18    PT Start Time  1015    PT Stop Time  1100    PT Time Calculation (min)  45 min    Activity Tolerance  Patient tolerated treatment well    Behavior During Therapy  [facility] for tasks assessed/performed       Past Medical History:  Diagnosis Date  . Anxiety   . Arthritis   . BPV (benign positional vertigo)   . Depression   . GERD (gastroesophageal reflux disease)   . Neuromuscular disorder ([facility])   . Panic disorder   . PONV (postoperative nausea and vomiting)    chills and anxiety after surgery    Past Surgical History:  Procedure Laterality Date  . ANAL FISSURE REPAIR  1993  . APPENDECTOMY  1969  . 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 BACK SURGERY  2011   Stenosis, sciatica  . BACK SURGERY  December 2014  . CERVICAL SPINE SURGERY  Feb. 2014  . CERVICAL SPINE SURGERY  07/23/15   fusion  . COLONOSCOPY    . KNEE SURGERY     2x- cartilage  . left foot surgery  1985  . Left shoulder surgery  1990  . Right shoulder surgery  2012  . TOTAL KNEE ARTHROPLASTY  January 2011  . TOTAL KNEE ARTHROPLASTY  Feb. 2012  . TOTAL SHOULDER ARTHROPLASTY Right 05/23/2017   Procedure: RIGHT TOTAL SHOULDER ARTHROPLASTY;  Surgeon: Cammy Copa, MD;  Location: [facility];  Service: Orthopedics;  Laterality: Right;  Marland Kitchen VASECTOMY      There were no vitals filed for this visit.   Subjective Assessment - 03/22/18 1018    Subjective  Patient is referred for vertigo and dizziness.  He reports that he has had issues for about 50 years.  He reports that over the years he has  had the Epley with good results and learned how to do himself with good results.  He reports that the most recent episode started in January he was on a cruise, he was on land and looked up to see a bird and the episode started, he reports that he has not been able to get rid of this.    Patient Stated Goals  have less dizziness    Currently in Pain?  No/denies         [facility] PT Assessment - 03/22/18 0001      Assessment   Medical Diagnosis  Dizziness    Referring Provider  [place]    Onset Date/Surgical Date  01/22/18      Precautions   Precautions  None      Balance Screen   Has the patient fallen in the past 6 months  No    Has the patient had a decrease in activity level because of a fear of falling?   No    Is the patient reluctant to leave their home because of a fear of falling?   No      Home Environment   Additional Comments  light housework  Prior Function   Level of Independence  Independent    Vocation  Retired    Leisure  no exercise           Vestibular Assessment - 03/22/18 0001      Symptom Behavior   Type of Dizziness  "Funny feeling in head"    Frequency of Dizziness  fairly constant since January    Duration of Dizziness  10-12 x/day    Aggravating Factors  Sit to stand;Looking up to the ceiling    Relieving Factors  Closing eyes;Rest      Occulomotor Exam   Occulomotor Alignment  Normal    Smooth Pursuits  Intact    Saccades  Intact      Vestibulo-Occular Reflex   VOR Cancellation  Unable to maintain gaze      Dix-Hallpike Right   Dix-Hallpike Right Duration  lasted 15 seconds then would resolve, perfromed Epley and + right , and then rolling to the left nystagmus again    Dix-Hallpike Right Symptoms  Other (comment) had an up beat but he could not keep eyes open        No data recorded  Objective measurements completed on examination: See above findings.              PT Education - 03/22/18 1207    Education provided   Yes    Education Details  gave education about BPPV, the Epley Manuever, Brand Daroff exercises and VOR exercises, also gave him care after Epley to take it easy today and tomorrow    Person(s) Educated  Patient    Methods  Explanation;Demonstration;Handout    Comprehension  Verbalized understanding       PT Short Term Goals - 03/22/18 1213      PT SHORT TERM GOAL #1   Title  independent with initial HEP    Time  2    Period  Weeks    Status  New        PT Long Term Goals - 03/22/18 1213      PT LONG TERM GOAL #1   Title  report decrease of dizziness symptoms 50%    Time  8    Period  Weeks    Status  New      PT LONG TERM GOAL #2   Title  report no syptoms looking up > 50% of the time    Time  8    Period  Weeks    Status  New      PT LONG TERM GOAL #3   Title  patient and wife safe with home Epley     Time  8    Period  Weeks    Status  New      PT LONG TERM GOAL #4   Title  independent with advanced HEP for vestibular function    Time  8    Period  Weeks             Plan - 03/22/18 1209    Clinical Impression Statement  Patient has a long hx of vertigo, (50 yrs) he reports that he has had his wife do the Epley Maneuver on him in the past and it usually goes away, her reports this onset was in January looking up to see a bird and the dizziness has not resolved.  He reports that he has tried the maneuver wihtout success.  VOR exercises did not bother him except for eyes, head and object  moving together caused some dizziness.  Dix Halpike right was positive with a nystagmus that looked to be upbeating but I could not fully tell as he could not keep eyes open, I did right Epley symptoms would last 15 seconds and resolve for each rotation, he reported feeling bettter after the maneuver.    Clinical Presentation  Stable    Clinical Decision Making  Low    Rehab Potential  Good    PT Frequency  1x / week    PT Duration  8 weeks    PT Treatment/Interventions   Vestibular;Visual/perceptual remediation/compensation;Canalith Repostioning;Neuromuscular re-education;Therapeutic exercise;Balance training;Therapeutic activities    PT Next Visit Plan  may need to teach wife how to do Epley as it seems that they were not getting any neck extension, may need more VOR exercises and may need to start Brandt-Daroff    Consulted and Agree with Plan of Care  Patient       Patient will benefit from skilled therapeutic intervention in order to improve the following deficits and impairments:  Impaired vision/preception, Dizziness  Visit Diagnosis: Dizziness and giddiness - Plan: PT plan of care cert/re-cert  BPPV (benign paroxysmal positional vertigo), bilateral - Plan: PT plan of care cert/re-cert     Problem List Patient Active Problem List   Diagnosis Date Noted  . Shoulder arthritis 05/23/2017  . Hereditary and idiopathic peripheral neuropathy 03/24/2015  . 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 Pain of right heel 12/18/2012    Jearld Lesch., PT 03/22/2018, 12:16 PM  [facility] [place] [address] [place], [place], [address] Phone: [phone]   Fax:  [phone]  Name: NATION CRADLE MRN: [id number] Date of Birth: 1946-04-22

## 2018-03-28 ENCOUNTER — Encounter: Payer: Self-pay | Admitting: Physical Therapy

## 2018-03-28 ENCOUNTER — Ambulatory Visit: Payer: Medicare Other | Attending: Family Medicine | Admitting: Physical Therapy

## 2018-03-28 DIAGNOSIS — R42 Dizziness and giddiness: Secondary | ICD-10-CM | POA: Insufficient documentation

## 2018-03-28 DIAGNOSIS — H8113 Benign paroxysmal vertigo, bilateral: Secondary | ICD-10-CM | POA: Diagnosis present

## 2018-03-28 NOTE — Therapy (Signed)
[facility] [address] [place], [place], [address] Phone: [phone]   Fax:  [phone]  Physical Therapy Treatment  Patient Details  Name: AALIYAH CANCRO MRN: [id number] Date of Birth: 02-06-1946 Referring Provider: Everlene Other   Encounter Date: 03/28/2018  PT End of Session - 03/28/18 1141    Visit Number  2    Date for PT Re-Evaluation  05/22/18    PT Start Time  1013    PT Stop Time  1100    PT Time Calculation (min)  47 min    Activity Tolerance  Patient tolerated treatment well    Behavior During Therapy  [facility] for tasks assessed/performed       Past Medical History:  Diagnosis Date  . Anxiety   . Arthritis   . BPV (benign positional vertigo)   . Depression   . GERD (gastroesophageal reflux disease)   . Neuromuscular disorder (HCC)   . Panic disorder   . PONV (postoperative nausea and vomiting)    chills and anxiety after surgery    Past Surgical History:  Procedure Laterality Date  . ANAL FISSURE REPAIR  1993  . APPENDECTOMY  1969  . 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 BACK SURGERY  2011   Stenosis, sciatica  . BACK SURGERY  December 2014  . CERVICAL SPINE SURGERY  Feb. 2014  . CERVICAL SPINE SURGERY  07/23/15   fusion  . COLONOSCOPY    . KNEE SURGERY     2x- cartilage  . left foot surgery  1985  . Left shoulder surgery  1990  . Right shoulder surgery  2012  . TOTAL KNEE ARTHROPLASTY  January 2011  . TOTAL KNEE ARTHROPLASTY  Feb. 2012  . TOTAL SHOULDER ARTHROPLASTY Right 05/23/2017   Procedure: RIGHT TOTAL SHOULDER ARTHROPLASTY;  Surgeon: Cammy Copa, MD;  Location: [facility] OR;  Service: Orthopedics;  Laterality: Right;  Marland Kitchen VASECTOMY      There were no vitals filed for this visit.  Subjective Assessment - 03/28/18 1016    Subjective  Patient reported feeling good when he left our office the last treatment, he did call that afternoon iwth returning symptoms, reports that they  lasted about 2 hours but resolved, reports that he is actually feeling much better now.    Currently in Pain?  No/denies                       [facility] Adult PT Treatment/Exercise - 03/28/18 0001      Self-Care   Self-Care  --    Other Self-Care Comments   taught he and his wife the Epely maneuver, went over postions and symptoms and how to progress thrrough the motions      Vestibular Treatment/Exercise - 03/28/18 0001      Vestibular Treatment/Exercise   Vestibular Treatment Provided  Canalith Repositioning    Canalith Repositioning  Epley Manuever Right    Gaze Exercises  Eye/Head Exercise Horizontal;Eye/Head Exercise Vertical       EPLEY MANUEVER RIGHT   Number of Reps   1    Overall Response  Improved Symptoms      Eye/Head Exercise Horizontal   Foot Position  seated    Reps  10      Eye/Head Exercise Vertical   Foot Position  seated     Reps  10  PT Short Term Goals - 03/28/18 1144      PT SHORT TERM GOAL #1   Title  independent with initial HEP    Status  Achieved        PT Long Term Goals - 03/28/18 1144      PT LONG TERM GOAL #3   Title  patient and wife safe with home Epley     Status  Achieved      PT LONG TERM GOAL #4   Title  independent with advanced HEP for vestibular function    Status  Achieved            Plan - 03/28/18 1141    Clinical Impression Statement  Patient reports that he had a flare up after we treated him last week but lasted only a few hours and then reports that he feels like he has gotten continually better.  Had no symptoms today with Epley, taught his wife how to do and issued and went over the VOR exercises.    PT Next Visit Plan  He and wife feel more confident and report better understading, will hold treatment    Consulted and Agree with Plan of Care  Patient       Patient will benefit from skilled therapeutic intervention in order to improve the following deficits and impairments:   Impaired vision/preception, Dizziness  Visit Diagnosis: Dizziness and giddiness  BPPV (benign paroxysmal positional vertigo), bilateral     Problem List Patient Active Problem List   Diagnosis Date Noted  . Shoulder arthritis 05/23/2017  . Hereditary and idiopathic peripheral neuropathy 03/24/2015  . 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 Pain of right heel 12/18/2012    Jearld LeschALBRIGHT,Antione Obar W., PT 03/28/2018, 11:45 AM  [facility] [place] [facility] [address] [place], [place], [address] Phone: [phone]   Fax:  [phone]  Name: Tawanna Solorvin J Blakeley MRN: [id number] Date of Birth: 04/30/1946

## 2018-08-31 ENCOUNTER — Encounter (INDEPENDENT_AMBULATORY_CARE_PROVIDER_SITE_OTHER): Payer: Self-pay | Admitting: Orthopedic Surgery

## 2018-08-31 ENCOUNTER — Ambulatory Visit (INDEPENDENT_AMBULATORY_CARE_PROVIDER_SITE_OTHER): Payer: Medicare Other | Admitting: Orthopedic Surgery

## 2018-08-31 DIAGNOSIS — M65332 Trigger finger, left middle finger: Secondary | ICD-10-CM

## 2018-08-31 NOTE — Progress Notes (Signed)
Office Visit Note   Patient: George Freeman           Date of Birth: [birth year]           MRN: [id number] Visit Date: 08/31/2018 Requested by: Tracey Harries, MD [address] [place], [place] [address] PCP: Tracey Harries, MD  Subjective: Chief Complaint  Patient presents with  . trigger finger    HPI: George Freeman is a patient with bilateral index trigger finger.  Left is worse than the right.  Index finger triggering is not a diagnosis which is currently in the system.  Therefore I was required to place trigger finger middle finger as a diagnosis but that is not the correct finger and I want to make that clear for the record.  The patient describes bilateral index finger triggering.  He is right-hand dominant.  He had back surgery 3-1/2 weeks ago and that painful for him.  He is also had trigger thumb in the past which was relieved successfully with trigger thumb release.  Left index is more symptomatic than right index.  He is dropping things.  He used a cane with his back surgery.  That may have irritated both fingers.              ROS: All systems reviewed are negative as they relate to the chief complaint within the history of present illness.  Patient denies  fevers or chills.   Assessment & Plan: Visit Diagnoses:  1. Trigger finger, left middle finger     Plan: Impression is bilateral index trigger finger.  Left more symptomatic than right.  He is tender over the A1 pulley bilaterally.  Plan is topical anti-inflammatory.  He may consider surgical release because both fingers more so the left than the right are triggering daily.  I will see him back as needed  Follow-Up Instructions: Return if symptoms worsen or fail to improve.   Orders:  No orders of the defined types were placed in this encounter.  No orders of the defined types were placed in this encounter.     Procedures: No procedures performed   Clinical Data: No additional findings.  Objective: Vital  Signs: There were no vitals taken for this visit.  Physical Exam:   Constitutional: Patient appears well-developed HEENT:  Head: Normocephalic Eyes:EOM are normal Neck: Normal range of motion Cardiovascular: Normal rate Pulmonary/chest: Effort normal Neurologic: Patient is alert Skin: Skin is warm Psychiatric: Patient has normal mood and affect    Ortho Exam: Ortho exam demonstrates tenderness over the A1 pulley both left index and right index finger.  He does have general amount of arthritis in the PIP DIP and MCP joints.  Radial pulses intact.  Wrist range of motion full.  FDP FDS tendon function intact in both index fingers.  Specialty Comments:  No specialty comments available.  Imaging: No results found.   PMFS History: Patient Active Problem List   Diagnosis Date Noted  . Shoulder arthritis 05/23/2017  . Hereditary and idiopathic peripheral neuropathy 03/24/2015  . 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 Pain of right heel 12/18/2012   Past Medical History:  Diagnosis Date  . Anxiety   . Arthritis   . BPV (benign positional vertigo)   . Depression   . GERD (gastroesophageal reflux disease)   . Neuromuscular disorder (HCC)   . Panic disorder   . PONV (postoperative nausea and vomiting)    chills and anxiety after surgery  Family History  Problem Relation Age of Onset  . Diabetes Mother   . Alzheimer's disease Sister   . Acute myelogenous leukemia Unknown   . Neuropathy Neg Hx     Past Surgical History:  Procedure Laterality Date  . ANAL FISSURE REPAIR  1993  . APPENDECTOMY  1969  . 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 BACK SURGERY  2011   Stenosis, sciatica  . BACK SURGERY  December 2014  . CERVICAL SPINE SURGERY  Feb. 2014  . CERVICAL SPINE SURGERY  07/23/15   fusion  . COLONOSCOPY    . KNEE SURGERY     2x- cartilage  . left foot surgery  1985  . Left shoulder surgery  1990  . Right shoulder surgery  2012   . TOTAL KNEE ARTHROPLASTY  January 2011  . TOTAL KNEE ARTHROPLASTY  Feb. 2012  . TOTAL SHOULDER ARTHROPLASTY Right 05/23/2017   Procedure: RIGHT TOTAL SHOULDER ARTHROPLASTY;  Surgeon: Cammy Copa, MD;  [facility];  Service: Orthopedics;  Laterality: Right;  Marland Kitchen VASECTOMY     Social History   Occupational History  . Occupation: Retired  Tobacco Use  . Smoking status: Never Smoker  . Smokeless tobacco: Never Used  Substance and Sexual Activity  . Alcohol use: No    Alcohol/week: 0.0 standard drinks  . Drug use: No  . Sexual activity: Not on file

## 2018-10-17 ENCOUNTER — Ambulatory Visit (INDEPENDENT_AMBULATORY_CARE_PROVIDER_SITE_OTHER): Payer: Medicare Other | Admitting: Orthopedic Surgery

## 2018-10-17 ENCOUNTER — Encounter (INDEPENDENT_AMBULATORY_CARE_PROVIDER_SITE_OTHER): Payer: Self-pay | Admitting: Orthopedic Surgery

## 2018-10-17 DIAGNOSIS — M65331 Trigger finger, right middle finger: Secondary | ICD-10-CM

## 2018-10-17 DIAGNOSIS — M65332 Trigger finger, left middle finger: Secondary | ICD-10-CM | POA: Diagnosis not present

## 2018-10-17 MED ORDER — METHYLPREDNISOLONE ACETATE 40 MG/ML IJ SUSP
13.3300 mg | INTRAMUSCULAR | Status: AC | PRN
  Administered 2018-10-17: 13.33 mg

## 2018-10-17 MED ORDER — LIDOCAINE HCL 1 % IJ SOLN
3.0000 mL | INTRAMUSCULAR | Status: AC | PRN
  Administered 2018-10-17: 3 mL

## 2018-10-17 MED ORDER — BUPIVACAINE HCL 0.25 % IJ SOLN
0.3300 mL | INTRAMUSCULAR | Status: AC | PRN
  Administered 2018-10-17: .33 mL

## 2018-10-17 NOTE — Progress Notes (Signed)
Office Visit Note   Patient: George Freeman           Date of Birth: 1946/11/14           MRN: [id number] Visit Date: 10/17/2018 Requested by: Tracey Harries, MD [address] [place], [place] [address] PCP: Tracey Harries, MD  Subjective: Chief Complaint  Patient presents with  . Right Hand - Pain  . Left Hand - Pain    HPI: Patient presents with bilateral pain at the base of index fingers.  He has had triggering in both of these fingers before.  Had right thumb triggering which was relieved with an injection last year.  He describes right index more than left index more than right middle finger triggering.  Pain is worse in the right index.  Since I seen him he had back surgery including a fusion at L4-5 and decompression at L1-2.              ROS: All systems reviewed are negative as they relate to the chief complaint within the history of present illness.  Patient denies  fevers or chills.   Assessment & Plan: Visit Diagnoses:  1. Trigger finger, right middle finger   2. Trigger finger, left middle finger   Impression is trigger finger right index and left index.  For some reason index and small finger triggering is not listed as a diagnosis in this computer system  Plan: Impression is trigger finger right index left index and right middle.  Plan is ultrasound-guided injection between the tendon sheath and the pulley.  This is done today.  I think it is about 50% helpful in terms of assisting with the pain.  If not then he should consider surgical release.  I will see him back as needed  Follow-Up Instructions: Return if symptoms worsen or fail to improve.   Orders:  No orders of the defined types were placed in this encounter.  No orders of the defined types were placed in this encounter.     Procedures: Hand/UE Inj: R index A1 for trigger finger on 10/17/2018 12:02 PM Indications: therapeutic Details: 25 G needle, ultrasound-guided volar  approach Medications: 0.33 mL bupivacaine 0.25 %; 13.33 mg methylPREDNISolone acetate 40 MG/ML; 3 mL lidocaine 1 % Outcome: tolerated well, no immediate complications Procedure, treatment alternatives, risks and benefits explained, specific risks discussed. Consent was given by the patient. Immediately prior to procedure a time out was called to verify the correct patient, procedure, equipment, support staff and site/side marked as required. Patient was prepped and draped in the usual sterile fashion.   Hand/UE Inj: L index A1 for trigger finger on 10/17/2018 12:03 PM Indications: therapeutic Details: 25 G needle, ultrasound-guided volar approach Medications: 0.33 mL bupivacaine 0.25 %; 13.33 mg methylPREDNISolone acetate 40 MG/ML; 3 mL lidocaine 1 % Outcome: tolerated well, no immediate complications Procedure, treatment alternatives, risks and benefits explained, specific risks discussed. Consent was given by the patient. Immediately prior to procedure a time out was called to verify the correct patient, procedure, equipment, support staff and site/side marked as required. Patient was prepped and draped in the usual sterile fashion.       Clinical Data: No additional findings.  Objective: Vital Signs: There were no vitals taken for this visit.  Physical Exam:   Constitutional: Patient appears well-developed HEENT:  Head: Normocephalic Eyes:EOM are normal Neck: Normal range of motion Cardiovascular: Normal rate Pulmonary/chest: Effort normal Neurologic: Patient is alert Skin: Skin is warm  Psychiatric: Patient has normal mood and affect    Ortho Exam: Ortho exam demonstrates full active and passive range of motion of the hand and wrist.  He does have tenderness at the A1 pulley of the right index and left index.  He has more triggering on the left index but definitely more pain on the right index A1 pulley region.  Motor or sensory function to the finger is intact on the index  finger right and left hand side.  Specialty Comments:  No specialty comments available.  Imaging: No results found.   PMFS History: Patient Active Problem List   Diagnosis Date Noted  . Shoulder arthritis 05/23/2017  . Hereditary and idiopathic peripheral neuropathy 03/24/2015  . 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 Pain of right heel 12/18/2012   Past Medical History:  Diagnosis Date  . Anxiety   . Arthritis   . BPV (benign positional vertigo)   . Depression   . GERD (gastroesophageal reflux disease)   . Neuromuscular disorder (HCC)   . Panic disorder   . PONV (postoperative nausea and vomiting)    chills and anxiety after surgery    Family History  Problem Relation Age of Onset  . Diabetes Mother   . Alzheimer's disease Sister   . Acute myelogenous leukemia Unknown   . Neuropathy Neg Hx     Past Surgical History:  Procedure Laterality Date  . ANAL FISSURE REPAIR  1993  . APPENDECTOMY  1969  . 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 BACK SURGERY  2011   Stenosis, sciatica  . BACK SURGERY  December 2014  . CERVICAL SPINE SURGERY  Feb. 2014  . CERVICAL SPINE SURGERY  07/23/15   fusion  . COLONOSCOPY    . KNEE SURGERY     2x- cartilage  . left foot surgery  1985  . Left shoulder surgery  1990  . Right shoulder surgery  2012  . TOTAL KNEE ARTHROPLASTY  January 2011  . TOTAL KNEE ARTHROPLASTY  Feb. 2012  . TOTAL SHOULDER ARTHROPLASTY Right 05/23/2017   Procedure: RIGHT TOTAL SHOULDER ARTHROPLASTY;  Surgeon: Cammy Copa, MD;  Location[facility] OR;  Service: Orthopedics;  Laterality: Right;  Marland Kitchen VASECTOMY     Social History   Occupational History  . Occupation: Retired  Tobacco Use  . Smoking status: Never Smoker  . Smokeless tobacco: Never Used  Substance and Sexual Activity  . Alcohol use: No    Alcohol/week: 0.0 standard drinks  . Drug use: No  . Sexual activity: Not on file

## 2018-10-24 ENCOUNTER — Ambulatory Visit: Payer: Medicare Other | Attending: *Deleted | Admitting: Physical Therapy

## 2018-10-24 ENCOUNTER — Encounter: Payer: Self-pay | Admitting: Physical Therapy

## 2018-10-24 DIAGNOSIS — M545 Low back pain, unspecified: Secondary | ICD-10-CM

## 2018-10-24 DIAGNOSIS — M62830 Muscle spasm of back: Secondary | ICD-10-CM | POA: Diagnosis present

## 2018-10-24 NOTE — Therapy (Signed)
[facility] [place] [address] [place], [place], [address] Phone: [phone]   Fax:  [phone]  Physical Therapy Evaluation  Patient Details  Name: George Freeman MRN: [id number] Date of Birth: 11/30/1946 Referring Provider (PT): Bernita Buffy, NP   Encounter Date: 10/24/2018  PT End of Session - 10/24/18 1551    Visit Number  1    Date for PT Re-Evaluation  12/25/18    PT Start Time  1529    PT Stop Time  1616    PT Time Calculation (min)  47 min    Activity Tolerance  Patient tolerated treatment well    Behavior During Therapy  [facility] for tasks assessed/performed       Past Medical History:  Diagnosis Date  . Anxiety   . Arthritis   . BPV (benign positional vertigo)   . Depression   . GERD (gastroesophageal reflux disease)   . Neuromuscular disorder (HCC)   . Panic disorder   . PONV (postoperative nausea and vomiting)    chills and anxiety after surgery    Past Surgical History:  Procedure Laterality Date  . ANAL FISSURE REPAIR  1993  . APPENDECTOMY  1969  . 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 BACK SURGERY  2011   Stenosis, sciatica  . BACK SURGERY  December 2014  . CERVICAL SPINE SURGERY  Feb. 2014  . CERVICAL SPINE SURGERY  07/23/15   fusion  . COLONOSCOPY    . KNEE SURGERY     2x- cartilage  . left foot surgery  1985  . Left shoulder surgery  1990  . Right shoulder surgery  2012  . TOTAL KNEE ARTHROPLASTY  January 2011  . TOTAL KNEE ARTHROPLASTY  Feb. 2012  . TOTAL SHOULDER ARTHROPLASTY Right 05/23/2017   Procedure: RIGHT TOTAL SHOULDER ARTHROPLASTY;  Surgeon: Cammy Copa, MD;  Location: [facility] OR;  Service: Orthopedics;  Laterality: Right;  Marland Kitchen VASECTOMY      There were no vitals filed for this visit.   Subjective Assessment - 10/24/18 1527    Subjective  Patient underwent a L1-2 discectomy and a L4-5 fusion on 08/06/2018.  Reports that he recently saw the MD and  felt like he was doing well however he reports that his leg pain has resolved since the surgery but the LBP remains and he has difficulty standing after sitting    Limitations  Sitting;Walking;Standing    Patient Stated Goals  get up from sitting without difficulty or pain, walk better    Currently in Pain?  Yes    Pain Score  3     Pain Location  Back    Pain Orientation  Lower;Left    Pain Descriptors / Indicators  Aching;Tightness    Pain Type  Acute pain;Surgical pain    Pain Radiating Towards  denies    Pain Onset  More than a month ago    Pain Frequency  Constant    Aggravating Factors   sitting for long periods then getting up pain up to 7-8/10    Pain Relieving Factors  movements, rest pain is 0-2/10    Effect of Pain on Daily Activities  diffiuclty getting up from sitting         [facility] PT Assessment - 10/24/18 0001      Assessment   Medical Diagnosis  S/P lumbar fusion, LBP    Referring Provider (PT)  Ala Dach Repass,  NP    Onset Date/Surgical Date  08/04/18    Prior Therapy  no      Precautions   Precautions  None      Balance Screen   Has the patient fallen in the past 6 months  No    Has the patient had a decrease in activity level because of a fear of falling?   No    Is the patient reluctant to leave their home because of a fear of falling?   No      Home Environment   Additional Comments  light housework      Prior Function   Level of Independence  Independent    Vocation  Retired    Leisure  likes to travel, some walking and hiking      Posture/Postural Control   Posture Comments  some slouched sitting posture      ROM / Strength   AROM / PROM / Strength  AROM;Strength      AROM   Overall AROM Comments  Lumbar ROM decreased 75% c/o stiffness and mild pain, cervical ROM decreased 50% for rotation and decreased 75% for side bending      Strength   Overall Strength Comments  LE's 4/5 with no pain      Flexibility   Soft Tissue  Assessment /Muscle Length  yes    Hamstrings  tight SLR to 55 degrees    ITB  very tight    Piriformis  very tight on the left      Palpation   Palpation comment  very tight with mild tenderness in teh lumbar paraspinals and in the upper traps                Objective measurements completed on examination: See above findings.      [facility] Adult PT Treatment/Exercise - 10/24/18 0001      Modalities   Modalities  Moist Heat;Electrical Stimulation      Moist Heat Therapy   Number Minutes Moist Heat  15 Minutes    Moist Heat Location  Lumbar Spine      Electrical Stimulation   Electrical Stimulation Location  left lumbar area    Electrical Stimulation Action  IFC    Electrical Stimulation Parameters  sitting    Electrical Stimulation Goals  Pain               PT Short Term Goals - 10/24/18 1555      PT SHORT TERM GOAL #1   Title  independent with initial HEP    Time  2    Period  Weeks    Status  New        PT Long Term Goals - 10/24/18 1555      PT LONG TERM GOAL #1   Title  decrease pain 50%    Time  8    Period  Weeks    Status  New      PT LONG TERM GOAL #2   Title  understand proper posture and body mechanics    Time  8    Period  Weeks    Status  New      PT LONG TERM GOAL #3   Title  increase lumbar ROM 25%    Time  8    Period  Weeks    Status  New      PT LONG TERM GOAL #4   Title  get up from sitting without any difficulty  Time  8    Period  Weeks    Status  New             Plan - 10/24/18 1552    Clinical Impression Statement  Patient reports multiple cervical and lumbar surgeries.  He underwent a L1-2 discectomy and a L4-5 fusion in August.  He reports that LE sxs resolved but he is still ahving LBP and a lot of difficulty standing after sitting, c/o tightness in the lumbar area, left > right.  He has a lot of spasms    History and Personal Factors relevant to plan of care:  2 cervical surgeries, anxiety, vertigo,  and 4 lumbar surgeries    Clinical Presentation  Evolving    Clinical Decision Making  Moderate    Rehab Potential  Good    PT Frequency  2x / week    PT Duration  8 weeks    PT Treatment/Interventions  ADLs/Self Care Home Management;Cryotherapy;Electrical Stimulation;Moist Heat;Ultrasound;Therapeutic exercise;Therapeutic activities;Iontophoresis 4mg /ml Dexamethasone;Functional mobility training;Patient/family education;Manual techniques;Dry needling    PT Next Visit Plan  gave HEP, start some gentle exercises    Consulted and Agree with Plan of Care  Patient       Patient will benefit from skilled therapeutic intervention in order to improve the following deficits and impairments:  Decreased range of motion, Difficulty walking, Increased muscle spasms, Decreased activity tolerance, Pain, Improper body mechanics, Impaired flexibility, Decreased mobility, Decreased strength, Postural dysfunction  Visit Diagnosis: Acute bilateral low back pain without sciatica - Plan: PT plan of care cert/re-cert  Muscle spasm of back - Plan: PT plan of care cert/re-cert     Problem List Patient Active Problem List   Diagnosis Date Noted  . Shoulder arthritis 05/23/2017  . Hereditary and idiopathic peripheral neuropathy 03/24/2015  . 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 Pain of right heel 12/18/2012    Jearld Lesch., PT 10/24/2018, 3:58 PM  [facility] [place] [address] [place], [place], [address] Phone: [phone]   Fax:  [phone]  Name: George Freeman MRN: [id number] Date of Birth: [birth year]

## 2018-10-24 NOTE — Patient Instructions (Signed)

## 2018-10-26 ENCOUNTER — Encounter: Payer: Self-pay | Admitting: Physical Therapy

## 2018-10-26 ENCOUNTER — Ambulatory Visit: Payer: Medicare Other | Attending: *Deleted | Admitting: Physical Therapy

## 2018-10-26 DIAGNOSIS — M62830 Muscle spasm of back: Secondary | ICD-10-CM

## 2018-10-26 DIAGNOSIS — M545 Low back pain, unspecified: Secondary | ICD-10-CM

## 2018-10-26 NOTE — Therapy (Signed)
[facility] [place] [facility] [address] [place], [place], [address] Phone: [phone]   Fax:  [phone]  Physical Therapy Treatment  Patient Details  Name: TURNER BAILLIE MRN: [id number] Date of Birth: [birth year] Referring Provider (PT): Bernita Buffy, NP   Encounter Date: 10/26/2018  PT End of Session - 10/26/18 1051    Visit Number  2    Date for PT Re-Evaluation  12/25/18    PT Start Time  1010    PT Stop Time  1054    PT Time Calculation (min)  44 min    Activity Tolerance  Patient tolerated treatment well    Behavior During Therapy  [facility] for tasks assessed/performed       Past Medical History:  Diagnosis Date  . Anxiety   . Arthritis   . BPV (benign positional vertigo)   . Depression   . GERD (gastroesophageal reflux disease)   . Neuromuscular disorder (HCC)   . Panic disorder   . PONV (postoperative nausea and vomiting)    chills and anxiety after surgery    Past Surgical History:  Procedure Laterality Date  . ANAL FISSURE REPAIR  1993  . APPENDECTOMY  1969  . 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 BACK SURGERY  2011   Stenosis, sciatica  . BACK SURGERY  December 2014  . CERVICAL SPINE SURGERY  Feb. 2014  . CERVICAL SPINE SURGERY  07/23/15   fusion  . COLONOSCOPY    . KNEE SURGERY     2x- cartilage  . left foot surgery  1985  . Left shoulder surgery  1990  . Right shoulder surgery  2012  . TOTAL KNEE ARTHROPLASTY  January 2011  . TOTAL KNEE ARTHROPLASTY  Feb. 2012  . TOTAL SHOULDER ARTHROPLASTY Right 05/23/2017   Procedure: RIGHT TOTAL SHOULDER ARTHROPLASTY;  Surgeon: Cammy Copa, MD;  Location: [facility] OR;  Service: Orthopedics;  Laterality: Right;  Marland Kitchen VASECTOMY      There were no vitals filed for this visit.  Subjective Assessment - 10/26/18 1015    Subjective  Patient reports that the exercises were pretty good, he got a little sore    Currently in Pain?  Yes    Pain  Score  3     Pain Location  Back                       [facility] Adult PT Treatment/Exercise - 10/26/18 0001      Exercises   Exercises  Lumbar      Lumbar Exercises: Stretches   Passive Hamstring Stretch  3 reps;20 seconds    Piriformis Stretch  3 reps;20 seconds      Lumbar Exercises: Aerobic   Nustep  level 4 x 6 minutes      Lumbar Exercises: Machines for Strengthening   Cybex Knee Extension  10# 2x15    Cybex Knee Flexion  25# 2x15    Other Lumbar Machine Exercise  seated row 20#, lats 20# 2x10, triceps 35#, 2x15    Other Lumbar Machine Exercise  15# AR press      Lumbar Exercises: Supine   Other Supine Lumbar Exercises  feet on ball K2C, trunk rotation, small bridges and isometric abs               PT Short Term Goals - 10/24/18 1555      PT SHORT TERM GOAL #1  Title  independent with initial HEP    Time  2    Period  Weeks    Status  New        PT Long Term Goals - 10/24/18 1555      PT LONG TERM GOAL #1   Title  decrease pain 50%    Time  8    Period  Weeks    Status  New      PT LONG TERM GOAL #2   Title  understand proper posture and body mechanics    Time  8    Period  Weeks    Status  New      PT LONG TERM GOAL #3   Title  increase lumbar ROM 25%    Time  8    Period  Weeks    Status  New      PT LONG TERM GOAL #4   Title  get up from sitting without any difficulty    Time  8    Period  Weeks    Status  New            Plan - 10/26/18 1051    Clinical Impression Statement  Patient with a good transition to exercises, needs cue to activate abs.  Also cue to hold correct posture.  He is very tight in the LE's, did not have any pain after the exercises so deferred modalities    PT Next Visit Plan  continue to increase the exercise as tolerated    Consulted and Agree with Plan of Care  Patient       Patient will benefit from skilled therapeutic intervention in order to improve the following deficits and  impairments:  Decreased range of motion, Difficulty walking, Increased muscle spasms, Decreased activity tolerance, Pain, Improper body mechanics, Impaired flexibility, Decreased mobility, Decreased strength, Postural dysfunction  Visit Diagnosis: Acute bilateral low back pain without sciatica  Muscle spasm of back     Problem List Patient Active Problem List   Diagnosis Date Noted  . Shoulder arthritis 05/23/2017  . Hereditary and idiopathic peripheral neuropathy 03/24/2015  . 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 Pain of right heel 12/18/2012    Jearld Lesch., PT 10/26/2018, 10:54 AM  [facility] [address] [place], [place], [address] Phone: [phone]   Fax:  [phone]  Name: JAGGAR BENKO MRN: [id number] Date of Birth: [birth year]

## 2018-10-29 ENCOUNTER — Ambulatory Visit: Payer: Medicare Other | Admitting: Physical Therapy

## 2018-10-29 ENCOUNTER — Encounter: Payer: Self-pay | Admitting: Physical Therapy

## 2018-10-29 DIAGNOSIS — M545 Low back pain, unspecified: Secondary | ICD-10-CM

## 2018-10-29 DIAGNOSIS — M62830 Muscle spasm of back: Secondary | ICD-10-CM

## 2018-10-29 NOTE — Patient Instructions (Signed)

## 2018-10-29 NOTE — Therapy (Signed)
[facility] [place] [facility] [address] [place], [place], [address] Phone: [phone]   Fax:  [phone]  Physical Therapy Treatment  Patient Details  Name: George Freeman MRN: [id number] Date of Birth: 1946/11/10 Referring Provider (PT): Bernita Buffy, NP   Encounter Date: 10/29/2018  PT End of Session - 10/29/18 1053    Visit Number  3    Date for PT Re-Evaluation  12/25/18    PT Start Time  1009    PT Stop Time  1104    PT Time Calculation (min)  55 min    Activity Tolerance  Patient tolerated treatment well    Behavior During Therapy  [facility] for tasks assessed/performed       Past Medical History:  Diagnosis Date  . Anxiety   . Arthritis   . BPV (benign positional vertigo)   . Depression   . GERD (gastroesophageal reflux disease)   . Neuromuscular disorder (HCC)   . Panic disorder   . PONV (postoperative nausea and vomiting)    chills and anxiety after surgery    Past Surgical History:  Procedure Laterality Date  . ANAL FISSURE REPAIR  1993  . APPENDECTOMY  1969  . 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 BACK SURGERY  2011   Stenosis, sciatica  . BACK SURGERY  December 2014  . CERVICAL SPINE SURGERY  Feb. 2014  . CERVICAL SPINE SURGERY  07/23/15   fusion  . COLONOSCOPY    . KNEE SURGERY     2x- cartilage  . left foot surgery  1985  . Left shoulder surgery  1990  . Right shoulder surgery  2012  . TOTAL KNEE ARTHROPLASTY  January 2011  . TOTAL KNEE ARTHROPLASTY  Feb. 2012  . TOTAL SHOULDER ARTHROPLASTY Right 05/23/2017   Procedure: RIGHT TOTAL SHOULDER ARTHROPLASTY;  Surgeon: Cammy Copa, MD;  Location: [facility] OR;  Service: Orthopedics;  Laterality: Right;  Marland Kitchen VASECTOMY      There were no vitals filed for this visit.  Subjective Assessment - 10/29/18 1011    Subjective  Patient reports that his mms were a little sore after the last visit, he reports that he is pleased that the  surgery helped his leg pain but continues to report the same back pain as prior to surgery    Currently in Pain?  Yes    Pain Score  3     Pain Location  Back    Pain Orientation  Left;Lower    Aggravating Factors   sitting, walking                       [facility] Adult PT Treatment/Exercise - 10/29/18 0001      Exercises   Exercises  Lumbar      Lumbar Exercises: Stretches   Piriformis Stretch  3 reps;20 seconds      Lumbar Exercises: Aerobic   Nustep  level 4 x 6 minutes      Lumbar Exercises: Machines for Strengthening   Cybex Knee Extension  10# 2x15    Cybex Knee Flexion  25# 2x15    Other Lumbar Machine Exercise  seated row 20#, lats 20# 2x10, triceps 35#, 2x15    Other Lumbar Machine Exercise  15# AR press      Lumbar Exercises: Standing   Other Standing Lumbar Exercises  resisted gait all directions      Lumbar Exercises: Supine  Other Supine Lumbar Exercises  feet on ball K2C, trunk rotation, small bridges and isometric abs      Modalities   Modalities  Moist Heat;Electrical Stimulation      Moist Heat Therapy   Number Minutes Moist Heat  15 Minutes    Moist Heat Location  Lumbar Spine      Electrical Stimulation   Electrical Stimulation Location  left lumbar area    Electrical Stimulation Action  IFC    Electrical Stimulation Parameters  sitting    Electrical Stimulation Goals  Pain      Manual Therapy   Manual Therapy  Soft tissue mobilization    Soft tissue mobilization  to the left lumbar area       Trigger Point Dry Needling - 10/29/18 1052    Consent Given?  Yes    Education Handout Provided  Yes    Muscles Treated Upper Body  Quadratus Lumborum;Longissimus    Longissimus Response  Palpable increased muscle length             PT Short Term Goals - 10/29/18 1115      PT SHORT TERM GOAL #1   Title  independent with initial HEP    Status  Achieved        PT Long Term Goals - 10/24/18 1555      PT LONG TERM GOAL #1    Title  decrease pain 50%    Time  8    Period  Weeks    Status  New      PT LONG TERM GOAL #2   Title  understand proper posture and body mechanics    Time  8    Period  Weeks    Status  New      PT LONG TERM GOAL #3   Title  increase lumbar ROM 25%    Time  8    Period  Weeks    Status  New      PT LONG TERM GOAL #4   Title  get up from sitting without any difficulty    Time  8    Period  Weeks    Status  New            Plan - 10/29/18 1054    Clinical Impression Statement  Patient with some soreness in the left lumbar area.  The exercises do not seem to increase the pain in the low back.  He is tight in the left lumbar area and has tenderness    PT Next Visit Plan  continue to increase the exercise as tolerated    Consulted and Agree with Plan of Care  Patient       Patient will benefit from skilled therapeutic intervention in order to improve the following deficits and impairments:  Decreased range of motion, Difficulty walking, Increased muscle spasms, Decreased activity tolerance, Pain, Improper body mechanics, Impaired flexibility, Decreased mobility, Decreased strength, Postural dysfunction  Visit Diagnosis: Acute bilateral low back pain without sciatica  Muscle spasm of back     Problem List Patient Active Problem List   Diagnosis Date Noted  . Shoulder arthritis 05/23/2017  . Hereditary and idiopathic peripheral neuropathy 03/24/2015  . 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 Pain of right heel 12/18/2012    Jearld Lesch., PT 10/29/2018, 11:16 AM  [facility] [place] [facility] [address] [place], [place], [address] Phone: [phone]   Fax:  [phone]  Name: George Freeman MRN: [id number] Date of Birth: 12-16-1946

## 2018-10-31 ENCOUNTER — Encounter: Payer: Self-pay | Admitting: Physical Therapy

## 2018-10-31 ENCOUNTER — Ambulatory Visit: Payer: Medicare Other | Admitting: Physical Therapy

## 2018-10-31 DIAGNOSIS — M62830 Muscle spasm of back: Secondary | ICD-10-CM

## 2018-10-31 DIAGNOSIS — M545 Low back pain, unspecified: Secondary | ICD-10-CM

## 2018-10-31 NOTE — Therapy (Signed)
[facility] [place] [facility] [address] [place], [place], [address] Phone: [phone]   Fax:  [phone]  Physical Therapy Treatment  Patient Details  Name: George Freeman MRN: [id number] Date of Birth: Sep 12, 1946 Referring Provider (PT): Bernita Buffy, NP   Encounter Date: 10/31/2018  PT End of Session - 10/31/18 1050    Visit Number  4    Date for PT Re-Evaluation  12/25/18    PT Start Time  1014    PT Stop Time  1108    PT Time Calculation (min)  54 min    Activity Tolerance  Patient tolerated treatment well    Behavior During Therapy  [facility] for tasks assessed/performed       Past Medical History:  Diagnosis Date  . Anxiety   . Arthritis   . BPV (benign positional vertigo)   . Depression   . GERD (gastroesophageal reflux disease)   . Neuromuscular disorder (HCC)   . Panic disorder   . PONV (postoperative nausea and vomiting)    chills and anxiety after surgery    Past Surgical History:  Procedure Laterality Date  . ANAL FISSURE REPAIR  1993  . APPENDECTOMY  1969  . 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 BACK SURGERY  2011   Stenosis, sciatica  . BACK SURGERY  December 2014  . CERVICAL SPINE SURGERY  Feb. 2014  . CERVICAL SPINE SURGERY  07/23/15   fusion  . COLONOSCOPY    . KNEE SURGERY     2x- cartilage  . left foot surgery  1985  . Left shoulder surgery  1990  . Right shoulder surgery  2012  . TOTAL KNEE ARTHROPLASTY  January 2011  . TOTAL KNEE ARTHROPLASTY  Feb. 2012  . TOTAL SHOULDER ARTHROPLASTY Right 05/23/2017   Procedure: RIGHT TOTAL SHOULDER ARTHROPLASTY;  Surgeon: Cammy Copa, MD;  Location: [facility] OR;  Service: Orthopedics;  Laterality: Right;  Marland Kitchen VASECTOMY      There were no vitals filed for this visit.  Subjective Assessment - 10/31/18 1017    Subjective  Patient reports that he put air in all of his tires, reports that bending over increased his pain    Currently  in Pain?  Yes    Pain Score  4     Pain Location  Back    Pain Orientation  Lower;Left    Aggravating Factors   bending over                       [facility] Adult PT Treatment/Exercise - 10/31/18 0001      Therapeutic Activites    Therapeutic Activities  ADL's    ADL's  went over prop foot with brusing teeth, golfer's lift, vaccum etc  PT demo      Lumbar Exercises: Aerobic   Nustep  level 5 x 6 minutes      Lumbar Exercises: Machines for Strengthening   Cybex Knee Extension  10# 2x15    Cybex Knee Flexion  35# 2x15    Other Lumbar Machine Exercise  seated row 20#, lats 20# 2x10, triceps 35#, 2x15    Other Lumbar Machine Exercise  15# AR press, 5# straight arm core engaged shoulder extension      Moist Heat Therapy   Number Minutes Moist Heat  15 Minutes    Moist Heat Location  Lumbar Spine      Electrical Stimulation  Electrical Stimulation Location  left lumbar area    Electrical Stimulation Action  IFC    Electrical Stimulation Parameters  sitting    Electrical Stimulation Goals  Pain               PT Short Term Goals - 10/29/18 1115      PT SHORT TERM GOAL #1   Title  independent with initial HEP    Status  Achieved        PT Long Term Goals - 10/24/18 1555      PT LONG TERM GOAL #1   Title  decrease pain 50%    Time  8    Period  Weeks    Status  New      PT LONG TERM GOAL #2   Title  understand proper posture and body mechanics    Time  8    Period  Weeks    Status  New      PT LONG TERM GOAL #3   Title  increase lumbar ROM 25%    Time  8    Period  Weeks    Status  New      PT LONG TERM GOAL #4   Title  get up from sitting without any difficulty    Time  8    Period  Weeks    Status  New            Plan - 10/31/18 1050    Clinical Impression Statement  Patient reports that he has some bad habits iwth body mechanics and does so due to it not hurting while he is doing it, when over the body mechanics for normal  ADL's that currently cause some pain.  He feels like he understand them    PT Next Visit Plan  continue to work on core stability    Consulted and Agree with Plan of Care  Patient       Patient will benefit from skilled therapeutic intervention in order to improve the following deficits and impairments:  Decreased range of motion, Difficulty walking, Increased muscle spasms, Decreased activity tolerance, Pain, Improper body mechanics, Impaired flexibility, Decreased mobility, Decreased strength, Postural dysfunction  Visit Diagnosis: Acute bilateral low back pain without sciatica  Muscle spasm of back     Problem List Patient Active Problem List   Diagnosis Date Noted  . Shoulder arthritis 05/23/2017  . Hereditary and idiopathic peripheral neuropathy 03/24/2015  . 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 Pain of right heel 12/18/2012    Jearld Lesch., PT 10/31/2018, 10:52 AM  [facility] [address] [place], [place], [address] Phone: [phone]   Fax:  [phone]  Name: George Freeman MRN: [id number] Date of Birth: 09/30/1946

## 2018-11-02 ENCOUNTER — Encounter: Payer: Self-pay | Admitting: Physical Therapy

## 2018-11-02 ENCOUNTER — Ambulatory Visit: Payer: Medicare Other | Admitting: Physical Therapy

## 2018-11-02 DIAGNOSIS — M545 Low back pain, unspecified: Secondary | ICD-10-CM

## 2018-11-02 DIAGNOSIS — M62830 Muscle spasm of back: Secondary | ICD-10-CM

## 2018-11-02 NOTE — Therapy (Signed)
[facility] [address] [place], [place], [address] Phone: [phone]   Fax:  [phone]  Physical Therapy Treatment  Patient Details  Name: George Freeman MRN: [id number] Date of Birth: [birth year] Referring Provider (PT): Cori Razor, NP   Encounter Date: 11/02/2018  PT End of Session - 11/02/18 1058    Visit Number  5    Date for PT Re-Evaluation  12/25/18    PT Start Time  7793    PT Stop Time  1112    PT Time Calculation (min)  58 min    Activity Tolerance  Patient tolerated treatment well    Behavior During Therapy  [facility] for tasks assessed/performed       Past Medical History:  Diagnosis Date  . Anxiety   . Arthritis   . BPV (benign positional vertigo)   . Depression   . GERD (gastroesophageal reflux disease)   . Neuromuscular disorder ([place])   . Panic disorder   . PONV (postoperative nausea and vomiting)    chills and anxiety after surgery    Past Surgical History:  Procedure Laterality Date  . ANAL FISSURE REPAIR  1993  . APPENDECTOMY  1969  . 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 BACK SURGERY  2011   Stenosis, sciatica  . BACK SURGERY  December 2014  . CERVICAL SPINE SURGERY  Feb. 2014  . CERVICAL SPINE SURGERY  07/23/15   fusion  . COLONOSCOPY    . KNEE SURGERY     2x- cartilage  . left foot surgery  1985  . Left shoulder surgery  1990  . Right shoulder surgery  2012  . TOTAL KNEE ARTHROPLASTY  January 2011  . TOTAL KNEE ARTHROPLASTY  Feb. 2012  . TOTAL SHOULDER ARTHROPLASTY Right 05/23/2017   Procedure: RIGHT TOTAL SHOULDER ARTHROPLASTY;  Surgeon: Meredith Pel, MD;  Location: [place];  Service: Orthopedics;  Laterality: Right;  Marland Kitchen VASECTOMY      There were no vitals filed for this visit.  Subjective Assessment - 11/02/18 1017    Subjective  Patient reports that he felt good after the last treatment, reports that yesterday he walked and had some  increased pain    Currently in Pain?  Yes    Pain Score  5     Pain Location  Back    Pain Orientation  Lower;Left                       [facility] Adult PT Treatment/Exercise - 11/02/18 0001      Lumbar Exercises: Stretches   Passive Hamstring Stretch  3 reps;20 seconds    Piriformis Stretch  3 reps;20 seconds      Lumbar Exercises: Aerobic   Nustep  level 5 x 6 minutes      Lumbar Exercises: Machines for Strengthening   Cybex Knee Extension  10# 2x15    Cybex Knee Flexion  35# 2x15    Other Lumbar Machine Exercise  seated row 25#, lats 25# 2x10, triceps 35#, 2x15    Other Lumbar Machine Exercise  15# AR press, 10# straight arm core engaged shoulder extension      Lumbar Exercises: Standing   Other Standing Lumbar Exercises  hip extension, back extension      Moist Heat Therapy   Number Minutes Moist Heat  15 Minutes    Moist Heat Location  Lumbar Spine  Acupuncturist Location  left lumbar area    Electrical Stimulation Action  IFC    Electrical Stimulation Parameters  sitting    Electrical Stimulation Goals  Pain      Manual Therapy   Manual Therapy  Soft tissue mobilization    Soft tissue mobilization  to the left lumbar area               PT Short Term Goals - 10/29/18 1115      PT SHORT TERM GOAL #1   Title  independent with initial HEP    Status  Achieved        PT Long Term Goals - 11/02/18 1100      PT LONG TERM GOAL #1   Title  decrease pain 50%    Status  On-going      PT LONG TERM GOAL #2   Title  understand proper posture and body mechanics    Status  Partially Met            Plan - 11/02/18 1058    Clinical Impression Statement  Patient has some tissue int he left lumbar area that is very sore and tight.   Seems to be parapsinals and into the buttocks.      PT Next Visit Plan  May look at [facility] to the low back vs DN    Consulted and Agree with Plan of Care  Patient        Patient will benefit from skilled therapeutic intervention in order to improve the following deficits and impairments:  Decreased range of motion, Difficulty walking, Increased muscle spasms, Decreased activity tolerance, Pain, Improper body mechanics, Impaired flexibility, Decreased mobility, Decreased strength, Postural dysfunction  Visit Diagnosis: Acute bilateral low back pain without sciatica  Muscle spasm of back     Problem List Patient Active Problem List   Diagnosis Date Noted  . Shoulder arthritis 05/23/2017  . Hereditary and idiopathic peripheral neuropathy 03/24/2015  . 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 Pain of right heel 12/18/2012    Sumner Boast., PT 11/02/2018, 11:01 AM  [place] [address] [place], [place], [address] Phone: [phone]   Fax:  [phone]  Name: George Freeman MRN: [id number] Date of Birth: 1946/09/08

## 2018-11-05 ENCOUNTER — Encounter: Payer: Self-pay | Admitting: Physical Therapy

## 2018-11-05 ENCOUNTER — Ambulatory Visit: Payer: Medicare Other | Admitting: Physical Therapy

## 2018-11-05 DIAGNOSIS — M545 Low back pain, unspecified: Secondary | ICD-10-CM

## 2018-11-05 DIAGNOSIS — M62830 Muscle spasm of back: Secondary | ICD-10-CM

## 2018-11-05 NOTE — Therapy (Signed)
[address] [place], [place], [address] Phone: [phone]   Fax:  [phone]  Physical Therapy Treatment  Patient Details  Name: George Freeman MRN: [id number] Date of Birth: Nov 15, 1946 Referring Provider (PT): Cori Razor, NP   Encounter Date: 11/05/2018  PT End of Session - 11/05/18 1138    Visit Number  6    Date for PT Re-Evaluation  12/25/18    PT Start Time  4235    PT Stop Time  1114    PT Time Calculation (min)  59 min    Activity Tolerance  Patient tolerated treatment well    Behavior During Therapy  [facility] for tasks assessed/performed       Past Medical History:  Diagnosis Date  . Anxiety   . Arthritis   . BPV (benign positional vertigo)   . Depression   . GERD (gastroesophageal reflux disease)   . Neuromuscular disorder ([employer])   . Panic disorder   . PONV (postoperative nausea and vomiting)    chills and anxiety after surgery    Past Surgical History:  Procedure Laterality Date  . ANAL FISSURE REPAIR  1993  . APPENDECTOMY  1969  . 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 BACK SURGERY  2011   Stenosis, sciatica  . BACK SURGERY  December 2014  . CERVICAL SPINE SURGERY  Feb. 2014  . CERVICAL SPINE SURGERY  07/23/15   fusion  . COLONOSCOPY    . KNEE SURGERY     2x- cartilage  . left foot surgery  1985  . Left shoulder surgery  1990  . Right shoulder surgery  2012  . TOTAL KNEE ARTHROPLASTY  January 2011  . TOTAL KNEE ARTHROPLASTY  Feb. 2012  . TOTAL SHOULDER ARTHROPLASTY Right 05/23/2017   Procedure: RIGHT TOTAL SHOULDER ARTHROPLASTY;  Surgeon: Meredith Pel, MD;  Location: [place];  Service: Orthopedics;  Laterality: Right;  Marland Kitchen VASECTOMY      There were no vitals filed for this visit.  Subjective Assessment - 11/05/18 1017    Subjective  Patient reports that "I think you hit on something with those knots you worked on", reports that he feels like  that might be what needs to be worked on    Currently in Pain?  Yes    Pain Score  2     Pain Location  Back    Pain Orientation  Left;Lower                       [facility] Adult PT Treatment/Exercise - 11/05/18 0001      Lumbar Exercises: Stretches   Passive Hamstring Stretch  3 reps;20 seconds    Piriformis Stretch  3 reps;20 seconds      Lumbar Exercises: Aerobic   Nustep  level 5 x 6 minutes      Lumbar Exercises: Machines for Strengthening   Cybex Knee Extension  15# 2x15    Cybex Knee Flexion  35# 2x15    Other Lumbar Machine Exercise  seated row 35#, lats 35# 2x10, triceps 35#, 2x15    Other Lumbar Machine Exercise  15# AR press, 10# straight arm core engaged shoulder extension      Lumbar Exercises: Standing   Other Standing Lumbar Exercises  hip extension 3#, back extension, resisted gait      Lumbar Exercises: Supine   Other Supine Lumbar Exercises  feet on  ball K2C, trunk rotation, small bridges and isometric abs      Moist Heat Therapy   Number Minutes Moist Heat  15 Minutes    Moist Heat Location  Lumbar Spine      Electrical Stimulation   Electrical Stimulation Location  left lumbar area    Electrical Stimulation Action  IFC    Electrical Stimulation Parameters  sitting    Electrical Stimulation Goals  Pain      Manual Therapy   Manual Therapy  Soft tissue mobilization    Soft tissue mobilization  to the left lumbar area               PT Short Term Goals - 10/29/18 1115      PT SHORT TERM GOAL #1   Title  independent with initial HEP    Status  Achieved        PT Long Term Goals - 11/02/18 1100      PT LONG TERM GOAL #1   Title  decrease pain 50%    Status  On-going      PT LONG TERM GOAL #2   Title  understand proper posture and body mechanics    Status  Partially Met            Plan - 11/05/18 1138    Clinical Impression Statement  Patient with left low back pain, has tightness and tenderness.  He overall is  doing well, reports that he can walk > 1 mile without much difficulty.    PT Next Visit Plan  tried STM today as he felt it was better, could look at DN    Consulted and Agree with Plan of Care  Patient       Patient will benefit from skilled therapeutic intervention in order to improve the following deficits and impairments:  Decreased range of motion, Difficulty walking, Increased muscle spasms, Decreased activity tolerance, Pain, Improper body mechanics, Impaired flexibility, Decreased mobility, Decreased strength, Postural dysfunction  Visit Diagnosis: Acute bilateral low back pain without sciatica  Muscle spasm of back     Problem List Patient Active Problem List   Diagnosis Date Noted  . Shoulder arthritis 05/23/2017  . Hereditary and idiopathic peripheral neuropathy 03/24/2015  . 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 Pain of right heel 12/18/2012    Sumner Boast., PT 11/05/2018, 12:58 PM  [address] [place], [place], [address] Phone: [phone]   Fax:  [phone]  Name: DODD SCHMID MRN: [id number] Date of Birth: 07-04-1946

## 2018-11-07 ENCOUNTER — Ambulatory Visit: Payer: Medicare Other | Admitting: Physical Therapy

## 2018-11-07 ENCOUNTER — Encounter: Payer: Self-pay | Admitting: Physical Therapy

## 2018-11-07 DIAGNOSIS — M545 Low back pain, unspecified: Secondary | ICD-10-CM

## 2018-11-07 DIAGNOSIS — M62830 Muscle spasm of back: Secondary | ICD-10-CM

## 2018-11-07 NOTE — Therapy (Signed)
[address] [place], [place], [address] Phone: [phone]   Fax:  [phone]  Physical Therapy Treatment  Patient Details  Name: George Freeman MRN: [id number] Date of Birth: December 17, 1946 Referring Provider (PT): Cori Razor, NP   Encounter Date: 11/07/2018  PT End of Session - 11/07/18 1139    Visit Number  7    Date for PT Re-Evaluation  12/25/18    PT Start Time  1013    PT Stop Time  1110    PT Time Calculation (min)  57 min    Activity Tolerance  Patient tolerated treatment well    Behavior During Therapy  [facility] for tasks assessed/performed       Past Medical History:  Diagnosis Date  . Anxiety   . Arthritis   . BPV (benign positional vertigo)   . Depression   . GERD (gastroesophageal reflux disease)   . Neuromuscular disorder ([place])   . Panic disorder   . PONV (postoperative nausea and vomiting)    chills and anxiety after surgery    Past Surgical History:  Procedure Laterality Date  . ANAL FISSURE REPAIR  1993  . APPENDECTOMY  1969  . 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 BACK SURGERY  2011   Stenosis, sciatica  . BACK SURGERY  December 2014  . CERVICAL SPINE SURGERY  Feb. 2014  . CERVICAL SPINE SURGERY  07/23/15   fusion  . COLONOSCOPY    . KNEE SURGERY     2x- cartilage  . left foot surgery  1985  . Left shoulder surgery  1990  . Right shoulder surgery  2012  . TOTAL KNEE ARTHROPLASTY  January 2011  . TOTAL KNEE ARTHROPLASTY  Feb. 2012  . TOTAL SHOULDER ARTHROPLASTY Right 05/23/2017   Procedure: RIGHT TOTAL SHOULDER ARTHROPLASTY;  Surgeon: Meredith Pel, MD;  Location: [facility];  Service: Orthopedics;  Laterality: Right;  Marland Kitchen VASECTOMY      There were no vitals filed for this visit.  Subjective Assessment - 11/07/18 1010    Subjective  This is really doing well.  Reports that the back is feeling better, he does have abdominal pain after sitting  or lying down and it happens when trying to get up    Currently in Pain?  Yes    Pain Score  2     Pain Location  Abdomen    Pain Orientation  Right;Left    Aggravating Factors   getting up after lying down or sitting                       [facility] Adult PT Treatment/Exercise - 11/07/18 0001      Exercises   Exercises  Lumbar      Lumbar Exercises: Stretches   Other Lumbar Stretch Exercise  worked on kneeling hip flexor stretch and edge of bed hanigng leg off hip flexor stretch      Lumbar Exercises: Aerobic   Nustep  level 5 x 6 minutes      Lumbar Exercises: Machines for Strengthening   Cybex Knee Extension  15# 2x15    Cybex Knee Flexion  35# 2x15    Leg Press  40# 2x10    Other Lumbar Machine Exercise  seated row 35#, lats 35# 2x10, triceps 35#, 2x15    Other Lumbar Machine Exercise  15# AR press, 10# straight arm core engaged shoulder  extension, 5# hip extension and abduction, mild discomfort in the abdoment with the abduction      Lumbar Exercises: Supine   Other Supine Lumbar Exercises  feet on ball K2C, trunk rotation, small bridges and isometric abs      Moist Heat Therapy   Number Minutes Moist Heat  15 Minutes    Moist Heat Location  Lumbar Spine      Electrical Stimulation   Electrical Stimulation Location  left lumbar area    Electrical Stimulation Action  IFC    Electrical Stimulation Parameters  sitting pa      Manual Therapy   Manual Therapy  Soft tissue mobilization    Soft tissue mobilization  to the left lumbar area               PT Short Term Goals - 10/29/18 1115      PT SHORT TERM GOAL #1   Title  independent with initial HEP    Status  Achieved        PT Long Term Goals - 11/07/18 1141      PT LONG TERM GOAL #1   Title  decrease pain 50%    Status  Partially Met      PT LONG TERM GOAL #2   Title  understand proper posture and body mechanics    Status  Partially Met      PT LONG TERM GOAL #3   Title  increase  lumbar ROM 25%    Status  Partially Met      PT LONG TERM GOAL #4   Title  get up from sitting without any difficulty    Status  Partially Met            Plan - 11/07/18 1140    Clinical Impression Statement  Patient really feels like the STM has helped, less pain overall and able to walk without pain, he does have some abdominal pain with certain motions, appears to have very tight hip flexors that are causing this    PT Next Visit Plan  he will be in a car>16 hours over the next 6 days see how he is when he returns    Consulted and Agree with Plan of Care  Patient       Patient will benefit from skilled therapeutic intervention in order to improve the following deficits and impairments:  Decreased range of motion, Difficulty walking, Increased muscle spasms, Decreased activity tolerance, Pain, Improper body mechanics, Impaired flexibility, Decreased mobility, Decreased strength, Postural dysfunction  Visit Diagnosis: Acute bilateral low back pain without sciatica  Muscle spasm of back     Problem List Patient Active Problem List   Diagnosis Date Noted  . Shoulder arthritis 05/23/2017  . Hereditary and idiopathic peripheral neuropathy 03/24/2015  . 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 Pain of right heel 12/18/2012    Sumner Boast., PT 11/07/2018, 11:42 AM  [place] [address] [place], [place], [address] Phone: [phone]   Fax:  [phone]  Name: George Freeman MRN: [id number] Date of Birth: Sep 11, 1946

## 2018-11-09 ENCOUNTER — Encounter: Payer: Medicare Other | Admitting: Physical Therapy

## 2018-11-12 ENCOUNTER — Ambulatory Visit: Payer: Medicare Other | Admitting: Physical Therapy

## 2018-11-14 ENCOUNTER — Encounter: Payer: Self-pay | Admitting: Physical Therapy

## 2018-11-14 ENCOUNTER — Ambulatory Visit: Payer: Medicare Other | Admitting: Physical Therapy

## 2018-11-14 DIAGNOSIS — M545 Low back pain, unspecified: Secondary | ICD-10-CM

## 2018-11-14 DIAGNOSIS — M62830 Muscle spasm of back: Secondary | ICD-10-CM

## 2018-11-14 NOTE — Therapy (Signed)
[address] [place], [place], [address] Phone: [phone]   Fax:  [phone]  Physical Therapy Treatment  Patient Details  Name: George Freeman MRN: [id number] Date of Birth: 06/02/1946 Referring Provider (PT): Cori Razor, NP   Encounter Date: 11/14/2018  PT End of Session - 11/14/18 1243    Visit Number  8    Date for PT Re-Evaluation  12/25/18    PT Start Time  1012    PT Stop Time  1110    PT Time Calculation (min)  58 min    Activity Tolerance  Patient tolerated treatment well    Behavior During Therapy  [facility] for tasks assessed/performed       Past Medical History:  Diagnosis Date  . Anxiety   . Arthritis   . BPV (benign positional vertigo)   . Depression   . GERD (gastroesophageal reflux disease)   . Neuromuscular disorder ([place])   . Panic disorder   . PONV (postoperative nausea and vomiting)    chills and anxiety after surgery    Past Surgical History:  Procedure Laterality Date  . ANAL FISSURE REPAIR  1993  . APPENDECTOMY  1969  . 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 BACK SURGERY  2011   Stenosis, sciatica  . BACK SURGERY  December 2014  . CERVICAL SPINE SURGERY  Feb. 2014  . CERVICAL SPINE SURGERY  07/23/15   fusion  . COLONOSCOPY    . KNEE SURGERY     2x- cartilage  . left foot surgery  1985  . Left shoulder surgery  1990  . Right shoulder surgery  2012  . TOTAL KNEE ARTHROPLASTY  January 2011  . TOTAL KNEE ARTHROPLASTY  Feb. 2012  . TOTAL SHOULDER ARTHROPLASTY Right 05/23/2017   Procedure: RIGHT TOTAL SHOULDER ARTHROPLASTY;  Surgeon: Meredith Pel, MD;  Location: [facility];  Service: Orthopedics;  Laterality: Right;  Marland Kitchen VASECTOMY      There were no vitals filed for this visit.  Subjective Assessment - 11/14/18 1014    Subjective  Patient was out of town the past week.  he reports overall doing well.  Reports that he was unable to do much  exercise    Currently in Pain?  Yes    Pain Score  3     Pain Location  Abdomen    Pain Orientation  Right;Left    Pain Descriptors / Indicators  Tightness    Aggravating Factors   when he goes to get up after lying down or sitting  for a longer period                       [facility] Adult PT Treatment/Exercise - 11/14/18 0001      Lumbar Exercises: Stretches   Passive Hamstring Stretch  3 reps;20 seconds    Piriformis Stretch  3 reps;20 seconds    Other Lumbar Stretch Exercise  passive quad and hip flexor stretch with him supine hanign leg off the table      Lumbar Exercises: Aerobic   Nustep  level 5 x 6 minutes      Lumbar Exercises: Machines for Strengthening   Cybex Knee Extension  15# 2x15    Cybex Knee Flexion  35# 2x15    Leg Press  40# 2x15    Other Lumbar Machine Exercise  seated row 35#, lats 35# 2x10, triceps 35#, 2x15  Other Lumbar Machine Exercise  15# AR press, 10# straight arm core engaged shoulder extension, 5# hip extension and abduction, mild discomfort in the abdoment with the abduction      Lumbar Exercises: Standing   Other Standing Lumbar Exercises  hip extension hip abduction 3#      Lumbar Exercises: Supine   Other Supine Lumbar Exercises  feet on ball K2C, trunk rotation, small bridges and isometric abs      Moist Heat Therapy   Number Minutes Moist Heat  15 Minutes    Moist Heat Location  Lumbar Spine      Electrical Stimulation   Electrical Stimulation Location  left lumbar area    Electrical Stimulation Action  IFC    Electrical Stimulation Parameters  sitting    Electrical Stimulation Goals  Pain               PT Short Term Goals - 10/29/18 1115      PT SHORT TERM GOAL #1   Title  independent with initial HEP    Status  Achieved        PT Long Term Goals - 11/14/18 1245      PT LONG TERM GOAL #1   Title  decrease pain 50%    Status  Partially Met      PT LONG TERM GOAL #2   Title  understand proper posture  and body mechanics    Status  Partially Met      PT LONG TERM GOAL #3   Title  increase lumbar ROM 25%    Status  Partially Met      PT LONG TERM GOAL #4   Title  get up from sitting without any difficulty    Status  Partially Met            Plan - 11/14/18 1244    Clinical Impression Statement  Patient out of town the past week, reports htat he did not get to exercise.  He reports that he still has some difficulty getting up from sitting and up from supine.  He is very tight in the LE mms especially the quads and hip flexors    PT Next Visit Plan  may return to the [facility] as he reported this helped a  lot    Consulted and Agree with Plan of Care  Patient       Patient will benefit from skilled therapeutic intervention in order to improve the following deficits and impairments:  Decreased range of motion, Difficulty walking, Increased muscle spasms, Decreased activity tolerance, Pain, Improper body mechanics, Impaired flexibility, Decreased mobility, Decreased strength, Postural dysfunction  Visit Diagnosis: Acute bilateral low back pain without sciatica  Muscle spasm of back     Problem List Patient Active Problem List   Diagnosis Date Noted  . Shoulder arthritis 05/23/2017  . Hereditary and idiopathic peripheral neuropathy 03/24/2015  . 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 Pain of right heel 12/18/2012    Sumner Boast., PT 11/14/2018, 12:46 PM  [address] [address] [place], [place], [address] Phone: [phone]   Fax:  [phone]  Name: ROHAAN DURNIL MRN: [id number] Date of Birth: Apr 27, 1946

## 2018-11-16 ENCOUNTER — Ambulatory Visit: Payer: Medicare Other | Admitting: Physical Therapy

## 2018-11-16 ENCOUNTER — Encounter: Payer: Self-pay | Admitting: Physical Therapy

## 2018-11-16 DIAGNOSIS — M545 Low back pain, unspecified: Secondary | ICD-10-CM

## 2018-11-16 DIAGNOSIS — M62830 Muscle spasm of back: Secondary | ICD-10-CM

## 2018-11-16 NOTE — Therapy (Signed)
[address] [place], [place], [address] Phone: [phone]   Fax:  [phone]  Physical Therapy Treatment  Patient Details  Name: George Freeman MRN: [id number] Date of Birth: [birth year] Referring Provider (PT): Cori Razor, NP   Encounter Date: 11/16/2018  PT End of Session - 11/16/18 1101    Visit Number  9    Date for PT Re-Evaluation  12/25/18    PT Start Time  2992    PT Stop Time  1110    PT Time Calculation (min)  56 min    Activity Tolerance  Patient tolerated treatment well    Behavior During Therapy  [facility] for tasks assessed/performed       Past Medical History:  Diagnosis Date  . Anxiety   . Arthritis   . BPV (benign positional vertigo)   . Depression   . GERD (gastroesophageal reflux disease)   . Neuromuscular disorder ([place])   . Panic disorder   . PONV (postoperative nausea and vomiting)    chills and anxiety after surgery    Past Surgical History:  Procedure Laterality Date  . ANAL FISSURE REPAIR  1993  . APPENDECTOMY  1969  . 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 BACK SURGERY  2011   Stenosis, sciatica  . BACK SURGERY  December 2014  . CERVICAL SPINE SURGERY  Feb. 2014  . CERVICAL SPINE SURGERY  07/23/15   fusion  . COLONOSCOPY    . KNEE SURGERY     2x- cartilage  . left foot surgery  1985  . Left shoulder surgery  1990  . Right shoulder surgery  2012  . TOTAL KNEE ARTHROPLASTY  January 2011  . TOTAL KNEE ARTHROPLASTY  Feb. 2012  . TOTAL SHOULDER ARTHROPLASTY Right 05/23/2017   Procedure: RIGHT TOTAL SHOULDER ARTHROPLASTY;  Surgeon: Meredith Pel, MD;  Location: [facility];  Service: Orthopedics;  Laterality: Right;  Marland Kitchen VASECTOMY      There were no vitals filed for this visit.  Subjective Assessment - 11/16/18 1058    Subjective  Patietn reports that he is feeling better feels like the STM to the knots is what really helps    Currently in  Pain?  Yes    Pain Score  3     Pain Location  Back    Pain Orientation  Left;Lower    Pain Descriptors / Indicators  Tightness    Aggravating Factors   longer walks    Pain Relieving Factors  the STM                       [facility] Adult PT Treatment/Exercise - 11/16/18 0001      Lumbar Exercises: Stretches   Passive Hamstring Stretch  3 reps;20 seconds    Piriformis Stretch  3 reps;20 seconds    Other Lumbar Stretch Exercise  passive quad and hip flexor stretch with him supine hanign leg off the table      Lumbar Exercises: Machines for Strengthening   Other Lumbar Machine Exercise  seated row 35#, lats 35# 2x10, triceps 35#, 2x15    Other Lumbar Machine Exercise  15# AR press, 10# straight arm core engaged shoulder extension, 5# hip extension and abduction, mild discomfort in the abdoment with the abduction      Lumbar Exercises: Standing   Other Standing Lumbar Exercises  hip extension hip abduction 5# on the pulley  system      Moist Heat Therapy   Number Minutes Moist Heat  15 Minutes    Moist Heat Location  Lumbar Spine      Electrical Stimulation   Electrical Stimulation Location  left lumbar area    Electrical Stimulation Action  IFC    Electrical Stimulation Parameters  sitting    Electrical Stimulation Goals  Pain      Manual Therapy   Manual Therapy  Soft tissue mobilization    Soft tissue mobilization  to the left lumbar area               PT Short Term Goals - 10/29/18 1115      PT SHORT TERM GOAL #1   Title  independent with initial HEP    Status  Achieved        PT Long Term Goals - 11/14/18 1245      PT LONG TERM GOAL #1   Title  decrease pain 50%    Status  Partially Met      PT LONG TERM GOAL #2   Title  understand proper posture and body mechanics    Status  Partially Met      PT LONG TERM GOAL #3   Title  increase lumbar ROM 25%    Status  Partially Met      PT LONG TERM GOAL #4   Title  get up from sitting without  any difficulty    Status  Partially Met            Plan - 11/16/18 1101    Clinical Impression Statement  He is very tender with the left SI and lumbar area, has knots present. He is able to walk longer distances.  Still tight in the quad area    PT Next Visit Plan  may return to the [facility] as he reported this helped a  lot    Consulted and Agree with Plan of Care  Patient       Patient will benefit from skilled therapeutic intervention in order to improve the following deficits and impairments:  Decreased range of motion, Difficulty walking, Increased muscle spasms, Decreased activity tolerance, Pain, Improper body mechanics, Impaired flexibility, Decreased mobility, Decreased strength, Postural dysfunction  Visit Diagnosis: Acute bilateral low back pain without sciatica  Muscle spasm of back     Problem List Patient Active Problem List   Diagnosis Date Noted  . Shoulder arthritis 05/23/2017  . Hereditary and idiopathic peripheral neuropathy 03/24/2015  . 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 Pain of right heel 12/18/2012    Sumner Boast., PT 11/16/2018, 11:03 AM  [place], [place], [address] Phone: [phone]   Fax:  [phone]  Name: NYKOLAS BACALLAO MRN: [id number] Date of Birth: 1946-08-31

## 2018-11-19 ENCOUNTER — Encounter: Payer: Self-pay | Admitting: Physical Therapy

## 2018-11-19 ENCOUNTER — Ambulatory Visit: Payer: Medicare Other | Admitting: Physical Therapy

## 2018-11-19 DIAGNOSIS — M62830 Muscle spasm of back: Secondary | ICD-10-CM

## 2018-11-19 DIAGNOSIS — M545 Low back pain, unspecified: Secondary | ICD-10-CM

## 2018-11-19 NOTE — Therapy (Signed)
[address] [place], [place], [address] Phone: [phone]   Fax:  [phone] Progress Note Reporting Period 10/24/18 to 11/19/18 for the first 10 visits  See note below for Objective Data and Assessment of Progress/Goals.      Physical Therapy Treatment  Patient Details  Name: George Freeman MRN: [id number] Date of Birth: 27-Sep-1946 Referring Provider (PT): Cori Razor, NP   Encounter Date: 11/19/2018  PT End of Session - 11/19/18 1004    Visit Number  10    Date for PT Re-Evaluation  12/25/18    PT Start Time  0915    PT Stop Time  1019    PT Time Calculation (min)  64 min    Activity Tolerance  Patient tolerated treatment well    Behavior During Therapy  [facility] for tasks assessed/performed       Past Medical History:  Diagnosis Date  . Anxiety   . Arthritis   . BPV (benign positional vertigo)   . Depression   . GERD (gastroesophageal reflux disease)   . Neuromuscular disorder ([place])   . Panic disorder   . PONV (postoperative nausea and vomiting)    chills and anxiety after surgery    Past Surgical History:  Procedure Laterality Date  . ANAL FISSURE REPAIR  1993  . APPENDECTOMY  1969  . 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 BACK SURGERY  2011   Stenosis, sciatica  . BACK SURGERY  December 2014  . CERVICAL SPINE SURGERY  Feb. 2014  . CERVICAL SPINE SURGERY  07/23/15   fusion  . COLONOSCOPY    . KNEE SURGERY     2x- cartilage  . left foot surgery  1985  . Left shoulder surgery  1990  . Right shoulder surgery  2012  . TOTAL KNEE ARTHROPLASTY  January 2011  . TOTAL KNEE ARTHROPLASTY  Feb. 2012  . TOTAL SHOULDER ARTHROPLASTY Right 05/23/2017   Procedure: RIGHT TOTAL SHOULDER ARTHROPLASTY;  Surgeon: Meredith Pel, MD;  Location: [place];  Service: Orthopedics;  Laterality: Right;  Marland Kitchen VASECTOMY      There were no vitals filed for this  visit.  Subjective Assessment - 11/19/18 5997    Subjective  Patient reports that he has been walking daily, tends to get pain in the low back up to 5/10 at the end of the walks    Currently in Pain?  Yes    Pain Score  3     Pain Location  Back    Pain Orientation  Left;Lower    Aggravating Factors   long walks    Pain Relieving Factors  STM                       [facility] Adult PT Treatment/Exercise - 11/19/18 0001      Lumbar Exercises: Stretches   Passive Hamstring Stretch  3 reps;20 seconds    Piriformis Stretch  3 reps;20 seconds    Other Lumbar Stretch Exercise  passive quad and hip flexor stretch with him supine hanign leg off the table      Lumbar Exercises: Machines for Strengthening   Cybex Knee Extension  15# 2x15    Cybex Knee Flexion  35# 2x15    Leg Press  40# 2x15    Other Lumbar Machine Exercise  seated row 35#, lats 35# 2x10, triceps 35#, 2x15    Other  Lumbar Machine Exercise  15# AR press, 10# straight arm core engaged shoulder extension, 5# hip extension and abduction, mild discomfort in the abdoment with the abduction, 10# hip flexion on the pulley      Lumbar Exercises: Supine   Other Supine Lumbar Exercises  feet on ball K2C, trunk rotation, small bridges and isometric abs      Moist Heat Therapy   Number Minutes Moist Heat  15 Minutes    Moist Heat Location  Lumbar Spine      Electrical Stimulation   Electrical Stimulation Location  left lumbar area    Electrical Stimulation Action  IFC    Electrical Stimulation Parameters  sitting    Electrical Stimulation Goals  Pain      Manual Therapy   Manual Therapy  Soft tissue mobilization    Soft tissue mobilization  to the left lumbar area               PT Short Term Goals - 10/29/18 1115      PT SHORT TERM GOAL #1   Title  independent with initial HEP    Status  Achieved        PT Long Term Goals - 11/19/18 1006      PT LONG TERM GOAL #1   Title  decrease pain 50%     Status  Partially Met      PT LONG TERM GOAL #2   Title  understand proper posture and body mechanics    Status  Achieved      PT LONG TERM GOAL #3   Title  increase lumbar ROM 25%    Status  Partially Met      PT LONG TERM GOAL #4   Title  get up from sitting without any difficulty    Status  Partially Met            Plan - 11/19/18 1005    Clinical Impression Statement  Patient is doeing well, tolerating walking, however he still c/o some tightness and pain in the left low back with the end of walking, he also has pain with a bridge    PT Next Visit Plan  continue to work on strength and flexibility    Consulted and Agree with Plan of Care  Patient       Patient will benefit from skilled therapeutic intervention in order to improve the following deficits and impairments:  Decreased range of motion, Difficulty walking, Increased muscle spasms, Decreased activity tolerance, Pain, Improper body mechanics, Impaired flexibility, Decreased mobility, Decreased strength, Postural dysfunction  Visit Diagnosis: Acute bilateral low back pain without sciatica  Muscle spasm of back     Problem List Patient Active Problem List   Diagnosis Date Noted  . Shoulder arthritis 05/23/2017  . Hereditary and idiopathic peripheral neuropathy 03/24/2015  . 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 Pain of right heel 12/18/2012    Sumner Boast., PT 11/19/2018, 10:07 AM  [facility] [place], [place], [address] Phone: [phone]   Fax:  [phone]  Name: George Freeman MRN: [id number] Date of Birth: July 15, 1946

## 2018-11-20 ENCOUNTER — Ambulatory Visit: Payer: Medicare Other | Admitting: Physical Therapy

## 2018-11-21 ENCOUNTER — Ambulatory Visit: Payer: Medicare Other | Admitting: Physical Therapy

## 2018-11-21 ENCOUNTER — Encounter: Payer: Self-pay | Admitting: Physical Therapy

## 2018-11-21 DIAGNOSIS — M545 Low back pain, unspecified: Secondary | ICD-10-CM

## 2018-11-21 DIAGNOSIS — M62830 Muscle spasm of back: Secondary | ICD-10-CM

## 2018-11-21 NOTE — Therapy (Signed)
[address] [place], [place], [address] Phone: [phone]   Fax:  [phone]  Physical Therapy Treatment  Patient Details  Name: George Freeman MRN: [id number] Date of Birth: [birth year] Referring Provider (PT): Cori Razor, NP   Encounter Date: 11/21/2018  PT End of Session - 11/21/18 1102    Visit Number  11    Date for PT Re-Evaluation  12/25/18    PT Start Time  1010    PT Stop Time  1115    PT Time Calculation (min)  65 min    Activity Tolerance  Patient tolerated treatment well    Behavior During Therapy  [facility] for tasks assessed/performed       Past Medical History:  Diagnosis Date  . Anxiety   . Arthritis   . BPV (benign positional vertigo)   . Depression   . GERD (gastroesophageal reflux disease)   . Neuromuscular disorder ([place])   . Panic disorder   . PONV (postoperative nausea and vomiting)    chills and anxiety after surgery    Past Surgical History:  Procedure Laterality Date  . ANAL FISSURE REPAIR  1993  . APPENDECTOMY  1969  . 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 BACK SURGERY  2011   Stenosis, sciatica  . BACK SURGERY  December 2014  . CERVICAL SPINE SURGERY  Feb. 2014  . CERVICAL SPINE SURGERY  07/23/15   fusion  . COLONOSCOPY    . KNEE SURGERY     2x- cartilage  . left foot surgery  1985  . Left shoulder surgery  1990  . Right shoulder surgery  2012  . TOTAL KNEE ARTHROPLASTY  January 2011  . TOTAL KNEE ARTHROPLASTY  Feb. 2012  . TOTAL SHOULDER ARTHROPLASTY Right 05/23/2017   Procedure: RIGHT TOTAL SHOULDER ARTHROPLASTY;  Surgeon: Meredith Pel, MD;  Location: [place];  Service: Orthopedics;  Laterality: Right;  Marland Kitchen VASECTOMY      There were no vitals filed for this visit.  Subjective Assessment - 11/21/18 1011    Subjective  Patient reports that he is doing well. Reports some soreness from last session     Currently in Pain?   No/denies                       [facility] Adult PT Treatment/Exercise - 11/21/18 0001      Lumbar Exercises: Stretches   Passive Hamstring Stretch  3 reps;20 seconds    Piriformis Stretch  3 reps;20 seconds      Lumbar Exercises: Aerobic   Nustep  level 5 x 6 minutes      Lumbar Exercises: Machines for Strengthening   Cybex Knee Extension  15# 2x15    Cybex Knee Flexion  35# 2x15    Leg Press  40# 2x15    Other Lumbar Machine Exercise  seated row 35#, lats 35# 2x10, triceps 35#, 2x15    Other Lumbar Machine Exercise  35lb straight arm pull downs 2x15, 15lb AR press x10      Lumbar Exercises: Standing   Other Standing Lumbar Exercises  hip extension hip abduction 5# on the pulley system      Lumbar Exercises: Supine   Other Supine Lumbar Exercises  feet on ball K2C, trunk rotation, small bridges and isometric abs      Moist Heat Therapy   Number Minutes Moist Heat  15 Minutes  Moist Heat Location  Lumbar Spine      Electrical Stimulation   Electrical Stimulation Location  left lumbar area    Electrical Stimulation Action  IFC    Electrical Stimulation Parameters  sitting    Electrical Stimulation Goals  Pain               PT Short Term Goals - 10/29/18 1115      PT SHORT TERM GOAL #1   Title  independent with initial HEP    Status  Achieved        PT Long Term Goals - 11/19/18 1006      PT LONG TERM GOAL #1   Title  decrease pain 50%    Status  Partially Met      PT LONG TERM GOAL #2   Title  understand proper posture and body mechanics    Status  Achieved      PT LONG TERM GOAL #3   Title  increase lumbar ROM 25%    Status  Partially Met      PT LONG TERM GOAL #4   Title  get up from sitting without any difficulty    Status  Partially Met            Plan - 11/21/18 1103    Clinical Impression Statement  Pt did well overall wish today's activates. He did not walk to therapy due to the weather. Reports some soreness after  last session so some interventions were avoided. No reports of pain throughout. Some bi lat HS tightness noted with passive stretching. L piriformis was very tight. Postural cues needed for straight arm pull downs and hip extensions.     Rehab Potential  Good    PT Frequency  2x / week    PT Duration  8 weeks    PT Treatment/Interventions  ADLs/Self Care Home Management;Cryotherapy;Electrical Stimulation;Moist Heat;Ultrasound;Therapeutic exercise;Therapeutic activities;Iontophoresis 32m/ml Dexamethasone;Functional mobility training;Patient/family education;Manual techniques;Dry needling    PT Next Visit Plan  continue to work on strength and flexibility       Patient will benefit from skilled therapeutic intervention in order to improve the following deficits and impairments:  Decreased range of motion, Difficulty walking, Increased muscle spasms, Decreased activity tolerance, Pain, Improper body mechanics, Impaired flexibility, Decreased mobility, Decreased strength, Postural dysfunction  Visit Diagnosis: Acute bilateral low back pain without sciatica  Muscle spasm of back     Problem List Patient Active Problem List   Diagnosis Date Noted  . Shoulder arthritis 05/23/2017  . Hereditary and idiopathic peripheral neuropathy 03/24/2015  . 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 Pain of right heel 12/18/2012    RScot Jun PTA 11/21/2018, 11:06 AM  [place] [address]Phone: [phone]  Fax:  [phone] Name: George STELLYMRN: [id number] of Birth: 2Aug 31, 1947

## 2018-11-26 ENCOUNTER — Ambulatory Visit: Payer: Medicare Other | Attending: *Deleted | Admitting: Physical Therapy

## 2018-11-26 DIAGNOSIS — M542 Cervicalgia: Secondary | ICD-10-CM | POA: Insufficient documentation

## 2018-11-26 DIAGNOSIS — M62830 Muscle spasm of back: Secondary | ICD-10-CM | POA: Diagnosis present

## 2018-11-26 DIAGNOSIS — M545 Low back pain, unspecified: Secondary | ICD-10-CM

## 2018-11-26 NOTE — Therapy (Signed)
[facility] [place] [address] [place], [place], [address] Phone: [phone]   Fax:  [phone]  Physical Therapy Treatment  Patient Details  Name: George Freeman MRN: [id number] Date of Birth: 1946-11-17 Referring Provider (PT): Bernita Buffy, NP   Encounter Date: 11/26/2018  PT End of Session - 11/26/18 0935    Visit Number  12    Date for PT Re-Evaluation  12/25/18    PT Start Time  0933    PT Stop Time  1029    PT Time Calculation (min)  56 min    Activity Tolerance  Patient tolerated treatment well    Behavior During Therapy  [facility] for tasks assessed/performed       Past Medical History:  Diagnosis Date  . Anxiety   . Arthritis   . BPV (benign positional vertigo)   . Depression   . GERD (gastroesophageal reflux disease)   . Neuromuscular disorder ([facility])   . Panic disorder   . PONV (postoperative nausea and vomiting)    chills and anxiety after surgery    Past Surgical History:  Procedure Laterality Date  . ANAL FISSURE REPAIR  1993  . APPENDECTOMY  1969  . 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 BACK SURGERY  2011   Stenosis, sciatica  . BACK SURGERY  December 2014  . CERVICAL SPINE SURGERY  Feb. 2014  . CERVICAL SPINE SURGERY  07/23/15   fusion  . COLONOSCOPY    . KNEE SURGERY     2x- cartilage  . left foot surgery  1985  . Left shoulder surgery  1990  . Right shoulder surgery  2012  . TOTAL KNEE ARTHROPLASTY  January 2011  . TOTAL KNEE ARTHROPLASTY  Feb. 2012  . TOTAL SHOULDER ARTHROPLASTY Right 05/23/2017   Procedure: RIGHT TOTAL SHOULDER ARTHROPLASTY;  Surgeon: Cammy Copa, MD;  Location: [facility];  Service: Orthopedics;  Laterality: Right;  Marland Kitchen VASECTOMY      There were no vitals filed for this visit.  Subjective Assessment - 11/26/18 0935    Subjective  Patient reports no pain. He states when he walks he gets pain about 30 minutes into it.     Patient Stated  Goals  get up from sitting without difficulty or pain, walk better    Currently in Pain?  No/denies                       [facility] Adult PT Treatment/Exercise - 11/26/18 0001      Exercises   Exercises  Lumbar      Lumbar Exercises: Machines for Strengthening   Cybex Knee Extension  15# 2x15    Cybex Knee Flexion  35# 2x15    Leg Press  40# 2x15      Lumbar Exercises: Seated   Sit to Stand  10 reps   from chair; no UE     Lumbar Exercises: Supine   Ab Set  15 reps    AB Set Limitations  difficulty grasping TA contraction    Pelvic Tilt  15 reps    Pelvic Tilt Limitations  VCs to breath while holding tilt    Bridge  10 reps    Bridge Limitations  painful until done after TA education    Isometric Hip Flexion  5 reps;5 seconds      Modalities   Modalities  Electrical Stimulation;Moist Heat  Moist Heat Therapy   Number Minutes Moist Heat  15 Minutes    Moist Heat Location  Lumbar Spine      Electrical Stimulation   Electrical Stimulation Location  lumbar/gluts    Electrical Stimulation Action  IFC    Electrical Stimulation Parameters  sitting    Electrical Stimulation Goals  Pain               PT Short Term Goals - 10/29/18 1115      PT SHORT TERM GOAL #1   Title  independent with initial HEP    Status  Achieved        PT Long Term Goals - 11/26/18 0938      PT LONG TERM GOAL #1   Title  decrease pain 50%    Time  8    Period  Weeks    Status  Achieved      PT LONG TERM GOAL #2   Title  understand proper posture and body mechanics    Time  8    Period  Weeks    Status  Achieved            Plan - 11/26/18 1020    Clinical Impression Statement  Patient reports 50% improvement in pain overall since starting PT. He has difficulty isolating transverse abdominus. Majority of time spent working on this today. He was able to engage TA using pelvic floor contraction and posterior pelvic tilt with diaphragmatic breathing but  requires constant cues to isolate. He had decreased pain with bridging after lumbar flex/ext in supine. He reports weakness with sit to stand.    PT Frequency  2x / week    PT Duration  8 weeks    PT Treatment/Interventions  ADLs/Self Care Home Management;Cryotherapy;Electrical Stimulation;Moist Heat;Ultrasound;Therapeutic exercise;Therapeutic activities;Iontophoresis 4mg /ml Dexamethasone;Functional mobility training;Patient/family education;Manual techniques;Dry needling    PT Next Visit Plan  continue to work on transverse abdominus isolation, in supine, sitting and standing; sit to stands,  strength and flexibility    PT Home Exercise Plan  pelvic tilt with breathing, bridging, sit to stand    Consulted and Agree with Plan of Care  Patient       Patient will benefit from skilled therapeutic intervention in order to improve the following deficits and impairments:  Decreased range of motion, Difficulty walking, Increased muscle spasms, Decreased activity tolerance, Pain, Improper body mechanics, Impaired flexibility, Decreased mobility, Decreased strength, Postural dysfunction  Visit Diagnosis: Acute bilateral low back pain without sciatica  Muscle spasm of back     Problem List Patient Active Problem List   Diagnosis Date Noted  . Shoulder arthritis 05/23/2017  . Hereditary and idiopathic peripheral neuropathy 03/24/2015  . 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 Pain of right heel 12/18/2012    Solon PalmJulie Orie Baxendale PT 11/26/2018, 10:26 AM  [facility] [address] [place], [place], [address] Phone: [phone]   Fax:  [phone]  Name: George Freeman MRN: [id number] Date of Birth: September 20, 1946

## 2018-11-26 NOTE — Patient Instructions (Signed)
Abdominal bracing   Flatten back by tightening stomach muscles and buttocks. Hold 5-10 seconds while doing diaphragmatic breathing.  Repeat _10___ times per set. Do _1-3___ sets per session. Do __2__ sessions per day.   Bridge   Lie back, legs bent. Inhale, pressing hips up. Keeping ribs in, lengthen lower back. Exhale, rolling down along spine from top. Repeat 10-30 times. Do __2__ sessions per day.    Sit to Stand    Sit on edge of chair, feet flat on floor. Stand upright, extending knees fully. Repeat 10 times per set. Do _1-3 sets per session. Do __1-2__ sessions per day.  http://orth.exer.us/[email or web address]    Solon PalmJulie Layan Zalenski, PT 11/26/18 10:09 AM [facility] [address] [place], [place], [address] Phone: [phone]   Fax:  [phone]

## 2018-11-28 ENCOUNTER — Ambulatory Visit: Payer: Medicare Other | Admitting: Physical Therapy

## 2018-11-30 ENCOUNTER — Ambulatory Visit: Payer: Medicare Other | Admitting: Physical Therapy

## 2018-11-30 ENCOUNTER — Encounter: Payer: Self-pay | Admitting: Physical Therapy

## 2018-11-30 DIAGNOSIS — M545 Low back pain, unspecified: Secondary | ICD-10-CM

## 2018-11-30 DIAGNOSIS — M62830 Muscle spasm of back: Secondary | ICD-10-CM

## 2018-11-30 DIAGNOSIS — M542 Cervicalgia: Secondary | ICD-10-CM

## 2018-11-30 NOTE — Therapy (Signed)
[facility] [address] [place], [place], [address] Phone: [phone]   Fax:  [phone]  Physical Therapy Evaluation  Patient Details  Name: George Freeman MRN: [id number] Date of Birth: 1946/01/09 Referring Provider (PT): Irene LimboSouska   Encounter Date: 11/30/2018  PT End of Session - 11/30/18 1052    Visit Number  13    Date for PT Re-Evaluation  12/31/18    PT Start Time  0925    PT Stop Time  1015    PT Time Calculation (min)  50 min    Activity Tolerance  Patient tolerated treatment well    Behavior During Therapy  [facility] for tasks assessed/performed       Past Medical History:  Diagnosis Date  . Anxiety   . Arthritis   . BPV (benign positional vertigo)   . Depression   . GERD (gastroesophageal reflux disease)   . Neuromuscular disorder (HCC)   . Panic disorder   . PONV (postoperative nausea and vomiting)    chills and anxiety after surgery    Past Surgical History:  Procedure Laterality Date  . ANAL FISSURE REPAIR  1993  . APPENDECTOMY  1969  . 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 BACK SURGERY  2011   Stenosis, sciatica  . BACK SURGERY  December 2014  . CERVICAL SPINE SURGERY  Feb. 2014  . CERVICAL SPINE SURGERY  07/23/15   fusion  . COLONOSCOPY    . KNEE SURGERY     2x- cartilage  . left foot surgery  1985  . Left shoulder surgery  1990  . Right shoulder surgery  2012  . TOTAL KNEE ARTHROPLASTY  January 2011  . TOTAL KNEE ARTHROPLASTY  Feb. 2012  . TOTAL SHOULDER ARTHROPLASTY Right 05/23/2017   Procedure: RIGHT TOTAL SHOULDER ARTHROPLASTY;  Surgeon: Cammy Copaean, Scott Gregory, MD;  [facility];  Service: Orthopedics;  Laterality: Right;  Marland Kitchen. VASECTOMY      There were no vitals filed for this visit.   Subjective Assessment - 11/30/18 0935    Subjective  Patient has an order for cervical disc disorder eval and treat.  He has a hx of 2 cervical surgeries.  Patient reports that he has had  some increased neck pain over the past year or two.  He reports that he feels very tight and tense.  He reports that he has some decreased ROM with driving    Pertinent History  had a 2 level cervical fusion 2016    Patient Stated Goals  have better ROM in the cervical spine    Currently in Pain?  Yes    Pain Score  3     Pain Location  Neck    Pain Descriptors / Indicators  Tightness    Pain Radiating Towards  denies    Pain Onset  More than a month ago    Pain Frequency  Intermittent    Aggravating Factors   turning head increased pain pain up to 5/10    Pain Relieving Factors  not moving    Effect of Pain on Daily Activities  difficulty turning, looking up         [facility] PT Assessment - 11/30/18 0001      Assessment   Medical Diagnosis  cervical pain    Referring Provider (PT)  Souska    Onset Date/Surgical Date  11/16/18    Prior Therapy  for the Low back  Posture/Postural Control   Posture Comments  some slouched sitting posture      ROM / Strength   AROM / PROM / Strength  AROM;Strength      AROM   Overall AROM Comments  Cervical ROM decreased 50% for all motions except decreased 75% for side bending      Strength   Overall Strength Comments  shoulders 4/5      Palpation   Palpation comment  has spasms in the upper trap and especially the cervical parapsinals                Objective measurements completed on examination: See above findings.      [facility] Adult PT Treatment/Exercise - 11/30/18 0001      Moist Heat Therapy   Number Minutes Moist Heat  15 Minutes    Moist Heat Location  Lumbar Spine;Cervical      Electrical Stimulation   Electrical Stimulation Location  c/t area and the left lumbar area    Electrical Stimulation Action  pre mod     Electrical Stimulation Parameters  sitting    Electrical Stimulation Goals  Pain               PT Short Term Goals - 10/29/18 1115      PT SHORT TERM GOAL #1   Title  independent with  initial HEP    Status  Achieved        PT Long Term Goals - 11/30/18 1057      Additional Long Term Goals   Additional Long Term Goals  Yes      PT LONG TERM GOAL #6   Title  increase cervical ROM 25%    Time  8    Period  Weeks    Status  New      PT LONG TERM GOAL #7   Title  decrease cervical pain 25%    Time  8    Period  Weeks    Status  New             Plan - 11/30/18 1052    Clinical Impression Statement  Patient has a new order to eval an dtreat the cervical spine, has a history of two level fusion 3 years ago, reports that over the past year he has had some increased pain and stiffness with pain and difficulty backing up th ecar.  He has pretty limited cervical ROM, he is very tendse and gaurded with motions, cervical and upper trap mms are in spasms.  He denies any radicular sxs.  He still is having the low back issues that we will continue to treat, he has also c/o abdominal issues, soreness that I thought could have been mms and still could be but it has changed from the right side to the left.  He is tender.  One possibility is that he has favored the back since the surgery and he cannot straighten up and these mms are tight but I do not know why it would cahnge sides    Clinical Presentation  Evolving    Clinical Decision Making  Moderate    Rehab Potential  Good    PT Frequency  2x / week    PT Duration  8 weeks    PT Treatment/Interventions  ADLs/Self Care Home Management;Cryotherapy;Electrical Stimulation;Moist Heat;Ultrasound;Therapeutic exercise;Therapeutic activities;Iontophoresis 4mg /ml Dexamethasone;Functional mobility training;Patient/family education;Manual techniques;Dry needling    PT Next Visit Plan  continue with the LBP treatment, add cervical treatment, he may  call MD about the abdomen    Consulted and Agree with Plan of Care  Patient       Patient will benefit from skilled therapeutic intervention in order to improve the following deficits  and impairments:  Decreased range of motion, Difficulty walking, Increased muscle spasms, Decreased activity tolerance, Pain, Improper body mechanics, Impaired flexibility, Decreased mobility, Decreased strength, Postural dysfunction  Visit Diagnosis: Acute bilateral low back pain without sciatica - Plan: PT plan of care cert/re-cert  Muscle spasm of back - Plan: PT plan of care cert/re-cert  Cervicalgia - Plan: PT plan of care cert/re-cert     Problem List Patient Active Problem List   Diagnosis Date Noted  . Shoulder arthritis 05/23/2017  . Hereditary and idiopathic peripheral neuropathy 03/24/2015  . 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 Pain of right heel 12/18/2012    Jearld Lesch., PT 11/30/2018, 11:48 AM  [facility] [address] [place], [place], [address] Phone: [phone]   Fax:  [phone]  Name: George Freeman MRN: [id number] Date of Birth: 11/20/1946

## 2018-12-04 ENCOUNTER — Ambulatory Visit: Payer: Medicare Other | Admitting: Physical Therapy

## 2018-12-04 DIAGNOSIS — M545 Low back pain: Secondary | ICD-10-CM | POA: Diagnosis not present

## 2018-12-04 DIAGNOSIS — M542 Cervicalgia: Secondary | ICD-10-CM

## 2018-12-04 DIAGNOSIS — M62830 Muscle spasm of back: Secondary | ICD-10-CM

## 2018-12-04 NOTE — Therapy (Signed)
[facility] [address] [place], [place], [address] Phone: [phone]   Fax:  [phone]  Physical Therapy Treatment  Patient Details  Name: George Freeman MRN: [id number] Date of Birth: 11-23-1946 Referring Provider (PT): Irene Limbo   Encounter Date: 12/04/2018  PT End of Session - 12/04/18 0800    Visit Number  14    Date for PT Re-Evaluation  12/31/18    PT Start Time  0800    PT Stop Time  0858    PT Time Calculation (min)  58 min    Activity Tolerance  Patient tolerated treatment well    Behavior During Therapy  [facility] for tasks assessed/performed       Past Medical History:  Diagnosis Date  . Anxiety   . Arthritis   . BPV (benign positional vertigo)   . Depression   . GERD (gastroesophageal reflux disease)   . Neuromuscular disorder (HCC)   . Panic disorder   . PONV (postoperative nausea and vomiting)    chills and anxiety after surgery    Past Surgical History:  Procedure Laterality Date  . ANAL FISSURE REPAIR  1993  . APPENDECTOMY  1969  . 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 BACK SURGERY  2011   Stenosis, sciatica  . BACK SURGERY  December 2014  . CERVICAL SPINE SURGERY  Feb. 2014  . CERVICAL SPINE SURGERY  07/23/15   fusion  . COLONOSCOPY    . KNEE SURGERY     2x- cartilage  . left foot surgery  1985  . Left shoulder surgery  1990  . Right shoulder surgery  2012  . TOTAL KNEE ARTHROPLASTY  January 2011  . TOTAL KNEE ARTHROPLASTY  Feb. 2012  . TOTAL SHOULDER ARTHROPLASTY Right 05/23/2017   Procedure: RIGHT TOTAL SHOULDER ARTHROPLASTY;  Surgeon: Cammy Copa, MD;  Location: [facility] OR;  Service: Orthopedics;  Laterality: Right;  Marland Kitchen VASECTOMY      There were no vitals filed for this visit.  Subjective Assessment - 12/04/18 0802    Subjective  Patent did very well this past weekend with his back. He walked 3 miles with almost no pain on Saturday after last treatment. He continues  to c/o abdominal pain in the morning and after sitting for longer periods. Once he walks it resolves.     Patient Stated Goals  have better ROM in the cervical spine    Currently in Pain?  No/denies                       [facility] Adult PT Treatment/Exercise - 12/04/18 0001      Exercises   Exercises  Neck      Neck Exercises: Theraband   Shoulder Extension  20 reps;Red    Rows  20 reps;Red    Horizontal ABduction  20 reps;Red      Neck Exercises: Seated   Neck Retraction  20 reps    Other Seated Exercise  ROM x 10 rotation      Lumbar Exercises: Aerobic   Nustep  L5 x 6 min      Modalities   Modalities  Electrical Stimulation;Moist Heat      Moist Heat Therapy   Number Minutes Moist Heat  15 Minutes    Moist Heat Location  Lumbar Spine;Cervical      Electrical Stimulation   Electrical Stimulation Location  bil UT and left low back  Electrical Stimulation Action  premod    Electrical Stimulation Parameters  prone    Electrical Stimulation Goals  Pain;Tone      Manual Therapy   Manual Therapy  Soft tissue mobilization    Soft tissue mobilization  to bil UT, levator and left lumbar             PT Education - 12/04/18 0851    Education Details  HEP    Person(s) Educated  Patient    Methods  Explanation;Demonstration;Handout    Comprehension  Verbalized understanding;Returned demonstration       PT Short Term Goals - 10/29/18 1115      PT SHORT TERM GOAL #1   Title  independent with initial HEP    Status  Achieved        PT Long Term Goals - 11/30/18 1057      Additional Long Term Goals   Additional Long Term Goals  Yes      PT LONG TERM GOAL #6   Title  increase cervical ROM 25%    Time  8    Period  Weeks    Status  New      PT LONG TERM GOAL #7   Title  decrease cervical pain 25%    Time  8    Period  Weeks    Status  New            Plan - 12/04/18 [id number]    Clinical Impression Statement  Patient did well with neck  and scapular TE today with no c/o pain. Advised ROM at home in addition to HEP. He reported relief after last treatment in the low back allowing him to walk without pain.    PT Treatment/Interventions  ADLs/Self Care Home Management;Cryotherapy;Electrical Stimulation;Moist Heat;Ultrasound;Therapeutic exercise;Therapeutic activities;Iontophoresis 4mg /ml Dexamethasone;Functional mobility training;Patient/family education;Manual techniques;Dry needling    PT Next Visit Plan  continue with the LBP and cervical treatment, he may call MD about the abdomen    PT Home Exercise Plan  pelvic tilt with breathing, bridging, sit to stand, neck stretches, cerv retraction, scap stab red band    Consulted and Agree with Plan of Care  Patient       Patient will benefit from skilled therapeutic intervention in order to improve the following deficits and impairments:  Decreased range of motion, Difficulty walking, Increased muscle spasms, Decreased activity tolerance, Pain, Improper body mechanics, Impaired flexibility, Decreased mobility, Decreased strength, Postural dysfunction  Visit Diagnosis: Cervicalgia  Muscle spasm of back     Problem List Patient Active Problem List   Diagnosis Date Noted  . Shoulder arthritis 05/23/2017  . Hereditary and idiopathic peripheral neuropathy 03/24/2015  . 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 Pain of right heel 12/18/2012    Solon PalmJulie Alyssha Housh PT 12/04/2018, 8:54 AM  [facility] [address] [place], [place], [address] Phone: [phone]   Fax:  [phone]  Name: George Freeman MRN: [id number] Date of Birth: [birth year]

## 2018-12-04 NOTE — Patient Instructions (Signed)
  Strengthening: Chest Pull - Resisted   With resistive band looped around each hand, and arms straight out in front, stretch band across chest. Repeat __10__ times per set. Do 1-3____ sets per session. Do _1___ sessions per day.  http://orth.exer.us/926   Copyright  VHI. All rights reserved.   Resistive Band Rowing   With resistive band anchored in door, grasp both ends. Keeping elbows bent, pull back, squeezing shoulder blades together. Hold _3-5___ seconds. Repeat _10-30___ times. Do ____ sessions per day. 1 http://gt2.exer.us/98   Copyright  VHI. All rights reserved.   Strengthening: Resisted Extension   Hold tubing with both hands, arms forward. Pull arms back, elbow straight. Repeat _10-30___ times per set. Do ____ sets per session. Do _1___ sessions per day.   Flexibility: Upper Trapezius Stretch   Gently grasp right side of head while reaching behind back with other hand. Tilt head away until a gentle stretch is felt. Hold 30 seconds. Repeat 3 times per set. Do 2 sessions per day.  http://orth.exer.us/340   Levator Stretch   Grasp seat or sit on hand on side to be stretched. Turn head toward other side and look down. Use hand on head to gently stretch neck in that position. Hold _30___ seconds. Repeat on other side. Repeat 3 times. Do 2 sessions per day.   Flexibility: Neck Retraction   Pull head straight back, keeping eyes and jaw level. Hold 3-5 seconds. Repeat _10 times per set. Do 3-5  sessions per day.  http://orth.exer.us/344   Posture - Sitting   Sit upright, head facing forward. Try using a roll to support lower back. Keep shoulders relaxed, and avoid rounded back. Keep hips level with knees. Avoid crossing legs for long periods.  George PalmJulie Dyamon Sosinski, PT 12/04/18 8:25 AM [facility] [address] [place], [place], [address] Phone: [phone]   Fax:  [phone]

## 2018-12-05 ENCOUNTER — Ambulatory Visit: Payer: Medicare Other | Admitting: Physical Therapy

## 2018-12-07 ENCOUNTER — Ambulatory Visit: Payer: Medicare Other | Admitting: Physical Therapy

## 2018-12-07 DIAGNOSIS — M545 Low back pain: Secondary | ICD-10-CM | POA: Diagnosis not present

## 2018-12-07 DIAGNOSIS — M542 Cervicalgia: Secondary | ICD-10-CM

## 2018-12-07 DIAGNOSIS — M62830 Muscle spasm of back: Secondary | ICD-10-CM

## 2018-12-07 NOTE — Therapy (Signed)
[place], [place], [address] Phone: [phone]   Fax:  [phone]  Physical Therapy Treatment  Patient Details  Name: George Freeman MRN: [id number] Date of Birth: 03-27-1946 Referring Provider (PT): Carollee Massed   Encounter Date: 12/07/2018  PT End of Session - 12/07/18 0929    Visit Number  15    Date for PT Re-Evaluation  12/31/18    PT Start Time  0929    PT Stop Time  1035    PT Time Calculation (min)  66 min    Activity Tolerance  Patient tolerated treatment well    Behavior During Therapy  [facility] for tasks assessed/performed       Past Medical History:  Diagnosis Date  . Anxiety   . Arthritis   . BPV (benign positional vertigo)   . Depression   . GERD (gastroesophageal reflux disease)   . Neuromuscular disorder ([place])   . Panic disorder   . PONV (postoperative nausea and vomiting)    chills and anxiety after surgery    Past Surgical History:  Procedure Laterality Date  . ANAL FISSURE REPAIR  1993  . APPENDECTOMY  1969  . 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 BACK SURGERY  2011   Stenosis, sciatica  . BACK SURGERY  December 2014  . CERVICAL SPINE SURGERY  Feb. 2014  . CERVICAL SPINE SURGERY  07/23/15   fusion  . COLONOSCOPY    . KNEE SURGERY     2x- cartilage  . left foot surgery  1985  . Left shoulder surgery  1990  . Right shoulder surgery  2012  . TOTAL KNEE ARTHROPLASTY  January 2011  . TOTAL KNEE ARTHROPLASTY  Feb. 2012  . TOTAL SHOULDER ARTHROPLASTY Right 05/23/2017   Procedure: RIGHT TOTAL SHOULDER ARTHROPLASTY;  Surgeon: Meredith Pel, MD;  Location: [facility];  Service: Orthopedics;  Laterality: Right;  Marland Kitchen VASECTOMY      There were no vitals filed for this visit.  Subjective Assessment - 12/07/18 0933    Subjective  Patient reports no pain today in back or neck. He reports stiffness in his neck.    Patient Stated Goals  have better ROM in the cervical  spine    Currently in Pain?  No/denies                       [facility] Adult PT Treatment/Exercise - 12/07/18 0001      Neck Exercises: Machines for Strengthening   Cybex Row  35# 2x10    Lat Pull  35# 2x10; 2 way      Neck Exercises: Theraband   Shoulder Extension  20 reps;Red    Rows  20 reps;Red    Horizontal ABduction  20 reps;Red      Neck Exercises: Seated   Neck Retraction  20 reps   second set with rotation 10 ea way     Lumbar Exercises: Stretches   Other Lumbar Stretch Exercise  QL stretch seated to lying with legs still hanging down x 30 sec      Lumbar Exercises: Aerobic   Stationary Bike  L3 x 5 min    UBE (Upper Arm Bike)  L5 x 6 min 14fd/3bwd      Manual Therapy   Manual Therapy  Soft tissue mobilization;Myofascial release    Soft tissue mobilization  to bil UT, levator and left lumbar  Myofascial Release  left QL release in [facility]             PT Education - 12/07/18 1126    Education Details  HEP    Person(s) Educated  Patient    Methods  Explanation;Demonstration;Handout    Comprehension  Returned demonstration;Verbalized understanding       PT Short Term Goals - 10/29/18 1115      PT SHORT TERM GOAL #1   Title  independent with initial HEP    Status  Achieved        PT Long Term Goals - 12/07/18 1127      PT LONG TERM GOAL #6   Title  increase cervical ROM 25%    Time  8    Period  Weeks    Status  On-going      PT LONG TERM GOAL #7   Title  decrease cervical pain 25%    Time  8    Period  Weeks    Status  Partially Met            Plan - 12/07/18 1128    Clinical Impression Statement  Patient presents today with only reports of stiffness in his neck. He reports continued pain in back with ambulation. He was very tight in his left QL today which we worked on in prone and SLDY and a stretch was issued for HEP. LTGs are progressing.    Rehab Potential  Good    PT Frequency  2x / week    PT Duration  8 weeks     PT Treatment/Interventions  ADLs/Self Care Home Management;Cryotherapy;Electrical Stimulation;Moist Heat;Ultrasound;Therapeutic exercise;Therapeutic activities;Iontophoresis 28m/ml Dexamethasone;Functional mobility training;Patient/family education;Manual techniques;Dry needling    PT Next Visit Plan  continue with the LBP and cervical treatment, he may call MD about the abdomen    Consulted and Agree with Plan of Care  Patient       Patient will benefit from skilled therapeutic intervention in order to improve the following deficits and impairments:  Decreased range of motion, Difficulty walking, Increased muscle spasms, Decreased activity tolerance, Pain, Improper body mechanics, Impaired flexibility, Decreased mobility, Decreased strength, Postural dysfunction  Visit Diagnosis: Cervicalgia  Muscle spasm of back     Problem List Patient Active Problem List   Diagnosis Date Noted  . Shoulder arthritis 05/23/2017  . Hereditary and idiopathic peripheral neuropathy 03/24/2015  . 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 Pain of right heel 12/18/2012   JMadelyn FlavorsPT 12/07/2018, 11:31 AM  [place] [address]Phone: [phone]  Fax:  [phone] Name: IDONTREL SMETHERSMRN: [id number] of Birth: [birth year]

## 2018-12-10 ENCOUNTER — Encounter: Payer: Medicare Other | Admitting: Physical Therapy

## 2018-12-11 ENCOUNTER — Encounter: Payer: Self-pay | Admitting: Physical Therapy

## 2018-12-11 ENCOUNTER — Ambulatory Visit: Payer: Medicare Other | Admitting: Physical Therapy

## 2018-12-11 DIAGNOSIS — M545 Low back pain, unspecified: Secondary | ICD-10-CM

## 2018-12-11 DIAGNOSIS — M62830 Muscle spasm of back: Secondary | ICD-10-CM

## 2018-12-11 DIAGNOSIS — M542 Cervicalgia: Secondary | ICD-10-CM

## 2018-12-11 NOTE — Therapy (Signed)
[place], [place], [address] Phone: [phone]   Fax:  [phone]  Physical Therapy Treatment  Patient Details  Name: George Freeman MRN: [id number] Date of Birth: 10-Jul-1946 Referring Provider (PT): Carollee Massed   Encounter Date: 12/11/2018  PT End of Session - 12/11/18 1003    Visit Number  16    Date for PT Re-Evaluation  12/31/18    PT Start Time  0924    PT Stop Time  1018    PT Time Calculation (min)  54 min    Activity Tolerance  Patient tolerated treatment well    Behavior During Therapy  [facility] for tasks assessed/performed       Past Medical History:  Diagnosis Date  . Anxiety   . Arthritis   . BPV (benign positional vertigo)   . Depression   . GERD (gastroesophageal reflux disease)   . Neuromuscular disorder ([place])   . Panic disorder   . PONV (postoperative nausea and vomiting)    chills and anxiety after surgery    Past Surgical History:  Procedure Laterality Date  . ANAL FISSURE REPAIR  1993  . APPENDECTOMY  1969  . 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 BACK SURGERY  2011   Stenosis, sciatica  . BACK SURGERY  December 2014  . CERVICAL SPINE SURGERY  Feb. 2014  . CERVICAL SPINE SURGERY  07/23/15   fusion  . COLONOSCOPY    . KNEE SURGERY     2x- cartilage  . left foot surgery  1985  . Left shoulder surgery  1990  . Right shoulder surgery  2012  . TOTAL KNEE ARTHROPLASTY  January 2011  . TOTAL KNEE ARTHROPLASTY  Feb. 2012  . TOTAL SHOULDER ARTHROPLASTY Right 05/23/2017   Procedure: RIGHT TOTAL SHOULDER ARTHROPLASTY;  Surgeon: Meredith Pel, MD;  Location: [place];  Service: Orthopedics;  Laterality: Right;  Marland Kitchen VASECTOMY      There were no vitals filed for this visit.  Subjective Assessment - 12/11/18 0925    Subjective  Reports that he is feeling well, having less back and neck pain, reports that the abdomen is still a problem, he will be having a  colonoscopy in the next week.    Currently in Pain?  Yes    Pain Score  2     Pain Location  Back    Pain Orientation  Left;Lower    Aggravating Factors   walking                       [facility] Adult PT Treatment/Exercise - 12/11/18 0001      Neck Exercises: Theraband   Shoulder Extension  20 reps;Red    Rows  20 reps;Red      Lumbar Exercises: Stretches   Passive Hamstring Stretch  3 reps;20 seconds    Lower Trunk Rotation  3 reps;20 seconds    Piriformis Stretch  3 reps;20 seconds      Lumbar Exercises: Aerobic   Nustep  L5 x 6 min      Lumbar Exercises: Machines for Strengthening   Other Lumbar Machine Exercise  seated row 35#, lats 35# 2x10, triceps 35#, 2x15, 15# AR press      Modalities   Modalities  Electrical Stimulation;Moist Heat      Moist Heat Therapy   Number Minutes Moist Heat  12 Minutes    Moist Heat Location  Lumbar Spine;Cervical      Electrical Stimulation   Electrical Stimulation Location  bil UT and left low back    Electrical Stimulation Action  premod    Electrical Stimulation Parameters  sitting    Electrical Stimulation Goals  Pain      Manual Therapy   Manual Therapy  Soft tissue mobilization;Myofascial release    Soft tissue mobilization  to bil UT, levator and left lumbar    Myofascial Release  to the left lumbar area, some occipital release               PT Short Term Goals - 10/29/18 1115      PT SHORT TERM GOAL #1   Title  independent with initial HEP    Status  Achieved        PT Long Term Goals - 12/11/18 1006      PT LONG TERM GOAL #3   Title  increase lumbar ROM 25%    Status  Partially Met      PT LONG TERM GOAL #4   Title  get up from sitting without any difficulty    Status  Partially Met      PT LONG TERM GOAL #6   Title  increase cervical ROM 25%    Status  Partially Met            Plan - 12/11/18 1004    Clinical Impression Statement  Patient with tightness in the cervical spine  and the left lumbar area, he is tight in the HS and the piriformis mms.  He reports that the best relief is with the stretching and the STM.    PT Next Visit Plan  continue with the current plan, he may have less visits due to the colonoscopy, the holidays and him travelling    Consulted and Agree with Plan of Care  Patient       Patient will benefit from skilled therapeutic intervention in order to improve the following deficits and impairments:  Decreased range of motion, Difficulty walking, Increased muscle spasms, Decreased activity tolerance, Pain, Improper body mechanics, Impaired flexibility, Decreased mobility, Decreased strength, Postural dysfunction  Visit Diagnosis: Cervicalgia  Muscle spasm of back  Acute bilateral low back pain without sciatica     Problem List Patient Active Problem List   Diagnosis Date Noted  . Shoulder arthritis 05/23/2017  . Hereditary and idiopathic peripheral neuropathy 03/24/2015  . 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 Pain of right heel 12/18/2012    Sumner Boast., PT 12/11/2018, 10:07 AM  [place], [place], [address] Phone: [phone]   Fax:  [phone]  Name: WILLE AUBUCHON MRN: [id number] Date of Birth: [birth year]

## 2018-12-14 ENCOUNTER — Ambulatory Visit: Payer: Medicare Other | Admitting: Physical Therapy

## 2018-12-17 ENCOUNTER — Encounter: Payer: Self-pay | Admitting: Physical Therapy

## 2018-12-17 ENCOUNTER — Ambulatory Visit: Payer: Medicare Other | Admitting: Physical Therapy

## 2018-12-17 ENCOUNTER — Other Ambulatory Visit: Payer: Self-pay | Admitting: Gastroenterology

## 2018-12-17 DIAGNOSIS — M545 Low back pain, unspecified: Secondary | ICD-10-CM

## 2018-12-17 DIAGNOSIS — M62830 Muscle spasm of back: Secondary | ICD-10-CM

## 2018-12-17 DIAGNOSIS — M542 Cervicalgia: Secondary | ICD-10-CM

## 2018-12-17 DIAGNOSIS — K5790 Diverticulosis of intestine, part unspecified, without perforation or abscess without bleeding: Secondary | ICD-10-CM

## 2018-12-17 DIAGNOSIS — R1030 Lower abdominal pain, unspecified: Secondary | ICD-10-CM

## 2018-12-17 NOTE — Therapy (Signed)
[facility] [address] [place], [place], [address] Phone: [phone]   Fax:  [phone]  Physical Therapy Treatment  Patient Details  Name: George Freeman MRN: [id number] Date of Birth: 07/11/1946 Referring Provider (PT): Souska   Encounter Date: 12/17/2018  PT End of Session - 12/17/18 1054    Visit Number  17    Date for PT Re-Evaluation  12/31/18    PT Start Time  1010    PT Stop Time  1108    PT Time Calculation (min)  58 min    Activity Tolerance  Patient tolerated treatment well    Behavior During Therapy  [facility] for tasks assessed/performed       Past Medical History:  Diagnosis Date  . Anxiety   . Arthritis   . BPV (benign positional vertigo)   . Depression   . GERD (gastroesophageal reflux disease)   . Neuromuscular disorder ([facility])   . Panic disorder   . PONV (postoperative nausea and vomiting)    chills and anxiety after surgery    Past Surgical History:  Procedure Laterality Date  . ANAL FISSURE REPAIR  1993  . APPENDECTOMY  1969  . 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 BACK SURGERY  2011   Stenosis, sciatica  . BACK SURGERY  December 2014  . CERVICAL SPINE SURGERY  Feb. 2014  . CERVICAL SPINE SURGERY  07/23/15   fusion  . COLONOSCOPY    . KNEE SURGERY     2x- cartilage  . left foot surgery  1985  . Left shoulder surgery  1990  . Right shoulder surgery  2012  . TOTAL KNEE ARTHROPLASTY  January 2011  . TOTAL KNEE ARTHROPLASTY  Feb. 2012  . TOTAL SHOULDER ARTHROPLASTY Right 05/23/2017   Procedure: RIGHT TOTAL SHOULDER ARTHROPLASTY;  Surgeon: Dean, Scott Gregory, MD;  [facility];  Service: Orthopedics;  Laterality: Right;  . VASECTOMY      There were no vitals filed for this visit.  Subjective Assessment - 12/17/18 1011    Subjective  Had a colonoscopy Friday, did not feel well Saturday.  Reports today feeling a little batter    Currently in Pain?  Yes    Pain Score  2      Pain Location  Back    Pain Orientation  Left;Lower    Aggravating Factors   walking long distance                       [facility] Adult PT Treatment/Exercise - 12/17/18 0001      Lumbar Exercises: Aerobic   Nustep  L5 x 6 min      Lumbar Exercises: Machines for Strengthening   Cybex Knee Extension  15# 2x15    Cybex Knee Flexion  35# 2x15    Other Lumbar Machine Exercise  seated row 35#, lats 35# 2x10, triceps 35#, 2x15, 15# AR press    Other Lumbar Machine Exercise  35lb straight arm pull downs 2x15, 15lb AR press x10      Lumbar Exercises: Supine   Other Supine Lumbar Exercises  feet on ball K2C, trunk rotation, small bridges and isometric abs      Modalities   Modalities  Electrical Stimulation;Moist Heat      Moist Heat Therapy   Number Minutes Moist Heat  12 Minutes    Moist Heat Location  Lumbar Spine;Cervical        Acupuncturist Location  bil UT and left low back    Child psychotherapist Parameters  sitting    Electrical Stimulation Goals  Pain      Manual Therapy   Manual Therapy  Soft tissue mobilization;Myofascial release    Soft tissue mobilization  to bil UT, levator and left lumbar    Myofascial Release  to the left lumbar area, some occipital release               PT Short Term Goals - 10/29/18 1115      PT SHORT TERM GOAL #1   Title  independent with initial HEP    Status  Achieved        PT Long Term Goals - 12/11/18 1006      PT LONG TERM GOAL #3   Title  increase lumbar ROM 25%    Status  Partially Met      PT LONG TERM GOAL #4   Title  get up from sitting without any difficulty    Status  Partially Met      PT LONG TERM GOAL #6   Title  increase cervical ROM 25%    Status  Partially Met            Plan - 12/17/18 1054    Clinical Impression Statement  Patient overall doing well, reports that he was able to do some walking without  increased pain.  He is still stiff and tight in the mms of the low back and the cervical spine    PT Next Visit Plan  continue with the current plan, he may have less visits due to the colonoscopy, the holidays and him travelling    Consulted and Agree with Plan of Care  Patient       Patient will benefit from skilled therapeutic intervention in order to improve the following deficits and impairments:  Decreased range of motion, Difficulty walking, Increased muscle spasms, Decreased activity tolerance, Pain, Improper body mechanics, Impaired flexibility, Decreased mobility, Decreased strength, Postural dysfunction  Visit Diagnosis: Cervicalgia  Muscle spasm of back  Acute bilateral low back pain without sciatica     Problem List Patient Active Problem List   Diagnosis Date Noted  . Shoulder arthritis 05/23/2017  . Hereditary and idiopathic peripheral neuropathy 03/24/2015  . 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 Pain of right heel 12/18/2012    Sumner Boast., PT 12/17/2018, 10:55 AM  [place], [place], [address] Phone: [phone]   Fax:  [phone]  Name: George Freeman MRN: [id number] Date of Birth: 12-04-1946

## 2018-12-20 ENCOUNTER — Encounter: Payer: Self-pay | Admitting: Physical Therapy

## 2018-12-20 ENCOUNTER — Ambulatory Visit: Payer: Medicare Other | Admitting: Physical Therapy

## 2018-12-20 ENCOUNTER — Ambulatory Visit
Admission: RE | Admit: 2018-12-20 | Discharge: 2018-12-20 | Disposition: A | Discharge status: Home / Self Care | Payer: Medicare Other | Source: Ambulatory Visit | Attending: Gastroenterology | Admitting: Gastroenterology

## 2018-12-20 DIAGNOSIS — M542 Cervicalgia: Secondary | ICD-10-CM

## 2018-12-20 DIAGNOSIS — R1030 Lower abdominal pain, unspecified: Secondary | ICD-10-CM

## 2018-12-20 DIAGNOSIS — M545 Low back pain, unspecified: Secondary | ICD-10-CM

## 2018-12-20 DIAGNOSIS — M62830 Muscle spasm of back: Secondary | ICD-10-CM

## 2018-12-20 DIAGNOSIS — K5790 Diverticulosis of intestine, part unspecified, without perforation or abscess without bleeding: Secondary | ICD-10-CM

## 2018-12-20 MED ORDER — IOPAMIDOL (ISOVUE-300) INJECTION 61%
100.0000 mL | Freq: Once | INTRAVENOUS | Status: AC | PRN
  Administered 2018-12-20: 100 mL via INTRAVENOUS

## 2018-12-20 NOTE — Therapy (Signed)
[place], [place], [address] Phone: [phone]   Fax:  [phone]  Physical Therapy Treatment  Patient Details  Name: George Freeman MRN: [id number] Date of Birth: Oct 14, 1946 Referring Provider (PT): Carollee Massed   Encounter Date: 12/20/2018  PT End of Session - 12/20/18 1130    Visit Number  18    Date for PT Re-Evaluation  12/31/18    PT Start Time  1014    PT Stop Time  1114    PT Time Calculation (min)  60 min    Activity Tolerance  Patient tolerated treatment well    Behavior During Therapy  [facility] for tasks assessed/performed       Past Medical History:  Diagnosis Date  . Anxiety   . Arthritis   . BPV (benign positional vertigo)   . Depression   . GERD (gastroesophageal reflux disease)   . Neuromuscular disorder ([place])   . Panic disorder   . PONV (postoperative nausea and vomiting)    chills and anxiety after surgery    Past Surgical History:  Procedure Laterality Date  . ANAL FISSURE REPAIR  1993  . APPENDECTOMY  1969  . 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 BACK SURGERY  2011   Stenosis, sciatica  . BACK SURGERY  December 2014  . CERVICAL SPINE SURGERY  Feb. 2014  . CERVICAL SPINE SURGERY  07/23/15   fusion  . COLONOSCOPY    . KNEE SURGERY     2x- cartilage  . left foot surgery  1985  . Left shoulder surgery  1990  . Right shoulder surgery  2012  . TOTAL KNEE ARTHROPLASTY  January 2011  . TOTAL KNEE ARTHROPLASTY  Feb. 2012  . TOTAL SHOULDER ARTHROPLASTY Right 05/23/2017   Procedure: RIGHT TOTAL SHOULDER ARTHROPLASTY;  Surgeon: Meredith Pel, MD;  Location: [facility];  Service: Orthopedics;  Laterality: Right;  Marland Kitchen VASECTOMY      There were no vitals filed for this visit.  Subjective Assessment - 12/20/18 1025    Subjective  I am doing really well, I think the massage helps    Currently in Pain?  Yes    Pain Score  2     Pain Location  Back   neck                       [facility] Adult PT Treatment/Exercise - 12/20/18 0001      Neck Exercises: Machines for Strengthening   Nustep  level 5 x 6 minutes    Cybex Row  35# 2x10    Lat Pull  35# 2x10; 2 way      Lumbar Exercises: Machines for Strengthening   Cybex Knee Extension  15# 2x15    Cybex Knee Flexion  35# 2x15    Other Lumbar Machine Exercise  10# each arm staright pulls for abs      Modalities   Modalities  Electrical Stimulation;Moist Heat      Moist Heat Therapy   Number Minutes Moist Heat  12 Minutes    Moist Heat Location  Lumbar Spine;Cervical      Electrical Stimulation   Electrical Stimulation Location  bil UT and left low back    Electrical Stimulation Action  premod    Electrical Stimulation Parameters  sitting    Electrical Stimulation Goals  Pain      Manual Therapy   Manual Therapy  Soft tissue mobilization;Myofascial release    Soft tissue mobilization  to bil UT, levator and left lumbar    Myofascial Release  to the left lumbar area, some occipital release               PT Short Term Goals - 10/29/18 1115      PT SHORT TERM GOAL #1   Title  independent with initial HEP    Status  Achieved        PT Long Term Goals - 12/20/18 1131      PT LONG TERM GOAL #1   Title  decrease pain 50%    Status  Achieved      PT LONG TERM GOAL #2   Title  understand proper posture and body mechanics    Status  Achieved      PT LONG TERM GOAL #3   Title  increase lumbar ROM 25%    Status  Partially Met      PT LONG TERM GOAL #4   Title  get up from sitting without any difficulty    Status  Achieved            Plan - 12/20/18 1130    Clinical Impression Statement  Patient report that he is having less pain, feels like he has more mobility and has been able to walk normally wihtout pain increase.  he is still having some issues wth tightness and spasms as well as the abdominal issues, had a colonoscopy last week and is having  another test this afternoon.    PT Next Visit Plan  he is to try the HEP and stay active and stretch over the next 10 days and then return to [place]    Consulted and Agree with Plan of Care  Patient       Patient will benefit from skilled therapeutic intervention in order to improve the following deficits and impairments:  Decreased range of motion, Difficulty walking, Increased muscle spasms, Decreased activity tolerance, Pain, Improper body mechanics, Impaired flexibility, Decreased mobility, Decreased strength, Postural dysfunction  Visit Diagnosis: Cervicalgia  Muscle spasm of back  Acute bilateral low back pain without sciatica     Problem List Patient Active Problem List   Diagnosis Date Noted  . Shoulder arthritis 05/23/2017  . Hereditary and idiopathic peripheral neuropathy 03/24/2015  . 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 Pain of right heel 12/18/2012    Sumner Boast., PT 12/20/2018, 11:32 AM  [place], [place], [address] Phone: [phone]   Fax:  [phone]  Name: PHINEHAS GROUNDS MRN: [id number] Date of Birth: 11/24/1946

## 2018-12-28 ENCOUNTER — Encounter: Payer: Self-pay | Admitting: Physical Therapy

## 2018-12-28 ENCOUNTER — Ambulatory Visit: Payer: Medicare Other | Attending: *Deleted | Admitting: Physical Therapy

## 2018-12-28 DIAGNOSIS — M545 Low back pain, unspecified: Secondary | ICD-10-CM

## 2018-12-28 DIAGNOSIS — M62830 Muscle spasm of back: Secondary | ICD-10-CM | POA: Diagnosis present

## 2018-12-28 DIAGNOSIS — M542 Cervicalgia: Secondary | ICD-10-CM | POA: Diagnosis not present

## 2018-12-28 NOTE — Therapy (Signed)
[place], [place], [address] Phone: [phone]   Fax:  [phone]  Physical Therapy Treatment  Patient Details  Name: George Freeman MRN: [id number] Date of Birth: 09/28/1946 Referring Provider (PT): Carollee Massed   Encounter Date: 12/28/2018  PT End of Session - 12/28/18 1055    Visit Number  19    Date for PT Re-Evaluation  12/31/18    PT Start Time  0923    PT Stop Time  1020    PT Time Calculation (min)  57 min    Activity Tolerance  Patient tolerated treatment well    Behavior During Therapy  [facility] for tasks assessed/performed       Past Medical History:  Diagnosis Date  . Anxiety   . Arthritis   . BPV (benign positional vertigo)   . Depression   . GERD (gastroesophageal reflux disease)   . Neuromuscular disorder ([place])   . Panic disorder   . PONV (postoperative nausea and vomiting)    chills and anxiety after surgery    Past Surgical History:  Procedure Laterality Date  . ANAL FISSURE REPAIR  1993  . APPENDECTOMY  1969  . 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 BACK SURGERY  2011   Stenosis, sciatica  . BACK SURGERY  December 2014  . CERVICAL SPINE SURGERY  Feb. 2014  . CERVICAL SPINE SURGERY  07/23/15   fusion  . COLONOSCOPY    . KNEE SURGERY     2x- cartilage  . left foot surgery  1985  . Left shoulder surgery  1990  . Right shoulder surgery  2012  . TOTAL KNEE ARTHROPLASTY  January 2011  . TOTAL KNEE ARTHROPLASTY  Feb. 2012  . TOTAL SHOULDER ARTHROPLASTY Right 05/23/2017   Procedure: RIGHT TOTAL SHOULDER ARTHROPLASTY;  Surgeon: Meredith Pel, MD;  Location: [facility];  Service: Orthopedics;  Laterality: Right;  Marland Kitchen VASECTOMY      There were no vitals filed for this visit.  Subjective Assessment - 12/28/18 0926    Subjective  Patient reports that he has been doing pretty well.    Currently in Pain?  Yes    Pain Score  2     Pain Location  Back    Pain  Orientation  Left;Lower    Aggravating Factors   worse after sitting for periods                       [facility] Adult PT Treatment/Exercise - 12/28/18 0001      Neck Exercises: Machines for Strengthening   Nustep  level 5 x 6 minutes      Lumbar Exercises: Stretches   Passive Hamstring Stretch  5 reps;20 seconds    Lower Trunk Rotation  3 reps;10 seconds    Quad Stretch  4 reps;20 seconds    Piriformis Stretch  4 reps;20 seconds      Lumbar Exercises: Machines for Strengthening   Cybex Knee Extension  15# 2x15    Cybex Knee Flexion  35# 2x15    Leg Press  40# 2x15      Lumbar Exercises: Supine   Bridge  10 reps    Bridge Limitations  eeds cues to engage core, if not this will cause an increase of pain      Moist Heat Therapy   Number Minutes Moist Heat  12 Minutes    Moist Heat Location  Lumbar Spine;Cervical      Electrical Stimulation   Electrical Stimulation Location  left low back    Electrical Stimulation Action  IFC    Electrical Stimulation Parameters  sitting    Electrical Stimulation Goals  Pain      Manual Therapy   Manual Therapy  Soft tissue mobilization;Myofascial release    Soft tissue mobilization  to bil UT, levator and left lumbar    Myofascial Release  to the left lumbar area, some occipital release               PT Short Term Goals - 10/29/18 1115      PT SHORT TERM GOAL #1   Title  independent with initial HEP    Status  Achieved        PT Long Term Goals - 12/28/18 1057      PT LONG TERM GOAL #3   Title  increase lumbar ROM 25%    Status  Partially Met            Plan - 12/28/18 1055    Clinical Impression Statement  Patient reports that he had been doing very well when he was coming here consistently and when he was doing more at home, he reports that he has not been able to do at home.  He has pain in the back with any bridges but this can be decresaed with core activation.    PT Next Visit Plan  asked  patient to have wife come in next visit to educate her in the PROM that he seems to respond well to    Consulted and Agree with Plan of Care  Patient       Patient will benefit from skilled therapeutic intervention in order to improve the following deficits and impairments:  Decreased range of motion, Difficulty walking, Increased muscle spasms, Decreased activity tolerance, Pain, Improper body mechanics, Impaired flexibility, Decreased mobility, Decreased strength, Postural dysfunction  Visit Diagnosis: Cervicalgia  Muscle spasm of back  Acute bilateral low back pain without sciatica     Problem List Patient Active Problem List   Diagnosis Date Noted  . Shoulder arthritis 05/23/2017  . Hereditary and idiopathic peripheral neuropathy 03/24/2015  . 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 Pain of right heel 12/18/2012    Sumner Boast., PT 12/28/2018, 10:59 AM  [place], [place], [address] Phone: [phone]   Fax:  [phone]  Name: George Freeman MRN: [id number] Date of Birth: Jun 19, 1946

## 2018-12-31 ENCOUNTER — Encounter: Payer: Self-pay | Admitting: Physical Therapy

## 2018-12-31 ENCOUNTER — Ambulatory Visit: Payer: Medicare Other | Admitting: Physical Therapy

## 2018-12-31 DIAGNOSIS — M542 Cervicalgia: Secondary | ICD-10-CM

## 2018-12-31 DIAGNOSIS — M545 Low back pain, unspecified: Secondary | ICD-10-CM

## 2018-12-31 DIAGNOSIS — M62830 Muscle spasm of back: Secondary | ICD-10-CM

## 2018-12-31 NOTE — Therapy (Signed)
[address] [place], [place], [address] Phone: [phone]   Fax:  [phone] Progress Note Reporting Period 11/21/18 to 12/31/18 for visits 11-20  See note below for Objective Data and Assessment of Progress/Goals.      Physical Therapy Treatment  Patient Details  Name: George Freeman MRN: [id number] Date of Birth: 1946-05-11 Referring Provider (PT): Carollee Massed   Encounter Date: 12/31/2018  PT End of Session - 12/31/18 1009    Visit Number  20    Date for PT Re-Evaluation  02/01/19    PT Start Time  0925    PT Stop Time  1023    PT Time Calculation (min)  58 min    Activity Tolerance  Patient tolerated treatment well    Behavior During Therapy  [facility] for tasks assessed/performed       Past Medical History:  Diagnosis Date  . Anxiety   . Arthritis   . BPV (benign positional vertigo)   . Depression   . GERD (gastroesophageal reflux disease)   . Neuromuscular disorder ([place])   . Panic disorder   . PONV (postoperative nausea and vomiting)    chills and anxiety after surgery    Past Surgical History:  Procedure Laterality Date  . ANAL FISSURE REPAIR  1993  . APPENDECTOMY  1969  . 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 BACK SURGERY  2011   Stenosis, sciatica  . BACK SURGERY  December 2014  . CERVICAL SPINE SURGERY  Feb. 2014  . CERVICAL SPINE SURGERY  07/23/15   fusion  . COLONOSCOPY    . KNEE SURGERY     2x- cartilage  . left foot surgery  1985  . Left shoulder surgery  1990  . Right shoulder surgery  2012  . TOTAL KNEE ARTHROPLASTY  January 2011  . TOTAL KNEE ARTHROPLASTY  Feb. 2012  . TOTAL SHOULDER ARTHROPLASTY Right 05/23/2017   Procedure: RIGHT TOTAL SHOULDER ARTHROPLASTY;  Surgeon: Meredith Pel, MD;  Location: [place];  Service: Orthopedics;  Laterality: Right;  Marland Kitchen VASECTOMY      There were no vitals filed for this visit.  Subjective Assessment - 12/31/18 0929    Subjective  Patient brought in his wife for education on how to help him at home    Currently in Pain?  Yes    Pain Score  2     Pain Location  Back    Pain Orientation  Left;Lower    Pain Relieving Factors  the stretches seemed to help a lot                       [facility] Adult PT Treatment/Exercise - 12/31/18 0001      Neck Exercises: Machines for Strengthening   Cybex Row  35# 2x10    Lat Pull  35# 2x10      Lumbar Exercises: Aerobic   Nustep  L5 x 6 min      Lumbar Exercises: Machines for Strengthening   Cybex Knee Extension  15# 2x15    Cybex Knee Flexion  35# 2x15    Leg Press  40# 2x15      Moist Heat Therapy   Number Minutes Moist Heat  12 Minutes    Moist Heat Location  Lumbar Spine;Cervical      Electrical Stimulation   Electrical Stimulation Location  left low back    Electrical Stimulation Action  IFC    Electrical Stimulation Parameters  sitting    Electrical Stimulation Goals  Pain             PT Education - 12/31/18 1008    Education Details  Patient brought in his wife, I showed her how to do stretching of the HS, piriformis and quads, she took pictures and then demonstrated the same, we talked about pressure and her body mechanics to not injur herself, tried and demoed several ways for her       PT Short Term Goals - 10/29/18 1115      PT SHORT TERM GOAL #1   Title  independent with initial HEP    Status  Achieved        PT Long Term Goals - 12/31/18 1010      PT LONG TERM GOAL #1   Title  decrease pain 50%    Status  Achieved      PT LONG TERM GOAL #2   Title  understand proper posture and body mechanics    Status  Achieved      PT LONG TERM GOAL #3   Title  increase lumbar ROM 25%    Status  Partially Met      PT LONG TERM GOAL #4   Title  get up from sitting without any difficulty    Status  Achieved            Plan - 12/31/18 1009    Clinical Impression Statement  Patietn had his wife come in and we went  over stretches at home that she could assist with, she needed to take some pictures to help her remember as well as we tried several ways to decrease stress on her body to perform this.    PT Next Visit Plan  see how the vacation goes and then possibly focus on the continued stretch and ROM of the LE's and back    Consulted and Agree with Plan of Care  Patient       Patient will benefit from skilled therapeutic intervention in order to improve the following deficits and impairments:  Decreased range of motion, Difficulty walking, Increased muscle spasms, Decreased activity tolerance, Pain, Improper body mechanics, Impaired flexibility, Decreased mobility, Decreased strength, Postural dysfunction  Visit Diagnosis: Cervicalgia - Plan: PT plan of care cert/re-cert  Muscle spasm of back - Plan: PT plan of care cert/re-cert  Acute bilateral low back pain without sciatica - Plan: PT plan of care cert/re-cert     Problem List Patient Active Problem List   Diagnosis Date Noted  . Shoulder arthritis 05/23/2017  . Hereditary and idiopathic peripheral neuropathy 03/24/2015  . 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 Pain of right heel 12/18/2012    ALBRIGHT,MICHAEL W., PT 12/31/2018, 10:13 AM  [facility] [address] [place], [place], [address] Phone: [phone]   Fax:  [phone]  Name: Christhoper J Heap MRN: [id number] Date of Birth: 12/16/1946   

## 2019-02-26 ENCOUNTER — Encounter (INDEPENDENT_AMBULATORY_CARE_PROVIDER_SITE_OTHER): Payer: Self-pay | Admitting: Family Medicine

## 2019-02-26 ENCOUNTER — Ambulatory Visit (INDEPENDENT_AMBULATORY_CARE_PROVIDER_SITE_OTHER): Payer: Medicare Other | Admitting: Family Medicine

## 2019-02-26 DIAGNOSIS — M65331 Trigger finger, right middle finger: Secondary | ICD-10-CM | POA: Diagnosis not present

## 2019-02-26 IMAGING — CR DG SHOULDER 2+V PORT*R*
3 series · 3 of 3 positions shown · non-contrast
Comparison: 03/31/2017 CT shoulder.

CLINICAL DATA: 71-year-old male post shoulder replacement. Initial
encounter.

EXAM:
PORTABLE RIGHT SHOULDER

[AP (1 of 3)]
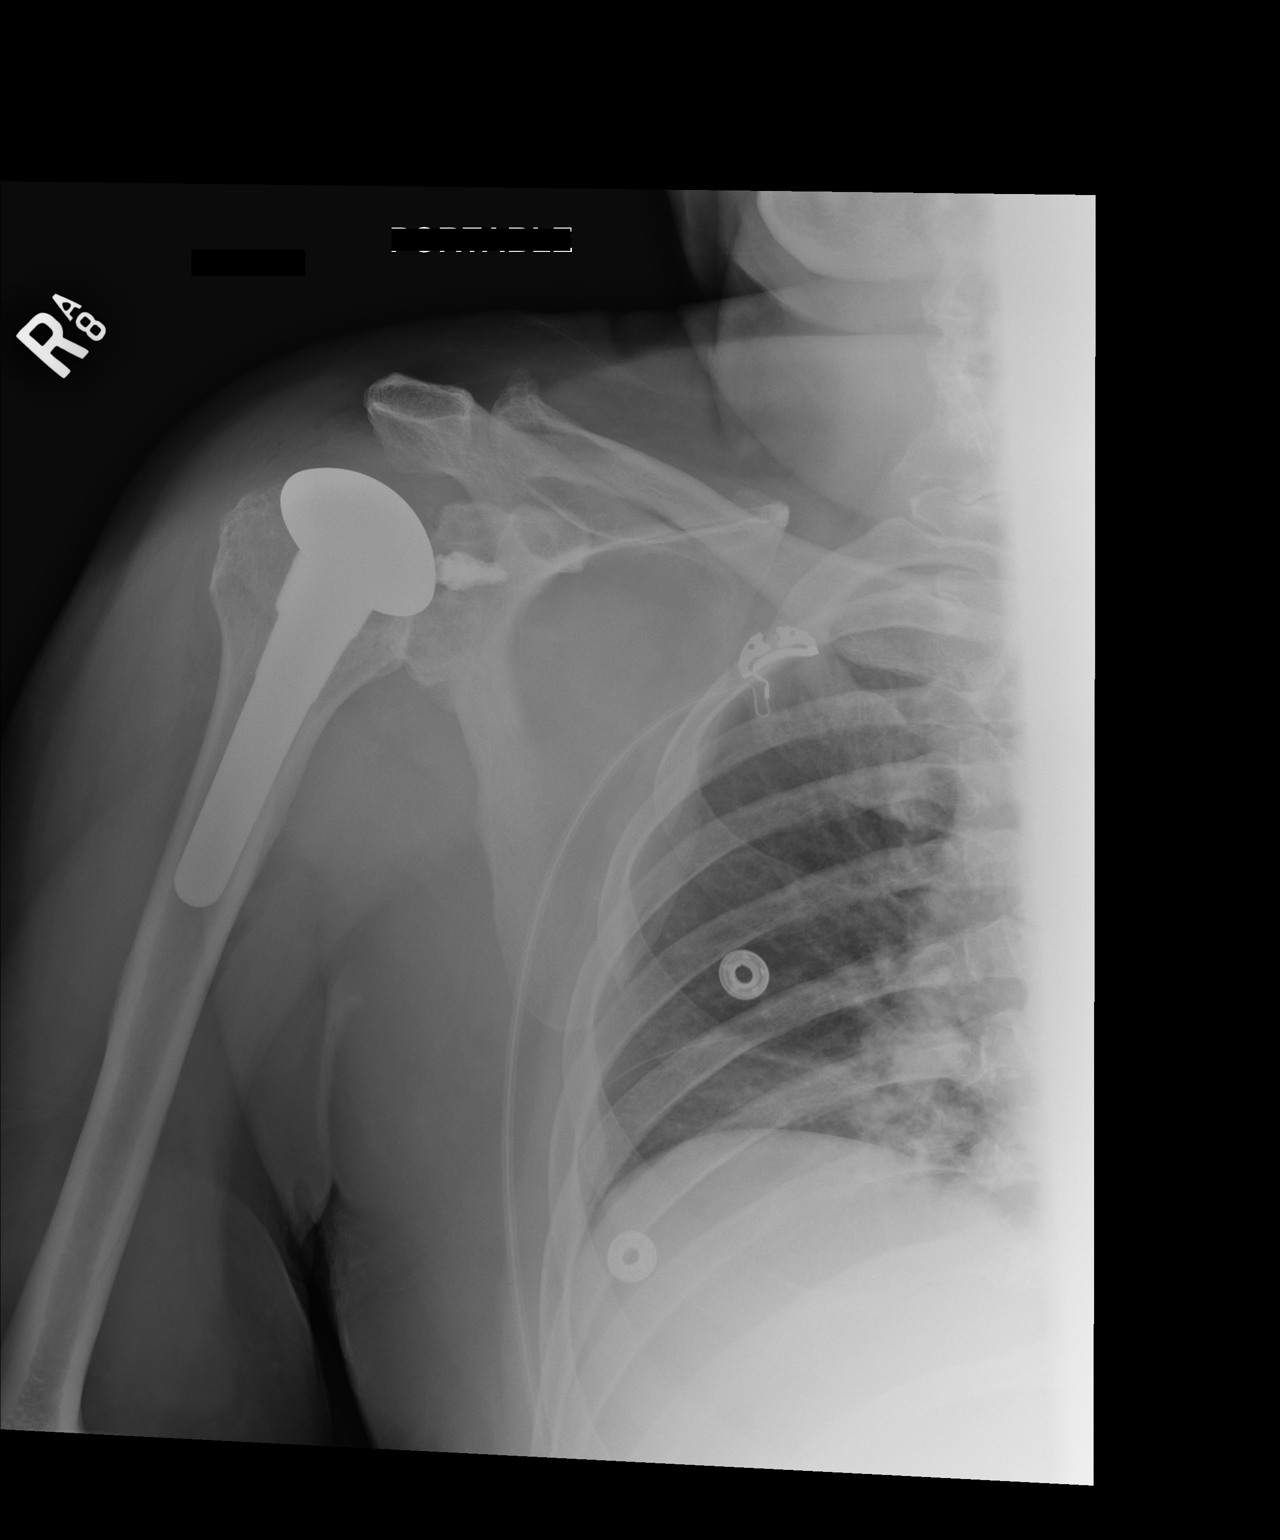

[AP (2 of 3)]
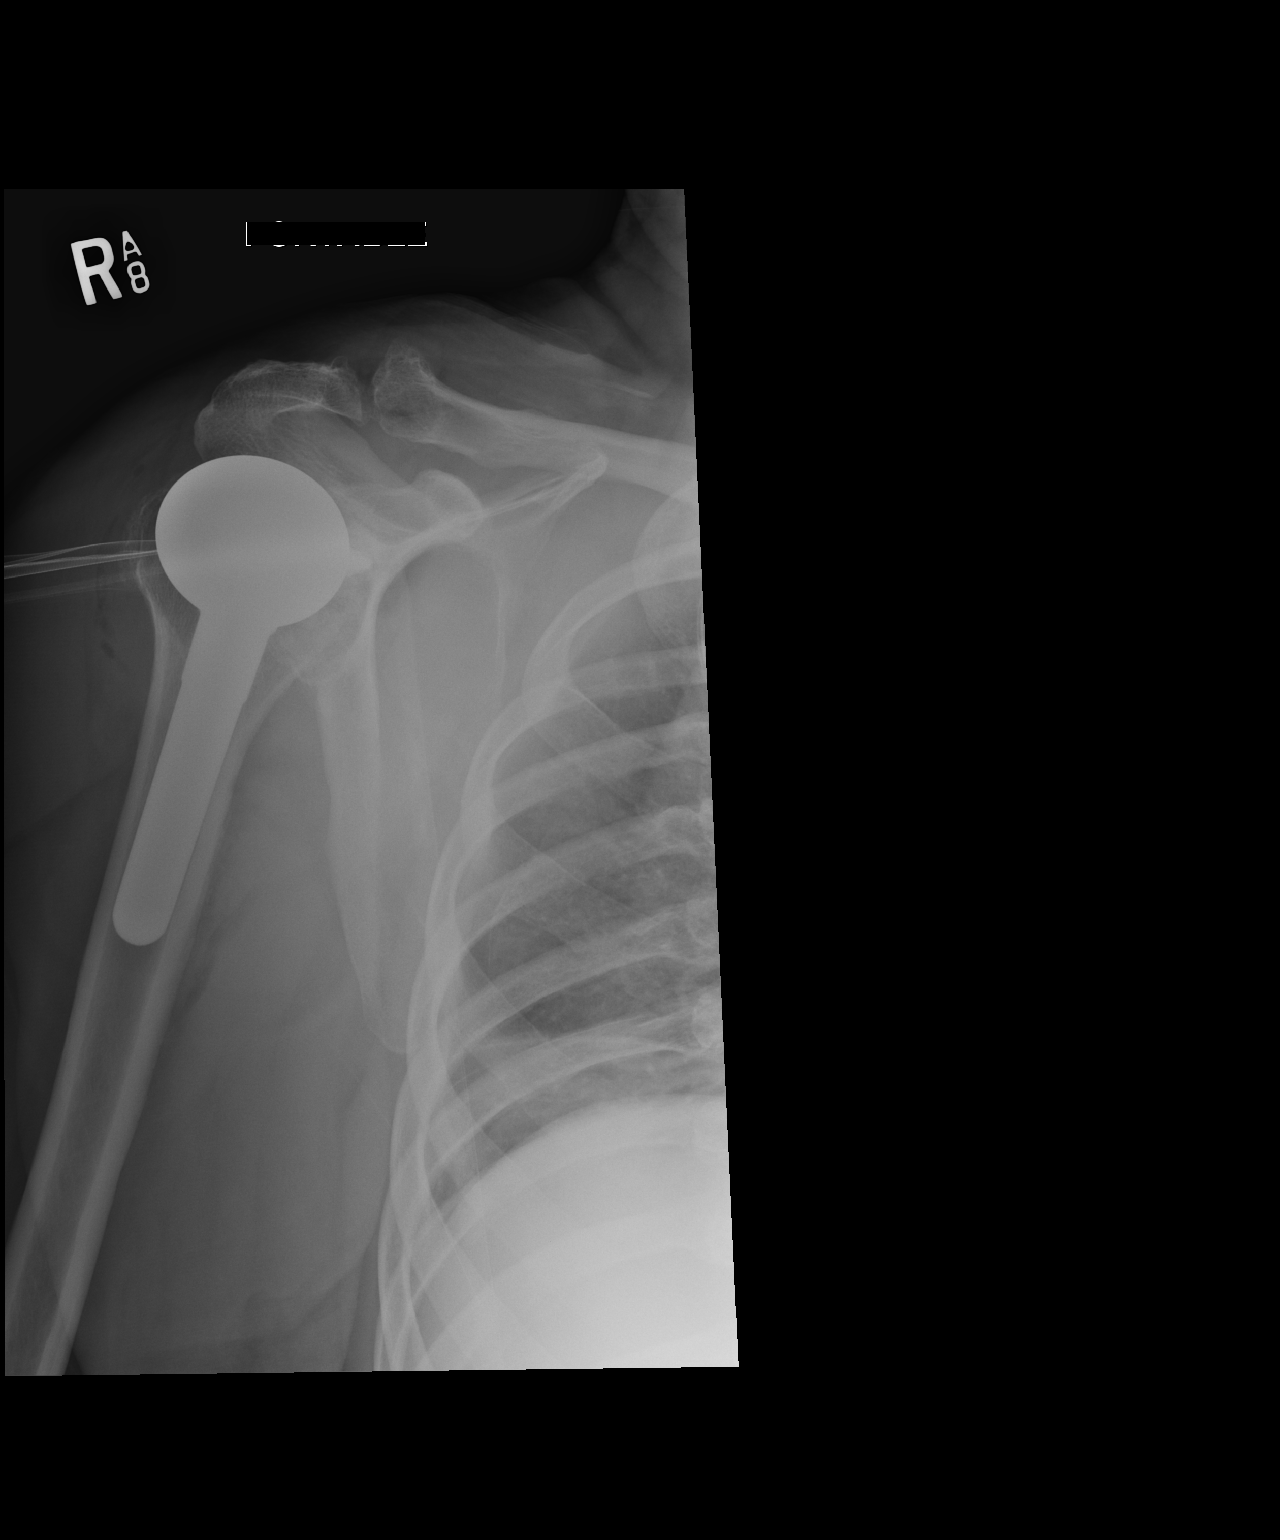

[AP (3 of 3)]
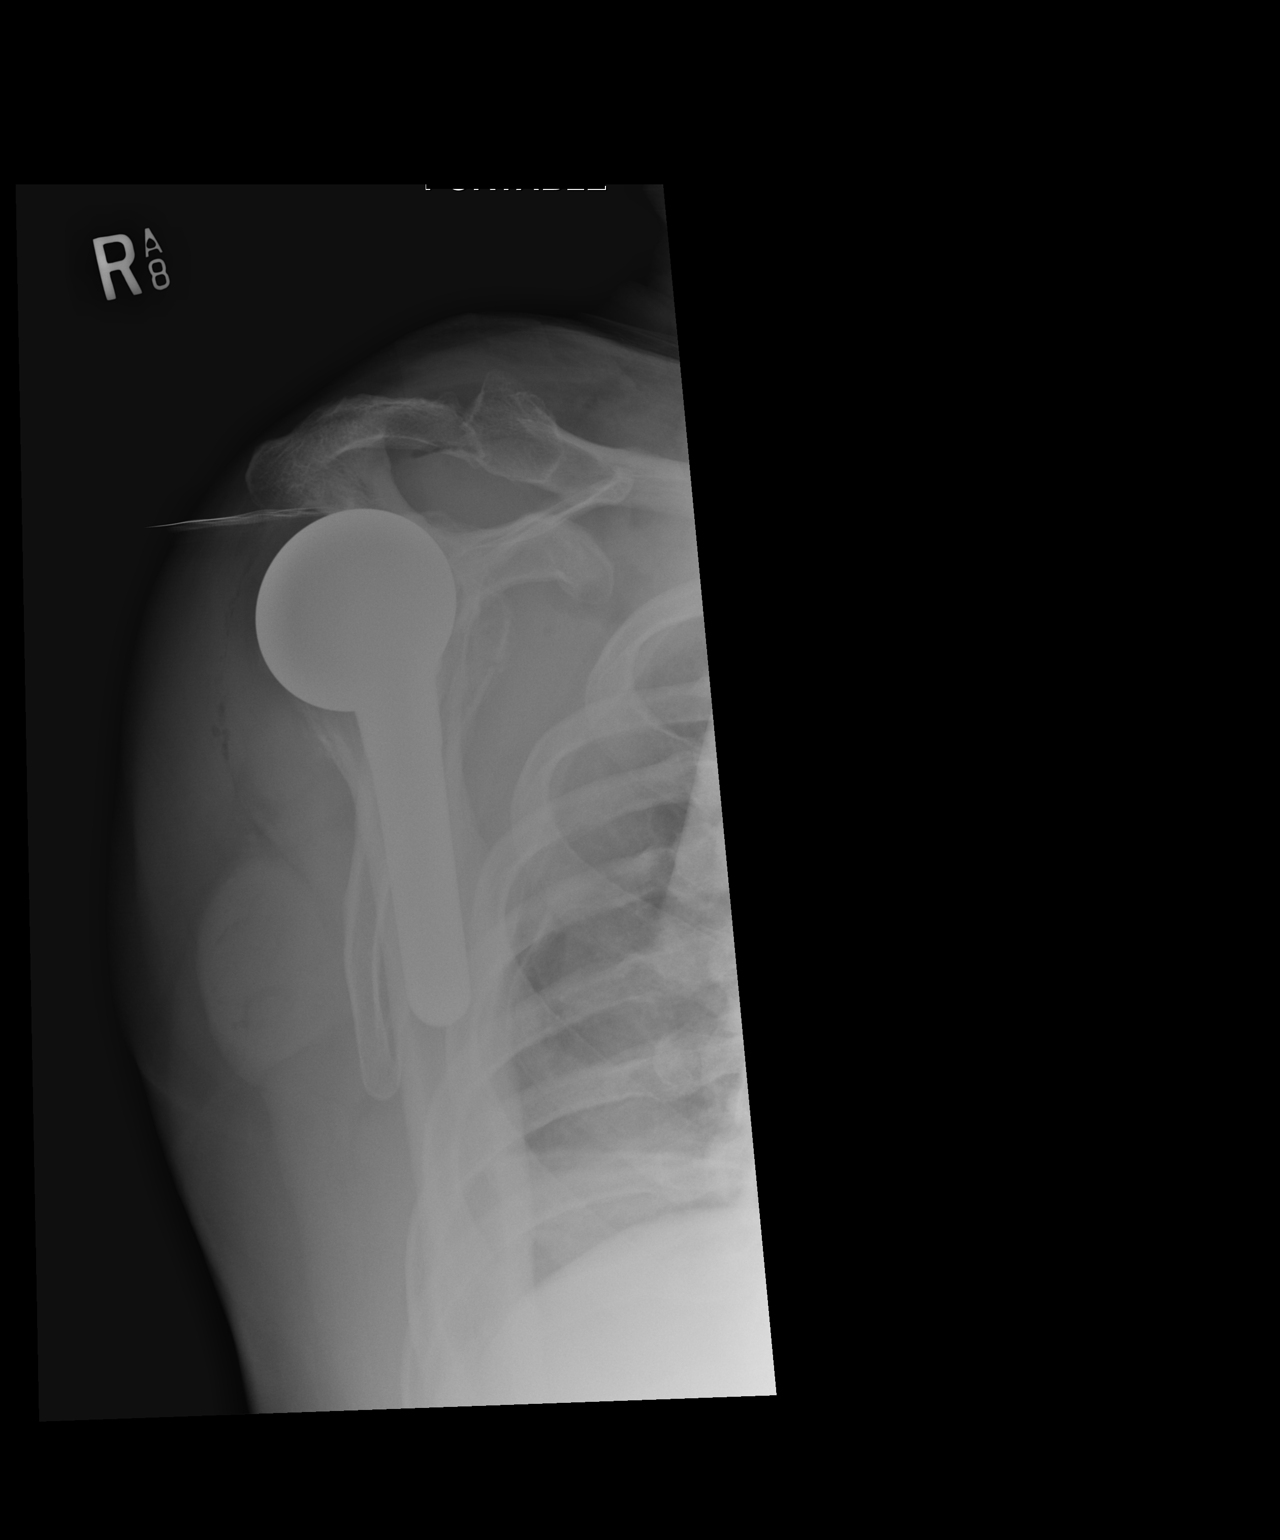

[3 of 3 positions shown; findings below may reference images not displayed]

FINDINGS: Post right shoulder replacement without complication noted.

Acromioclavicular joint degenerative changes.

Mild pulmonary vascular prominence/ minimal atelectasis.
IMPRESSION: Post right shoulder replacement without complication noted.

## 2019-02-26 MED ORDER — METHYLPREDNISOLONE ACETATE 40 MG/ML IJ SUSP: 20.0000 mg | Freq: Once | INTRAMUSCULAR | Status: AC

## 2019-02-26 NOTE — Progress Notes (Signed)
  George Freeman - 73 y.o. male MRN [id number]  Date of birth: [birth year]    SUBJECTIVE:      Chief Complaint: Right trigger finger, middle finger  HPI: 73 year old male with triggering of his right middle finger for the past 1 to 2 months.  He reports pain over the volar aspect of third MCP.  He states that his finger triggers consistently and he frequently wakes up with his middle finger flexed and he has to forcibly extend it.  He denies any significant swelling.  No erythema or bruising.  He has had several trigger fingers in the past which have responded well to steroid injections.    Patient requesting steroid injection. No numbness or tingling    ROS:     See HPI  PERTINENT  PMH / PSH FH / / SH:  Past Medical, Surgical, Social, and Family History Reviewed & Updated in the EMR.    OBJECTIVE: There were no vitals taken for this visit.  Physical Exam:  Vital signs are reviewed.  GEN: Alert and oriented, NAD Pulm: Breathing unlabored PSY: normal mood, congruent affect  MSK: Right wrist/hand: Inspection: No obvious deformity. No swelling, erythema or bruising Palpation: Tenderness over the A1 pulley of the third digit ROM: Full ROM of the digits and wrist.  Triggering noted with flexion/extension of middle finger Strength: 5/5 strength in the wrist and hand Neurovascular: NV intact  Left hand/wrist: No deformity or swelling No focal tenderness Full range of motion.  No triggering of the fingers 5/5 strength N/V intact distally   ASSESSMENT & PLAN:  1.  Right middle finger trigger finger-patient has had several of these previously which have not responded well to steroid injections. - Said injection performed as described below. -Follow-up as needed   After informed written consent timeout was performed and patient was seated in chair of exam room.  Area overlying right 3rd digit A1 pulley prepped with alcohol swab then injected with 1.59mL:0.5mL lidocaine using a  27ga needle: depomedrol. Injection done under US guidance  Patient tolerated procedure well without immediate complications.

## 2019-02-26 NOTE — Progress Notes (Signed)
I saw and examined the patient with Dr. Jamse Mead and agree with assessment and plan as outlined.  Right third trigger finger injected today.  He will follow-up PRN.

## 2019-05-13 ENCOUNTER — Other Ambulatory Visit: Payer: Self-pay

## 2019-05-13 ENCOUNTER — Encounter: Payer: Self-pay | Admitting: Orthopedic Surgery

## 2019-05-13 ENCOUNTER — Ambulatory Visit: Payer: Medicare Other | Admitting: Orthopedic Surgery

## 2019-05-13 DIAGNOSIS — M65332 Trigger finger, left middle finger: Secondary | ICD-10-CM | POA: Diagnosis not present

## 2019-05-13 DIAGNOSIS — M79642 Pain in left hand: Secondary | ICD-10-CM

## 2019-05-15 ENCOUNTER — Encounter: Payer: Self-pay | Admitting: Orthopedic Surgery

## 2019-05-15 DIAGNOSIS — M65332 Trigger finger, left middle finger: Secondary | ICD-10-CM

## 2019-05-15 MED ORDER — LIDOCAINE HCL 1 % IJ SOLN
3.0000 mL | INTRAMUSCULAR | Status: AC | PRN
  Administered 2019-05-15: 3 mL

## 2019-05-15 MED ORDER — METHYLPREDNISOLONE ACETATE 40 MG/ML IJ SUSP
13.3300 mg | INTRAMUSCULAR | Status: AC | PRN
  Administered 2019-05-15: 13.33 mg

## 2019-05-15 MED ORDER — BUPIVACAINE HCL 0.25 % IJ SOLN
0.3300 mL | INTRAMUSCULAR | Status: AC | PRN
  Administered 2019-05-15: 12:00:00 .33 mL

## 2019-05-15 NOTE — Progress Notes (Signed)
Office Visit Note   Patient: George Freeman           Date of Birth: 1946/05/07           MRN: [id number] Visit Date: 05/13/2019 Requested by: Tracey Harries, MD [address] [place], [place] [address] PCP: Tracey Harries, MD  Subjective: Chief Complaint  Patient presents with  . Hand Pain    left index trigger finger   Patient presents with left index finger triggering.  He has had this injected before.  He is right-hand dominant.  HPI: He describes pain and triggering in that left index finger which is interfering with his ability to play the guitar.              ROS: All systems reviewed are negative as they relate to the chief complaint within the history of present illness.  Patient denies  fevers or chills.   Assessment & Plan: Visit Diagnoses:  1. Pain of left hand     Plan: Impression is left index finger triggering.  Second injection performed today.  Really would not want inject that index finger anymore unless it is absolutely required.  I would favor surgical intervention due to risk of tendon rupture.  I will see him back as needed  Follow-Up Instructions: Return if symptoms worsen or fail to improve.   Orders:  No orders of the defined types were placed in this encounter.  No orders of the defined types were placed in this encounter.     Procedures: Hand/UE Inj: L index A1 for trigger finger on 05/15/2019 11:49 AM Indications: therapeutic Details: 25 G needle, volar approach Medications: 0.33 mL bupivacaine 0.25 %; 13.33 mg methylPREDNISolone acetate 40 MG/ML; 3 mL lidocaine 1 % Outcome: tolerated well, no immediate complications Procedure, treatment alternatives, risks and benefits explained, specific risks discussed. Consent was given by the patient. Immediately prior to procedure a time out was called to verify the correct patient, procedure, equipment, support staff and site/side marked as required. Patient was prepped and draped in the  usual sterile fashion.       Clinical Data: No additional findings.  Objective: Vital Signs: There were no vitals taken for this visit.  Physical Exam:   Constitutional: Patient appears well-developed HEENT:  Head: Normocephalic Eyes:EOM are normal Neck: Normal range of motion Cardiovascular: Normal rate Pulmonary/chest: Effort normal Neurologic: Patient is alert Skin: Skin is warm Psychiatric: Patient has normal mood and affect    Ortho Exam: Ortho exam demonstrates full active and passive range of motion of the finger but with tenderness at the A1 pulley and triggering is present on the left-hand side.  EPL FPL interosseous function is intact.  Specialty Comments:  No specialty comments available.  Imaging: No results found.   PMFS History: Patient Active Problem List   Diagnosis Date Noted  . Shoulder arthritis 05/23/2017  . Hereditary and idiopathic peripheral neuropathy 03/24/2015  . 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 Pain of right heel 12/18/2012   Past Medical History:  Diagnosis Date  . Anxiety   . Arthritis   . BPV (benign positional vertigo)   . Depression   . GERD (gastroesophageal reflux disease)   . Neuromuscular disorder ([facility])   . Panic disorder   . PONV (postoperative nausea and vomiting)    chills and anxiety after surgery    Family History  Problem Relation Age of Onset  . Diabetes Mother   . Alzheimer's disease  Sister   . Acute myelogenous leukemia Unknown   . Neuropathy Neg Hx     Past Surgical History:  Procedure Laterality Date  . ANAL FISSURE REPAIR  1993  . APPENDECTOMY  1969  . 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 BACK SURGERY  2011   Stenosis, sciatica  . BACK SURGERY  December 2014  . CERVICAL SPINE SURGERY  Feb. 2014  . CERVICAL SPINE SURGERY  07/23/15   fusion  . COLONOSCOPY    . KNEE SURGERY     2x- cartilage  . left foot surgery  1985  . Left shoulder surgery  1990  .  Right shoulder surgery  2012  . TOTAL KNEE ARTHROPLASTY  January 2011  . TOTAL KNEE ARTHROPLASTY  Feb. 2012  . TOTAL SHOULDER ARTHROPLASTY Right 05/23/2017   Procedure: RIGHT TOTAL SHOULDER ARTHROPLASTY;  Surgeon: Cammy Copaean, Scott Tyshea Imel, MD;  Location: [facility] OR;  Service: Orthopedics;  Laterality: Right;  Marland Kitchen. VASECTOMY     Social History   Occupational History  . Occupation: Retired  Tobacco Use  . Smoking status: Never Smoker  . Smokeless tobacco: Never Used  Substance and Sexual Activity  . Alcohol use: No    Alcohol/week: 0.0 standard drinks  . Drug use: No  . Sexual activity: Not on file

## 2019-05-27 ENCOUNTER — Other Ambulatory Visit: Payer: Self-pay

## 2019-05-27 ENCOUNTER — Ambulatory Visit: Payer: Medicare Other | Attending: Family Medicine | Admitting: Physical Therapy

## 2019-05-27 ENCOUNTER — Encounter: Payer: Self-pay | Admitting: Physical Therapy

## 2019-05-27 DIAGNOSIS — M62830 Muscle spasm of back: Secondary | ICD-10-CM | POA: Diagnosis present

## 2019-05-27 DIAGNOSIS — R42 Dizziness and giddiness: Secondary | ICD-10-CM | POA: Insufficient documentation

## 2019-05-27 DIAGNOSIS — M545 Low back pain, unspecified: Secondary | ICD-10-CM

## 2019-05-27 DIAGNOSIS — M25551 Pain in right hip: Secondary | ICD-10-CM | POA: Diagnosis present

## 2019-05-27 NOTE — Therapy (Signed)
[facility] [address] [place], [place], [address] Phone: [phone]   Fax:  [phone]  Physical Therapy Evaluation  Patient Details  Name: George Freeman MRN: [id number] Date of Birth: [birth year] Referring Provider (PT): Bouska   Encounter Date: 05/27/2019  PT End of Session - 05/27/19 1225    Visit Number  1    Date for PT Re-Evaluation  07/27/19    PT Start Time  1010    PT Stop Time  1055    PT Time Calculation (min)  45 min    Activity Tolerance  Patient tolerated treatment well    Behavior During Therapy  [facility] for tasks assessed/performed       Past Medical History:  Diagnosis Date  . Anxiety   . Arthritis   . BPV (benign positional vertigo)   . Depression   . GERD (gastroesophageal reflux disease)   . Neuromuscular disorder ([facility])   . Panic disorder   . PONV (postoperative nausea and vomiting)    chills and anxiety after surgery    Past Surgical History:  Procedure Laterality Date  . ANAL FISSURE REPAIR  1993  . APPENDECTOMY  1969  . 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 BACK SURGERY  2011   Stenosis, sciatica  . BACK SURGERY  December 2014  . CERVICAL SPINE SURGERY  Feb. 2014  . CERVICAL SPINE SURGERY  07/23/15   fusion  . COLONOSCOPY    . KNEE SURGERY     2x- cartilage  . left foot surgery  1985  . Left shoulder surgery  1990  . Right shoulder surgery  2012  . TOTAL KNEE ARTHROPLASTY  January 2011  . TOTAL KNEE ARTHROPLASTY  Feb. 2012  . TOTAL SHOULDER ARTHROPLASTY Right 05/23/2017   Procedure: RIGHT TOTAL SHOULDER ARTHROPLASTY;  Surgeon: Cammy Copa, MD;  Location: [facility] OR;  Service: Orthopedics;  Laterality: Right;  Marland Kitchen VASECTOMY      There were no vitals filed for this visit.   Subjective Assessment - 05/27/19 1015    Subjective  Patient was seen here late last year after a lumbar surgery, he was doing very well but was having right anterior hip, groin and stomach  pain.  He had MRI for hernia issues but all was negative.  REcent MD visit the MD flet like it was right psoas mms.      Pertinent History  had a 2 level cervical fusion 2016    Limitations  Sitting;Walking;Standing    Patient Stated Goals  be able to get up from sitting without difficulty    Currently in Pain?  Yes    Pain Score  2     Pain Location  Groin   also some pain in the left low back   Pain Orientation  Right    Pain Descriptors / Indicators  Aching;Tightness;Spasm    Pain Type  Acute pain    Pain Radiating Towards  no    Pain Onset  More than a month ago    Pain Frequency  Intermittent    Aggravating Factors   walking causes left low back pain, sitting and then when he gets up will cause the right groin pain. pain can be up to /10    Pain Relieving Factors  resting when walking, not sitting for long periods pain can go away with the medication    Effect of Pain on Daily Activities  getting up  from sitting, walking         [facility] PT Assessment - 05/27/19 0001      Assessment   Medical Diagnosis  right psoas and left low back pain    Referring Provider (PT)  Bouska    Onset Date/Surgical Date  04/26/19    Prior Therapy  yes      Precautions   Precautions  None      Balance Screen   Has the patient fallen in the past 6 months  No    Has the patient had a decrease in activity level because of a fear of falling?   No    Is the patient reluctant to leave their home because of a fear of falling?   No      Home Environment   Additional Comments  light housework      Prior Function   Level of Independence  Independent    Vocation  Retired    Leisure  likes to travel, some walking and hiking      Posture/Postural Control   Posture Comments  slouched posture, difficulty standing up straight, tends to be slightly forward flexed trunk and to the right      Strength   Overall Strength Comments  right hip flexion 4-/5 with mild right groin discomfort, other tests WFL's       Flexibility   Soft Tissue Assessment /Muscle Length  yes    Hamstrings  tight    Quadriceps  tight    ITB  tight    Piriformis  tight      Palpation   Palpation comment  tender in the right groin and up into the abdomen, very tender in the left SI area                Objective measurements completed on examination: See above findings.              PT Education - 05/27/19 1225    Education Details  gave two different ways to do a psoas stretch at home    Person(s) Educated  Patient    Methods  Explanation;Demonstration;Tactile cues;Verbal cues    Comprehension  Verbalized understanding       PT Short Term Goals - 05/27/19 1230      PT SHORT TERM GOAL #1   Title  independent with initial HEP    Time  2    Period  Weeks    Status  New        PT Long Term Goals - 05/27/19 1231      PT LONG TERM GOAL #1   Title  decrease pain 50%    Time  8    Period  Weeks    Status  New      PT LONG TERM GOAL #2   Title  understand proper posture and body mechanics    Time  8    Period  Weeks    Status  New      PT LONG TERM GOAL #3   Title  increase lumbar ROM 25%    Time  8    Period  Weeks    Status  New      PT LONG TERM GOAL #4   Title  get up from sitting without any difficulty    Time  8    Period  Weeks    Status  New      PT LONG TERM GOAL #5  Title  walk 2 miles without difficulty    Time  8    Period  Weeks    Status  New             Plan - 05/27/19 1226    Clinical Impression Statement  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  His left low back bothers him with walking.  Both of these the pain can be up to 8/10.  He is tight in the LE mms.    Personal Factors and Comorbidities  Comorbidity 3+    Comorbidities  2 cervical fusions, 4 lumbar surgeries, anxiety and vertigo    Stability/Clinical  Decision Making  Evolving/Moderate complexity    Clinical Decision Making  Moderate    Rehab Potential  Good    PT Frequency  2x / week    PT Duration  8 weeks    PT Treatment/Interventions  ADLs/Self Care Home Management;Cryotherapy;Electrical Stimulation;Moist Heat;Ultrasound;Therapeutic exercise;Therapeutic activities;Iontophoresis 4mg /ml Dexamethasone;Functional mobility training;Patient/family education;Manual techniques;Dry needling    PT Next Visit Plan  work on flexibility and function, he may be out of town for a week or more    Consulted and Agree with Plan of Care  Patient       Patient will benefit from skilled therapeutic intervention in order to improve the following deficits and impairments:  Decreased range of motion, Difficulty walking, Increased muscle spasms, Decreased activity tolerance, Pain, Improper body mechanics, Impaired flexibility, Decreased mobility, Decreased strength, Postural dysfunction  Visit Diagnosis: Acute bilateral low back pain without sciatica - Plan: PT plan of care cert/re-cert  Pain in right hip - Plan: PT plan of care cert/re-cert  Muscle spasm of back - Plan: PT plan of care cert/re-cert     Problem List Patient Active Problem List   Diagnosis Date Noted  . Shoulder arthritis 05/23/2017  . Hereditary and idiopathic peripheral neuropathy 03/24/2015  . 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 Pain of right heel 12/18/2012    Jearld LeschALBRIGHT,Domenick Quebedeaux W., PT 05/27/2019, 12:34 PM  [facility] [address] [place], [place], [address] Phone: [phone]   Fax:  [phone]  Name: Tawanna Solorvin J Apps MRN: [id number] Date of Birth: Feb 02, 1946

## 2019-05-29 ENCOUNTER — Ambulatory Visit: Payer: Medicare Other | Admitting: Physical Therapy

## 2019-05-29 ENCOUNTER — Other Ambulatory Visit: Payer: Self-pay

## 2019-05-29 ENCOUNTER — Encounter: Payer: Self-pay | Admitting: Physical Therapy

## 2019-05-29 DIAGNOSIS — M545 Low back pain, unspecified: Secondary | ICD-10-CM

## 2019-05-29 DIAGNOSIS — M62830 Muscle spasm of back: Secondary | ICD-10-CM

## 2019-05-29 DIAGNOSIS — M25551 Pain in right hip: Secondary | ICD-10-CM

## 2019-05-29 NOTE — Therapy (Signed)
[facility] [address] [place], [place], [address] Phone: [phone]   Fax:  [phone]  Physical Therapy Treatment  Patient Details  Name: George Freeman MRN: [id number] Date of Birth: Mar 15, 1946 Referring Provider (PT): Everlene Other   Encounter Date: 05/29/2019  PT End of Session - 05/29/19 1119    Visit Number  2    Date for PT Re-Evaluation  07/27/19    PT Start Time  0928    PT Stop Time  1014    PT Time Calculation (min)  46 min    Activity Tolerance  Patient tolerated treatment well    Behavior During Therapy  [facility] for tasks assessed/performed       Past Medical History:  Diagnosis Date  . Anxiety   . Arthritis   . BPV (benign positional vertigo)   . Depression   . GERD (gastroesophageal reflux disease)   . Neuromuscular disorder (HCC)   . Panic disorder   . PONV (postoperative nausea and vomiting)    chills and anxiety after surgery    Past Surgical History:  Procedure Laterality Date  . ANAL FISSURE REPAIR  1993  . APPENDECTOMY  1969  . 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 BACK SURGERY  2011   Stenosis, sciatica  . BACK SURGERY  December 2014  . CERVICAL SPINE SURGERY  Feb. 2014  . CERVICAL SPINE SURGERY  07/23/15   fusion  . COLONOSCOPY    . KNEE SURGERY     2x- cartilage  . left foot surgery  1985  . Left shoulder surgery  1990  . Right shoulder surgery  2012  . TOTAL KNEE ARTHROPLASTY  January 2011  . TOTAL KNEE ARTHROPLASTY  Feb. 2012  . TOTAL SHOULDER ARTHROPLASTY Right 05/23/2017   Procedure: RIGHT TOTAL SHOULDER ARTHROPLASTY;  Surgeon: Cammy Copa, MD;  Location: [facility];  Service: Orthopedics;  Laterality: Right;  Marland Kitchen VASECTOMY      There were no vitals filed for this visit.  Subjective Assessment - 05/29/19 0930    Subjective  Patient reports that he has some pain in the groin on the right, always worse with sitting for longer periods    Currently in Pain?  Yes    Pain Score  2     Pain Location  Groin    Pain Orientation  Right                       [facility] Adult PT Treatment/Exercise - 05/29/19 0001      Lumbar Exercises: Stretches   Passive Hamstring Stretch  Right;Left;3 reps;20 seconds    Hip Flexor Stretch  Right;4 reps;20 seconds    Piriformis Stretch  Right;Left;3 reps;20 seconds      Moist Heat Therapy   Number Minutes Moist Heat  12 Minutes    Moist Heat Location  Hip;Lumbar Spine      Electrical Stimulation   Electrical Stimulation Location  tried to use the estim throught the psoas on the right    Electrical Stimulation Action  IFC    Electrical Stimulation Parameters  supine    Electrical Stimulation Goals  Pain      Manual Therapy   Manual Therapy  Myofascial release    Myofascial Release  worked on the psoas mm trying to get deep enough to release this, able to get pretty good at the insertion but more difficulty in the mm belly  but able to tolerate to some extent               PT Short Term Goals - 05/27/19 1230      PT SHORT TERM GOAL #1   Title  independent with initial HEP    Time  2    Period  Weeks    Status  New        PT Long Term Goals - 05/27/19 1231      PT LONG TERM GOAL #1   Title  decrease pain 50%    Time  8    Period  Weeks    Status  New      PT LONG TERM GOAL #2   Title  understand proper posture and body mechanics    Time  8    Period  Weeks    Status  New      PT LONG TERM GOAL #3   Title  increase lumbar ROM 25%    Time  8    Period  Weeks    Status  New      PT LONG TERM GOAL #4   Title  get up from sitting without any difficulty    Time  8    Period  Weeks    Status  New      PT LONG TERM GOAL #5   Title  walk 2 miles without difficulty    Time  8    Period  Weeks    Status  New            Plan - 05/29/19 1119    Clinical Impression Statement  Patient with right groin pain, tried some MFR, very tender and sore with this but this was very  tight.  He will be going on a long drive and we talked about the need to get up and move and for him to continue to do some stretches    PT Next Visit Plan  he will be going on a long drive and out of town for a while    Consulted and Agree with Plan of Care  Patient       Patient will benefit from skilled therapeutic intervention in order to improve the following deficits and impairments:  Decreased range of motion, Difficulty walking, Increased muscle spasms, Decreased activity tolerance, Pain, Improper body mechanics, Impaired flexibility, Decreased mobility, Decreased strength, Postural dysfunction  Visit Diagnosis: Pain in right hip  Acute bilateral low back pain without sciatica  Muscle spasm of back     Problem List Patient Active Problem List   Diagnosis Date Noted  . Shoulder arthritis 05/23/2017  . Hereditary and idiopathic peripheral neuropathy 03/24/2015  . 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 Pain of right heel 12/18/2012    Jearld LeschALBRIGHT,Jamie Hafford W., PT 05/29/2019, 11:24 AM  [facility] [address] [place], [place], [address] Phone: [phone]   Fax:  [phone]  Name: George Freeman MRN: [id number] Date of Birth: [birth year]

## 2019-05-31 ENCOUNTER — Ambulatory Visit: Payer: Medicare Other | Admitting: Physical Therapy

## 2019-06-14 ENCOUNTER — Ambulatory Visit: Payer: Medicare Other | Admitting: Physical Therapy

## 2019-06-14 ENCOUNTER — Encounter: Payer: Self-pay | Admitting: Physical Therapy

## 2019-06-14 ENCOUNTER — Other Ambulatory Visit: Payer: Self-pay

## 2019-06-14 DIAGNOSIS — M545 Low back pain, unspecified: Secondary | ICD-10-CM

## 2019-06-14 DIAGNOSIS — M25551 Pain in right hip: Secondary | ICD-10-CM

## 2019-06-14 DIAGNOSIS — M62830 Muscle spasm of back: Secondary | ICD-10-CM

## 2019-06-14 DIAGNOSIS — R42 Dizziness and giddiness: Secondary | ICD-10-CM

## 2019-06-14 NOTE — Therapy (Signed)
[facility] [address] [place], [place], [address] Phone: [phone]   Fax:  [phone]  Physical Therapy Treatment  Patient Details  Name: George Freeman MRN: [id number] Date of Birth: May 03, 1946 Referring Provider (PT): Bouska   Encounter Date: 06/14/2019  PT End of Session - 06/14/19 1004    Visit Number  3    Date for PT Re-Evaluation  07/27/19    PT Start Time  0925    PT Stop Time  1014    PT Time Calculation (min)  49 min    Activity Tolerance  Patient tolerated treatment well    Behavior During Therapy  [facility] for tasks assessed/performed       Past Medical History:  Diagnosis Date  . Anxiety   . Arthritis   . BPV (benign positional vertigo)   . Depression   . GERD (gastroesophageal reflux disease)   . Neuromuscular disorder (HCC)   . Panic disorder   . PONV (postoperative nausea and vomiting)    chills and anxiety after surgery    Past Surgical History:  Procedure Laterality Date  . ANAL FISSURE REPAIR  1993  . APPENDECTOMY  1969  . 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 BACK SURGERY  2011   Stenosis, sciatica  . BACK SURGERY  December 2014  . CERVICAL SPINE SURGERY  Feb. 2014  . CERVICAL SPINE SURGERY  07/23/15   fusion  . COLONOSCOPY    . KNEE SURGERY     2x- cartilage  . left foot surgery  1985  . Left shoulder surgery  1990  . Right shoulder surgery  2012  . TOTAL KNEE ARTHROPLASTY  January 2011  . TOTAL KNEE ARTHROPLASTY  Feb. 2012  . TOTAL SHOULDER ARTHROPLASTY Right 05/23/2017   Procedure: RIGHT TOTAL SHOULDER ARTHROPLASTY;  Surgeon: Cammy Copaean, Scott Gregory, MD;  [facility];  Service: Orthopedics;  Laterality: Right;  Marland Kitchen. VASECTOMY      There were no vitals filed for this visit.  Subjective Assessment - 06/14/19 0932    Subjective  Patient has been gone for 2 weeks, cleaning and doing a house for a parent that passed away.  Reports that he was very busy.  He reports that  he has been very sore but also c/o some vertigo the past 4 days    Currently in Pain?  Yes    Pain Score  4     Pain Location  Hip    Pain Orientation  Right;Anterior    Aggravating Factors   sitting                       [facility] Adult PT Treatment/Exercise - 06/14/19 0001      Lumbar Exercises: Stretches   Passive Hamstring Stretch  Right;Left;3 reps;20 seconds    Hip Flexor Stretch  Right;4 reps;20 seconds    Piriformis Stretch  Right;Left;3 reps;20 seconds    Other Lumbar Stretch Exercise  adductor stretches      Lumbar Exercises: Aerobic   Nustep  level 5 x 6 minutes      Moist Heat Therapy   Number Minutes Moist Heat  12 Minutes    Moist Heat Location  Hip;Lumbar Spine      Electrical Stimulation   Electrical Stimulation Location  to the right hip flexor area, and the left SI area    Electrical Stimulation Action  premod    Electrical Stimulation  Parameters  long sitting    Electrical Stimulation Goals  Pain      Manual Therapy   Manual Therapy  Myofascial release      Vestibular Treatment/Exercise - 06/14/19 0001      Vestibular Treatment/Exercise   Vestibular Treatment Provided  Canalith Repositioning    Canalith Repositioning  Epley Manuever Right       EPLEY MANUEVER RIGHT   Number of Reps   1    Overall Response  Improved Symptoms              PT Short Term Goals - 06/14/19 1008      PT SHORT TERM GOAL #1   Title  independent with initial HEP    Status  On-going        PT Long Term Goals - 05/27/19 1231      PT LONG TERM GOAL #1   Title  decrease pain 50%    Time  8    Period  Weeks    Status  New      PT LONG TERM GOAL #2   Title  understand proper posture and body mechanics    Time  8    Period  Weeks    Status  New      PT LONG TERM GOAL #3   Title  increase lumbar ROM 25%    Time  8    Period  Weeks    Status  New      PT LONG TERM GOAL #4   Title  get up from sitting without any difficulty    Time  8     Period  Weeks    Status  New      PT LONG TERM GOAL #5   Title  walk 2 miles without difficulty    Time  8    Period  Weeks    Status  New            Plan - 06/14/19 1005    Clinical Impression Statement  Patient comes in with vertigo, he has had this before and we have treated him before with this.  He also is having the right gorin and the left SI pain, he spent the past two weeks doing a clean out of a home and has some increased pain.    PT Next Visit Plan  resume treatment    Consulted and Agree with Plan of Care  Patient       Patient will benefit from skilled therapeutic intervention in order to improve the following deficits and impairments:  Decreased range of motion, Difficulty walking, Increased muscle spasms, Decreased activity tolerance, Pain, Improper body mechanics, Impaired flexibility, Decreased mobility, Decreased strength, Postural dysfunction  Visit Diagnosis: 1. Pain in right hip   2. Acute bilateral low back pain without sciatica   3. Muscle spasm of back   4. Dizziness and giddiness        Problem List Patient Active Problem List   Diagnosis Date Noted  . Shoulder arthritis 05/23/2017  . Hereditary and idiopathic peripheral neuropathy 03/24/2015  . 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 Pain of right heel 12/18/2012    Sumner Boast., PT 06/14/2019, 10:09 AM  [facility] [place], [place], [address] Phone: [phone]   Fax:  [phone]  Name: George Freeman MRN: [id number] Date of Birth: [birth year]

## 2019-06-17 ENCOUNTER — Encounter: Payer: Self-pay | Admitting: Physical Therapy

## 2019-06-17 ENCOUNTER — Ambulatory Visit: Payer: Medicare Other | Admitting: Physical Therapy

## 2019-06-17 ENCOUNTER — Other Ambulatory Visit: Payer: Self-pay

## 2019-06-17 DIAGNOSIS — M545 Low back pain, unspecified: Secondary | ICD-10-CM

## 2019-06-17 DIAGNOSIS — M62830 Muscle spasm of back: Secondary | ICD-10-CM

## 2019-06-17 DIAGNOSIS — M25551 Pain in right hip: Secondary | ICD-10-CM

## 2019-06-17 DIAGNOSIS — R42 Dizziness and giddiness: Secondary | ICD-10-CM

## 2019-06-17 NOTE — Therapy (Signed)
[facility] [place], [place], [address] Phone: [phone]   Fax:  [phone]  Physical Therapy Treatment  Patient Details  Name: George Freeman MRN: [id number] Date of Birth: 1946-03-27 Referring Provider (PT): Bouska   Encounter Date: 06/17/2019  PT End of Session - 06/17/19 1002    Visit Number  4    Date for PT Re-Evaluation  07/27/19    PT Start Time  0925    PT Stop Time  1012    PT Time Calculation (min)  47 min    Activity Tolerance  Patient tolerated treatment well    Behavior During Therapy  [facility] for tasks assessed/performed       Past Medical History:  Diagnosis Date  . Anxiety   . Arthritis   . BPV (benign positional vertigo)   . Depression   . GERD (gastroesophageal reflux disease)   . Neuromuscular disorder ([place])   . Panic disorder   . PONV (postoperative nausea and vomiting)    chills and anxiety after surgery    Past Surgical History:  Procedure Laterality Date  . ANAL FISSURE REPAIR  1993  . APPENDECTOMY  1969  . 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 BACK SURGERY  2011   Stenosis, sciatica  . BACK SURGERY  December 2014  . CERVICAL SPINE SURGERY  Feb. 2014  . CERVICAL SPINE SURGERY  07/23/15   fusion  . COLONOSCOPY    . KNEE SURGERY     2x- cartilage  . left foot surgery  1985  . Left shoulder surgery  1990  . Right shoulder surgery  2012  . TOTAL KNEE ARTHROPLASTY  January 2011  . TOTAL KNEE ARTHROPLASTY  Feb. 2012  . TOTAL SHOULDER ARTHROPLASTY Right 05/23/2017   Procedure: RIGHT TOTAL SHOULDER ARTHROPLASTY;  Surgeon: Meredith Pel, MD;  Location: [place];  Service: Orthopedics;  Laterality: Right;  Marland Kitchen VASECTOMY      There were no vitals filed for this visit.  Subjective Assessment - 06/17/19 0959    Subjective  Patient reports that he did not do well after the [facility].  He reports that he is moving better, walked here today , the more he walks  the more he will have pain in the right SI area    Currently in Pain?  Yes    Pain Score  4     Pain Location  Hip    Pain Orientation  Right;Left    Aggravating Factors   pain right anterior hip with sitting, left posterior with walking                       [facility] Adult PT Treatment/Exercise - 06/17/19 0001      Lumbar Exercises: Stretches   Passive Hamstring Stretch  Right;Left;3 reps;20 seconds    Hip Flexor Stretch  Right;4 reps;20 seconds    Quad Stretch  Right;3 reps;20 seconds    Piriformis Stretch  Right;Left;3 reps;20 seconds    Other Lumbar Stretch Exercise  adductor stretches      Moist Heat Therapy   Number Minutes Moist Heat  15 Minutes    Moist Heat Location  Hip;Lumbar Spine      Electrical Stimulation   Electrical Stimulation Location  to the right hip flexor area, and the left SI area    Electrical Stimulation Action  pre mod    Electrical Stimulation Parameters  sitting    Electrical Stimulation Goals  Pain      Manual Therapy   Manual Therapy  Myofascial release;Soft tissue mobilization    Soft tissue mobilization  tot he left SI and buttock are use of some vibration    Myofascial Release  worked on the psoas mm trying to get deep enough to release this, able to get pretty good at the insertion but more difficulty in the mm belly but able to tolerate to some extent               PT Short Term Goals - 06/14/19 1008      PT SHORT TERM GOAL #1   Title  independent with initial HEP    Status  On-going        PT Long Term Goals - 05/27/19 1231      PT LONG TERM GOAL #1   Title  decrease pain 50%    Time  8    Period  Weeks    Status  New      PT LONG TERM GOAL #2   Title  understand proper posture and body mechanics    Time  8    Period  Weeks    Status  New      PT LONG TERM GOAL #3   Title  increase lumbar ROM 25%    Time  8    Period  Weeks    Status  New      PT LONG TERM GOAL #4   Title  get up from sitting  without any difficulty    Time  8    Period  Weeks    Status  New      PT LONG TERM GOAL #5   Title  walk 2 miles without difficulty    Time  8    Period  Weeks    Status  New            Plan - 06/17/19 1002    Clinical Impression Statement  Patient with no dizziness today.  The right psoas is less tender, the left SI and buttock with some increased tenderness but he did walk here.  The left LE is tighter posterior and the right is tighter anterior    PT Next Visit Plan  stretches and pelvic mobility       Patient will benefit from skilled therapeutic intervention in order to improve the following deficits and impairments:  Decreased range of motion, Difficulty walking, Increased muscle spasms, Decreased activity tolerance, Pain, Improper body mechanics, Impaired flexibility, Decreased mobility, Decreased strength, Postural dysfunction  Visit Diagnosis: 1. Pain in right hip   2. Acute bilateral low back pain without sciatica   3. Muscle spasm of back   4. Dizziness and giddiness        Problem List Patient Active Problem List   Diagnosis Date Noted  . Shoulder arthritis 05/23/2017  . Hereditary and idiopathic peripheral neuropathy 03/24/2015  . 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 Pain of right heel 12/18/2012    Jearld LeschALBRIGHT,Fuad Forget W., PT 06/17/2019, 10:04 AM  [facility] [address] [place], [place], [address] Phone: [phone]   Fax:  [phone]  Name: George Freeman MRN: [id number] Date of Birth: 19-Apr-1946

## 2019-06-19 ENCOUNTER — Other Ambulatory Visit: Payer: Self-pay

## 2019-06-19 ENCOUNTER — Encounter: Payer: Self-pay | Admitting: Physical Therapy

## 2019-06-19 ENCOUNTER — Ambulatory Visit: Payer: Medicare Other | Admitting: Physical Therapy

## 2019-06-19 DIAGNOSIS — M545 Low back pain, unspecified: Secondary | ICD-10-CM

## 2019-06-19 DIAGNOSIS — R42 Dizziness and giddiness: Secondary | ICD-10-CM

## 2019-06-19 DIAGNOSIS — M62830 Muscle spasm of back: Secondary | ICD-10-CM

## 2019-06-19 DIAGNOSIS — M25551 Pain in right hip: Secondary | ICD-10-CM

## 2019-06-19 NOTE — Therapy (Signed)
[place], [place], [address] Phone: [phone]   Fax:  [phone]  Physical Therapy Treatment  Patient Details  Name: George Freeman MRN: [id number] Date of Birth: 12/06/1946 Referring Provider (PT): Bouska   Encounter Date: 06/19/2019  PT End of Session - 06/19/19 1004    Visit Number  5    Date for PT Re-Evaluation  07/27/19    PT Start Time  0925    PT Stop Time  1015    PT Time Calculation (min)  50 min    Activity Tolerance  Patient tolerated treatment well    Behavior During Therapy  [facility] for tasks assessed/performed       Past Medical History:  Diagnosis Date  . Anxiety   . Arthritis   . BPV (benign positional vertigo)   . Depression   . GERD (gastroesophageal reflux disease)   . Neuromuscular disorder ([place])   . Panic disorder   . PONV (postoperative nausea and vomiting)    chills and anxiety after surgery    Past Surgical History:  Procedure Laterality Date  . ANAL FISSURE REPAIR  1993  . APPENDECTOMY  1969  . 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 BACK SURGERY  2011   Stenosis, sciatica  . BACK SURGERY  December 2014  . CERVICAL SPINE SURGERY  Feb. 2014  . CERVICAL SPINE SURGERY  07/23/15   fusion  . COLONOSCOPY    . KNEE SURGERY     2x- cartilage  . left foot surgery  1985  . Left shoulder surgery  1990  . Right shoulder surgery  2012  . TOTAL KNEE ARTHROPLASTY  January 2011  . TOTAL KNEE ARTHROPLASTY  Feb. 2012  . TOTAL SHOULDER ARTHROPLASTY Right 05/23/2017   Procedure: RIGHT TOTAL SHOULDER ARTHROPLASTY;  Surgeon: Meredith Pel, MD;  Location: [facility];  Service: Orthopedics;  Laterality: Right;  Marland Kitchen VASECTOMY      There were no vitals filed for this visit.  Subjective Assessment - 06/19/19 1003    Subjective  Patient reports that after the last visit when he walked home he felt "really good"    Currently in Pain?  Yes    Pain Score  3     Pain  Location  Hip    Pain Orientation  Right;Left    Pain Relieving Factors  treatment seemed to help                       [facility] Adult PT Treatment/Exercise - 06/19/19 0001      Moist Heat Therapy   Number Minutes Moist Heat  15 Minutes    Moist Heat Location  Hip;Lumbar Spine      Electrical Stimulation   Electrical Stimulation Location  to the right hip flexor area, and the left SI area    Electrical Stimulation Action  pre mod    Electrical Stimulation Parameters  sitting    Electrical Stimulation Goals  Pain      Manual Therapy   Manual Therapy  Myofascial release;Soft tissue mobilization    Manual therapy comments  passive ROM of the LE's and the lumbar spine    Soft tissue mobilization  tot he left SI and buttock are use of some vibration    Myofascial Release  worked on the psoas mm trying to get deep enough to release this, able to get pretty good at the  insertion but more difficulty in the mm belly but able to tolerate to some extent               PT Short Term Goals - 06/14/19 1008      PT SHORT TERM GOAL #1   Title  independent with initial HEP    Status  On-going        PT Long Term Goals - 06/19/19 1006      PT LONG TERM GOAL #1   Title  decrease pain 50%    Status  On-going      PT LONG TERM GOAL #2   Title  understand proper posture and body mechanics    Status  On-going            Plan - 06/19/19 1005    Clinical Impression Statement  Patient continues to report that he feels that the treatment is helping, reports easier walking.  Still issues with sitting    PT Next Visit Plan  stretches and pelvic mobility    Consulted and Agree with Plan of Care  Patient       Patient will benefit from skilled therapeutic intervention in order to improve the following deficits and impairments:  Decreased range of motion, Difficulty walking, Increased muscle spasms, Decreased activity tolerance, Pain, Improper body mechanics, Impaired  flexibility, Decreased mobility, Decreased strength, Postural dysfunction  Visit Diagnosis: 1. Pain in right hip   2. Acute bilateral low back pain without sciatica   3. Muscle spasm of back   4. Dizziness and giddiness        Problem List Patient Active Problem List   Diagnosis Date Noted  . Shoulder arthritis 05/23/2017  . Hereditary and idiopathic peripheral neuropathy 03/24/2015  . 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 Pain of right heel 12/18/2012    Jearld LeschALBRIGHT,Nancyjo Givhan W., PT 06/19/2019, 10:07 AM  [facility] [address] [place], [place], [address] Phone: [phone]   Fax:  [phone]  Name: George Freeman MRN: [id number] Date of Birth: [birth year]

## 2019-06-21 ENCOUNTER — Other Ambulatory Visit: Payer: Self-pay

## 2019-06-21 ENCOUNTER — Encounter: Payer: Self-pay | Admitting: Physical Therapy

## 2019-06-21 ENCOUNTER — Ambulatory Visit: Payer: Medicare Other | Admitting: Physical Therapy

## 2019-06-21 DIAGNOSIS — M545 Low back pain, unspecified: Secondary | ICD-10-CM

## 2019-06-21 DIAGNOSIS — M62830 Muscle spasm of back: Secondary | ICD-10-CM

## 2019-06-21 DIAGNOSIS — R42 Dizziness and giddiness: Secondary | ICD-10-CM

## 2019-06-21 DIAGNOSIS — M25551 Pain in right hip: Secondary | ICD-10-CM

## 2019-06-21 NOTE — Therapy (Signed)
[facility] [place], [place], [address] Phone: [phone]   Fax:  [phone]  Physical Therapy Treatment  Patient Details  Name: George Freeman MRN: [id number] Date of Birth: 12-Sep-1946 Referring Provider (PT): Coletta Memos   Encounter Date: 06/21/2019  PT End of Session - 06/21/19 0959    Visit Number  6    Date for PT Re-Evaluation  07/27/19    PT Start Time  0925    PT Stop Time  1015    PT Time Calculation (min)  50 min    Activity Tolerance  Patient tolerated treatment well    Behavior During Therapy  [facility] for tasks assessed/performed       Past Medical History:  Diagnosis Date  . Anxiety   . Arthritis   . BPV (benign positional vertigo)   . Depression   . GERD (gastroesophageal reflux disease)   . Neuromuscular disorder ([place])   . Panic disorder   . PONV (postoperative nausea and vomiting)    chills and anxiety after surgery    Past Surgical History:  Procedure Laterality Date  . ANAL FISSURE REPAIR  1993  . APPENDECTOMY  1969  . 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 BACK SURGERY  2011   Stenosis, sciatica  . BACK SURGERY  December 2014  . CERVICAL SPINE SURGERY  Feb. 2014  . CERVICAL SPINE SURGERY  07/23/15   fusion  . COLONOSCOPY    . KNEE SURGERY     2x- cartilage  . left foot surgery  1985  . Left shoulder surgery  1990  . Right shoulder surgery  2012  . TOTAL KNEE ARTHROPLASTY  January 2011  . TOTAL KNEE ARTHROPLASTY  Feb. 2012  . TOTAL SHOULDER ARTHROPLASTY Right 05/23/2017   Procedure: RIGHT TOTAL SHOULDER ARTHROPLASTY;  Surgeon: Meredith Pel, MD;  Location: [facility];  Service: Orthopedics;  Laterality: Right;  Marland Kitchen VASECTOMY      There were no vitals filed for this visit.  Subjective Assessment - 06/21/19 0932    Subjective  Patietn report sthat the dizziness may be a side effect of a medication, reports putting shoes and socks on easier, still some pain after  sitting or walking    Currently in Pain?  Yes    Pain Score  3     Pain Location  Hip                       [facility] Adult PT Treatment/Exercise - 06/21/19 0001      Moist Heat Therapy   Number Minutes Moist Heat  15 Minutes    Moist Heat Location  Hip;Lumbar Spine      Electrical Stimulation   Electrical Stimulation Location  to the right hip flexor area, and the left SI area    Electrical Stimulation Action  pre mod    Electrical Stimulation Parameters  sitting    Electrical Stimulation Goals  Pain      Manual Therapy   Manual Therapy  Myofascial release;Soft tissue mobilization    Manual therapy comments  passive ROM of the LE's and the lumbar spine    Soft tissue mobilization  tot he left SI and buttock are use of some vibration    Myofascial Release  worked on the psoas mm trying to get deep enough to release this, able to get pretty good at the insertion but more difficulty in the  mm belly but able to tolerate to some extent               PT Short Term Goals - 06/14/19 1008      PT SHORT TERM GOAL #1   Title  independent with initial HEP    Status  On-going        PT Long Term Goals - 06/21/19 1001      PT LONG TERM GOAL #1   Title  decrease pain 50%    Status  Partially Met      PT LONG TERM GOAL #2   Title  understand proper posture and body mechanics    Status  Partially Met      PT LONG TERM GOAL #3   Title  increase lumbar ROM 25%    Status  Partially Met      PT LONG TERM GOAL #4   Title  get up from sitting without any difficulty    Status  On-going      PT LONG TERM GOAL #5   Title  walk 2 miles without difficulty    Status  On-going            Plan - 06/21/19 1000    Clinical Impression Statement  Patient with a reason for the new dizziness possible medicaiton side effect, he reports that he is putting shoes and socks on easier and with less pain, still some pain in the right groin after sitting longer periods, and  some left SI area pain with walking    PT Next Visit Plan  stretches and pelvic mobility    Consulted and Agree with Plan of Care  Patient       Patient will benefit from skilled therapeutic intervention in order to improve the following deficits and impairments:  Decreased range of motion, Difficulty walking, Increased muscle spasms, Decreased activity tolerance, Pain, Improper body mechanics, Impaired flexibility, Decreased mobility, Decreased strength, Postural dysfunction  Visit Diagnosis: 1. Pain in right hip   2. Acute bilateral low back pain without sciatica   3. Muscle spasm of back   4. Dizziness and giddiness        Problem List Patient Active Problem List   Diagnosis Date Noted  . Shoulder arthritis 05/23/2017  . Hereditary and idiopathic peripheral neuropathy 03/24/2015  . 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 Pain of right heel 12/18/2012    Sumner Boast., PT 06/21/2019, 10:02 AM  [place], [place], [address] Phone: [phone]   Fax:  [phone]  Name: George Freeman MRN: [id number] Date of Birth: 01/11/1946

## 2019-06-24 ENCOUNTER — Encounter: Payer: Self-pay | Admitting: Physical Therapy

## 2019-06-24 ENCOUNTER — Other Ambulatory Visit: Payer: Self-pay

## 2019-06-24 ENCOUNTER — Ambulatory Visit: Payer: Medicare Other | Admitting: Physical Therapy

## 2019-06-24 DIAGNOSIS — M545 Low back pain, unspecified: Secondary | ICD-10-CM

## 2019-06-24 DIAGNOSIS — M62830 Muscle spasm of back: Secondary | ICD-10-CM

## 2019-06-24 DIAGNOSIS — R42 Dizziness and giddiness: Secondary | ICD-10-CM

## 2019-06-24 DIAGNOSIS — M25551 Pain in right hip: Secondary | ICD-10-CM

## 2019-06-24 NOTE — Therapy (Signed)
[facility] [place], [place], [address] Phone: [phone]   Fax:  [phone]  Physical Therapy Treatment  Patient Details  Name: George Freeman MRN: [id number] Date of Birth: 04-01-1946 Referring Provider (PT): Coletta Memos   Encounter Date: 06/24/2019  PT End of Session - 06/24/19 1001    Visit Number  7    Date for PT Re-Evaluation  07/27/19    PT Start Time  0924    PT Stop Time  1015    PT Time Calculation (min)  51 min    Activity Tolerance  Patient tolerated treatment well    Behavior During Therapy  [facility] for tasks assessed/performed       Past Medical History:  Diagnosis Date  . Anxiety   . Arthritis   . BPV (benign positional vertigo)   . Depression   . GERD (gastroesophageal reflux disease)   . Neuromuscular disorder ([place])   . Panic disorder   . PONV (postoperative nausea and vomiting)    chills and anxiety after surgery    Past Surgical History:  Procedure Laterality Date  . ANAL FISSURE REPAIR  1993  . APPENDECTOMY  1969  . 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 BACK SURGERY  2011   Stenosis, sciatica  . BACK SURGERY  December 2014  . CERVICAL SPINE SURGERY  Feb. 2014  . CERVICAL SPINE SURGERY  07/23/15   fusion  . COLONOSCOPY    . KNEE SURGERY     2x- cartilage  . left foot surgery  1985  . Left shoulder surgery  1990  . Right shoulder surgery  2012  . TOTAL KNEE ARTHROPLASTY  January 2011  . TOTAL KNEE ARTHROPLASTY  Feb. 2012  . TOTAL SHOULDER ARTHROPLASTY Right 05/23/2017   Procedure: RIGHT TOTAL SHOULDER ARTHROPLASTY;  Surgeon: Meredith Pel, MD;  Location: [facility];  Service: Orthopedics;  Laterality: Right;  Marland Kitchen VASECTOMY      There were no vitals filed for this visit.  Subjective Assessment - 06/24/19 0959    Subjective  Patient reports that he feels tha thte groin and the back are improving, reports easier turning over in bed and with other movements    Currently in Pain?  Yes    Pain Score  3     Pain Location  Hip    Pain Orientation  Right;Left    Pain Relieving Factors  treatment is helping                       [facility] Adult PT Treatment/Exercise - 06/24/19 0001      Moist Heat Therapy   Number Minutes Moist Heat  15 Minutes    Moist Heat Location  Hip;Lumbar Spine      Electrical Stimulation   Electrical Stimulation Location  to the right hip flexor area, and the left SI area    Electrical Stimulation Action  pre mod    Electrical Stimulation Parameters  sitting    Electrical Stimulation Goals  Pain      Manual Therapy   Manual Therapy  Myofascial release;Soft tissue mobilization    Manual therapy comments  passive ROM of the LE's and the lumbar spine    Soft tissue mobilization  tot he left SI and buttock are use of some vibration    Myofascial Release  worked on the psoas mm trying to get deep enough to release this, able  to get pretty good at the insertion but more difficulty in the mm belly but able to tolerate to some extent               PT Short Term Goals - 06/14/19 1008      PT SHORT TERM GOAL #1   Title  independent with initial HEP    Status  On-going        PT Long Term Goals - 06/21/19 1001      PT LONG TERM GOAL #1   Title  decrease pain 50%    Status  Partially Met      PT LONG TERM GOAL #2   Title  understand proper posture and body mechanics    Status  Partially Met      PT LONG TERM GOAL #3   Title  increase lumbar ROM 25%    Status  Partially Met      PT LONG TERM GOAL #4   Title  get up from sitting without any difficulty    Status  On-going      PT LONG TERM GOAL #5   Title  walk 2 miles without difficulty    Status  On-going            Plan - 06/24/19 1001    Clinical Impression Statement  Patient is reporting easier movements when getting in and out of car and repositioning in bed.  He reports that he is having less issues after sitting wiht pain in  the groin    PT Next Visit Plan  work on some stability    Consulted and Agree with Plan of Care  Patient       Patient will benefit from skilled therapeutic intervention in order to improve the following deficits and impairments:  Decreased range of motion, Difficulty walking, Increased muscle spasms, Decreased activity tolerance, Pain, Improper body mechanics, Impaired flexibility, Decreased mobility, Decreased strength, Postural dysfunction  Visit Diagnosis: 1. Pain in right hip   2. Acute bilateral low back pain without sciatica   3. Muscle spasm of back   4. Dizziness and giddiness        Problem List Patient Active Problem List   Diagnosis Date Noted  . Shoulder arthritis 05/23/2017  . Hereditary and idiopathic peripheral neuropathy 03/24/2015  . 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 Pain of right heel 12/18/2012    Sumner Boast., PT 06/24/2019, 10:03 AM  [place], [place], [address] Phone: [phone]   Fax:  [phone]  Name: George Freeman MRN: [id number] Date of Birth: May 02, 1946

## 2019-06-26 ENCOUNTER — Other Ambulatory Visit: Payer: Self-pay

## 2019-06-26 ENCOUNTER — Ambulatory Visit: Payer: Medicare Other | Attending: Family Medicine | Admitting: Physical Therapy

## 2019-06-26 ENCOUNTER — Encounter: Payer: Self-pay | Admitting: Physical Therapy

## 2019-06-26 DIAGNOSIS — R42 Dizziness and giddiness: Secondary | ICD-10-CM | POA: Insufficient documentation

## 2019-06-26 DIAGNOSIS — M545 Low back pain, unspecified: Secondary | ICD-10-CM

## 2019-06-26 DIAGNOSIS — M25551 Pain in right hip: Secondary | ICD-10-CM

## 2019-06-26 DIAGNOSIS — M62830 Muscle spasm of back: Secondary | ICD-10-CM | POA: Diagnosis present

## 2019-06-26 NOTE — Therapy (Signed)
[place], [place], [address] Phone: [phone]   Fax:  [phone]  Physical Therapy Treatment  Patient Details  Name: George Freeman MRN: [id number] Date of Birth: [birth year] Referring Provider (PT): Coletta Memos   Encounter Date: 06/26/2019  PT End of Session - 06/26/19 1010    Visit Number  8    Date for PT Re-Evaluation  07/27/19    PT Start Time  0927    PT Stop Time  1017    PT Time Calculation (min)  50 min    Activity Tolerance  Patient tolerated treatment well    Behavior During Therapy  [facility] for tasks assessed/performed       Past Medical History:  Diagnosis Date  . Anxiety   . Arthritis   . BPV (benign positional vertigo)   . Depression   . GERD (gastroesophageal reflux disease)   . Neuromuscular disorder ([place])   . Panic disorder   . PONV (postoperative nausea and vomiting)    chills and anxiety after surgery    Past Surgical History:  Procedure Laterality Date  . ANAL FISSURE REPAIR  1993  . APPENDECTOMY  1969  . 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 BACK SURGERY  2011   Stenosis, sciatica  . BACK SURGERY  December 2014  . CERVICAL SPINE SURGERY  Feb. 2014  . CERVICAL SPINE SURGERY  07/23/15   fusion  . COLONOSCOPY    . KNEE SURGERY     2x- cartilage  . left foot surgery  1985  . Left shoulder surgery  1990  . Right shoulder surgery  2012  . TOTAL KNEE ARTHROPLASTY  January 2011  . TOTAL KNEE ARTHROPLASTY  Feb. 2012  . TOTAL SHOULDER ARTHROPLASTY Right 05/23/2017   Procedure: RIGHT TOTAL SHOULDER ARTHROPLASTY;  Surgeon: Meredith Pel, MD;  Location: [facility];  Service: Orthopedics;  Laterality: Right;  Marland Kitchen VASECTOMY      There were no vitals filed for this visit.  Subjective Assessment - 06/26/19 1007    Subjective  Patient reports that his groin is feeling much better, reports still difficulty with the left low back area    Currently in Pain?  Yes    Pain Score  4     Pain Location  Back    Pain Orientation  Left;Lower    Aggravating Factors   walking increases the left low back    Pain Relieving Factors  reports treatment helps the left low back but it returns                       [facility] Adult PT Treatment/Exercise - 06/26/19 0001      Lumbar Exercises: Stretches   Passive Hamstring Stretch  Right;Left;3 reps;20 seconds    Hip Flexor Stretch  Right;4 reps;20 seconds    Quad Stretch  Right;3 reps;20 seconds    Piriformis Stretch  Right;Left;3 reps;20 seconds    Other Lumbar Stretch Exercise  adductor stretches      Moist Heat Therapy   Number Minutes Moist Heat  15 Minutes    Moist Heat Location  Hip;Lumbar Spine      Electrical Stimulation   Electrical Stimulation Location  to the right hip flexor area, and the left SI area    Electrical Stimulation Action  premod    Electrical Stimulation Parameters  sitting    Electrical Stimulation Goals  Pain  Manual Therapy   Manual Therapy  Myofascial release;Soft tissue mobilization    Manual therapy comments  passive ROM of the LE's and the lumbar spine    Soft tissue mobilization  tot he left SI and buttock are use of some vibration    Myofascial Release  worked on the psoas mm trying to get deep enough to release this, able to get pretty good at the insertion but more difficulty in the mm belly but able to tolerate to some extent       Trigger Point Dry Needling - 06/26/19 0001    Consent Given?  Yes    Education Handout Provided  Yes    Muscles Treated Back/Hip  Gluteus medius;Gluteus maximus    Gluteus Medius Response  Palpable increased muscle length    Gluteus Maximus Response  Palpable increased muscle length             PT Short Term Goals - 06/14/19 1008      PT SHORT TERM GOAL #1   Title  independent with initial HEP    Status  On-going        PT Long Term Goals - 06/21/19 1001      PT LONG TERM GOAL #1   Title  decrease pain 50%     Status  Partially Met      PT LONG TERM GOAL #2   Title  understand proper posture and body mechanics    Status  Partially Met      PT LONG TERM GOAL #3   Title  increase lumbar ROM 25%    Status  Partially Met      PT LONG TERM GOAL #4   Title  get up from sitting without any difficulty    Status  On-going      PT LONG TERM GOAL #5   Title  walk 2 miles without difficulty    Status  On-going            Plan - 06/26/19 1010    Clinical Impression Statement  Patient feels like the groin is better, still with left low back pain with walking, he is tight here and I added some DN.  He is still tight in the LE's    PT Next Visit Plan  work on some stability    Consulted and Agree with Plan of Care  Patient       Patient will benefit from skilled therapeutic intervention in order to improve the following deficits and impairments:  Decreased range of motion, Difficulty walking, Increased muscle spasms, Decreased activity tolerance, Pain, Improper body mechanics, Impaired flexibility, Decreased mobility, Decreased strength, Postural dysfunction  Visit Diagnosis: 1. Pain in right hip   2. Acute bilateral low back pain without sciatica   3. Muscle spasm of back        Problem List Patient Active Problem List   Diagnosis Date Noted  . Shoulder arthritis 05/23/2017  . Hereditary and idiopathic peripheral neuropathy 03/24/2015  . 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 Pain of right heel 12/18/2012    Sumner Boast., PT 06/26/2019, 10:54 AM  [place], [place], [address] Phone: [phone]   Fax:  [phone]  Name: George Freeman MRN: [id number] Date of Birth: March 21, 1946

## 2019-06-28 ENCOUNTER — Encounter: Payer: Self-pay | Admitting: Physical Therapy

## 2019-06-28 ENCOUNTER — Ambulatory Visit: Payer: Medicare Other | Admitting: Physical Therapy

## 2019-06-28 ENCOUNTER — Other Ambulatory Visit: Payer: Self-pay

## 2019-06-28 DIAGNOSIS — M25551 Pain in right hip: Secondary | ICD-10-CM

## 2019-06-28 DIAGNOSIS — M545 Low back pain, unspecified: Secondary | ICD-10-CM

## 2019-06-28 DIAGNOSIS — R42 Dizziness and giddiness: Secondary | ICD-10-CM

## 2019-06-28 DIAGNOSIS — M62830 Muscle spasm of back: Secondary | ICD-10-CM

## 2019-06-28 NOTE — Therapy (Signed)
[place], [place], [address] Phone: [phone]   Fax:  [phone]  Physical Therapy Treatment  Patient Details  Name: George Freeman MRN: [id number] Date of Birth: [birth year] Referring Provider (PT): Coletta Memos   Encounter Date: 06/28/2019  PT End of Session - 06/28/19 1124    Visit Number  9    Date for PT Re-Evaluation  07/27/19    PT Start Time  0926    PT Stop Time  1016    PT Time Calculation (min)  50 min    Activity Tolerance  Patient tolerated treatment well    Behavior During Therapy  [facility] for tasks assessed/performed       Past Medical History:  Diagnosis Date  . Anxiety   . Arthritis   . BPV (benign positional vertigo)   . Depression   . GERD (gastroesophageal reflux disease)   . Neuromuscular disorder ([place])   . Panic disorder   . PONV (postoperative nausea and vomiting)    chills and anxiety after surgery    Past Surgical History:  Procedure Laterality Date  . ANAL FISSURE REPAIR  1993  . APPENDECTOMY  1969  . 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 BACK SURGERY  2011   Stenosis, sciatica  . BACK SURGERY  December 2014  . CERVICAL SPINE SURGERY  Feb. 2014  . CERVICAL SPINE SURGERY  07/23/15   fusion  . COLONOSCOPY    . KNEE SURGERY     2x- cartilage  . left foot surgery  1985  . Left shoulder surgery  1990  . Right shoulder surgery  2012  . TOTAL KNEE ARTHROPLASTY  January 2011  . TOTAL KNEE ARTHROPLASTY  Feb. 2012  . TOTAL SHOULDER ARTHROPLASTY Right 05/23/2017   Procedure: RIGHT TOTAL SHOULDER ARTHROPLASTY;  Surgeon: Meredith Pel, MD;  Location: [facility];  Service: Orthopedics;  Laterality: Right;  Marland Kitchen VASECTOMY      There were no vitals filed for this visit.  Subjective Assessment - 06/28/19 1118    Subjective  Again reporting that the right hip is getting better, still with left posterior hip issues with walking    Currently in Pain?  Yes    Pain  Score  3     Pain Location  Back    Pain Orientation  Left;Lower                       [facility] Adult PT Treatment/Exercise - 06/28/19 0001      Lumbar Exercises: Stretches   Passive Hamstring Stretch  Right;Left;3 reps;20 seconds    Hip Flexor Stretch  Right;4 reps;20 seconds    Quad Stretch  Right;3 reps;20 seconds    Piriformis Stretch  Right;Left;3 reps;20 seconds    Other Lumbar Stretch Exercise  adductor stretches, ITB stretches bilaterally      Moist Heat Therapy   Number Minutes Moist Heat  15 Minutes    Moist Heat Location  Hip;Lumbar Spine      Electrical Stimulation   Electrical Stimulation Location  to the right hip flexor area, and the left SI area    Electrical Stimulation Action  premod    Electrical Stimulation Parameters  sitting    Electrical Stimulation Goals  Pain      Manual Therapy   Manual Therapy  Myofascial release;Soft tissue mobilization    Manual therapy comments  passive ROM of the LE's  and the lumbar spine    Soft tissue mobilization  tot he left SI and buttock are use of some vibration    Myofascial Release  worked on the psoas mm trying to get deep enough to release this, able to get pretty good at the insertion but more difficulty in the mm belly but able to tolerate to some extent               PT Short Term Goals - 06/14/19 1008      PT SHORT TERM GOAL #1   Title  independent with initial HEP    Status  On-going        PT Long Term Goals - 06/28/19 1126      PT LONG TERM GOAL #1   Title  decrease pain 50%    Status  Partially Met      PT LONG TERM GOAL #2   Title  understand proper posture and body mechanics    Status  Achieved            Plan - 06/28/19 1125    Clinical Impression Statement  Still a very tender area in the left buttock and SI area, he is less tender with less rigidity in the right psoas area.  Moving better with supine to sit and from sit to stand, less effort    PT Next Visit Plan   continue with the plan       Patient will benefit from skilled therapeutic intervention in order to improve the following deficits and impairments:  Decreased range of motion, Difficulty walking, Increased muscle spasms, Decreased activity tolerance, Pain, Improper body mechanics, Impaired flexibility, Decreased mobility, Decreased strength, Postural dysfunction  Visit Diagnosis: 1. Pain in right hip   2. Acute bilateral low back pain without sciatica   3. Muscle spasm of back   4. Dizziness and giddiness        Problem List Patient Active Problem List   Diagnosis Date Noted  . Shoulder arthritis 05/23/2017  . Hereditary and idiopathic peripheral neuropathy 03/24/2015  . 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 Pain of right heel 12/18/2012    Sumner Boast., PT 06/28/2019, 11:27 AM  [place], [place], [address] Phone: [phone]   Fax:  [phone]  Name: George Freeman MRN: [id number] Date of Birth: 09/14/1946

## 2019-07-01 ENCOUNTER — Ambulatory Visit: Payer: Medicare Other | Admitting: Physical Therapy

## 2019-07-01 ENCOUNTER — Other Ambulatory Visit: Payer: Self-pay

## 2019-07-01 ENCOUNTER — Encounter: Payer: Self-pay | Admitting: Physical Therapy

## 2019-07-01 DIAGNOSIS — M545 Low back pain, unspecified: Secondary | ICD-10-CM

## 2019-07-01 DIAGNOSIS — M25551 Pain in right hip: Secondary | ICD-10-CM

## 2019-07-01 DIAGNOSIS — M62830 Muscle spasm of back: Secondary | ICD-10-CM

## 2019-07-01 NOTE — Therapy (Signed)
[address] [place], [place], [address] Phone: [phone]   Fax:  [phone] Progress Note Reporting Period 05/27/19 to 07/01/19 for the first 10 visits See note below for Objective Data and Assessment of Progress/Goals.       Physical Therapy Treatment  Patient Details  Name: George Freeman MRN: [id number] Date of Birth: 1946/12/01 Referring Provider (PT): Coletta Memos   Encounter Date: 07/01/2019  PT End of Session - 07/01/19 1011    Visit Number  10    Date for PT Re-Evaluation  07/27/19    PT Start Time  0927    PT Stop Time  1017    PT Time Calculation (min)  50 min    Activity Tolerance  Patient tolerated treatment well    Behavior During Therapy  [facility] for tasks assessed/performed       Past Medical History:  Diagnosis Date  . Anxiety   . Arthritis   . BPV (benign positional vertigo)   . Depression   . GERD (gastroesophageal reflux disease)   . Neuromuscular disorder ([place])   . Panic disorder   . PONV (postoperative nausea and vomiting)    chills and anxiety after surgery    Past Surgical History:  Procedure Laterality Date  . ANAL FISSURE REPAIR  1993  . APPENDECTOMY  1969  . 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 BACK SURGERY  2011   Stenosis, sciatica  . BACK SURGERY  December 2014  . CERVICAL SPINE SURGERY  Feb. 2014  . CERVICAL SPINE SURGERY  07/23/15   fusion  . COLONOSCOPY    . KNEE SURGERY     2x- cartilage  . left foot surgery  1985  . Left shoulder surgery  1990  . Right shoulder surgery  2012  . TOTAL KNEE ARTHROPLASTY  January 2011  . TOTAL KNEE ARTHROPLASTY  Feb. 2012  . TOTAL SHOULDER ARTHROPLASTY Right 05/23/2017   Procedure: RIGHT TOTAL SHOULDER ARTHROPLASTY;  Surgeon: Meredith Pel, MD;  Location: [facility];  Service: Orthopedics;  Laterality: Right;  Marland Kitchen VASECTOMY      There were no vitals filed for this visit.  Subjective Assessment - 07/01/19 0929    Subjective  Patient reports that he walked here wihtout pain in the back today    Currently in Pain?  Yes    Pain Score  1     Pain Location  Hip    Pain Orientation  Right                       [facility] Adult PT Treatment/Exercise - 07/01/19 0001      Lumbar Exercises: Stretches   Passive Hamstring Stretch  Right;Left;3 reps;20 seconds    Hip Flexor Stretch  Right;4 reps;20 seconds    Quad Stretch  Right;3 reps;20 seconds    Piriformis Stretch  Right;Left;3 reps;20 seconds    Other Lumbar Stretch Exercise  adductor stretches, ITB stretches bilaterally      Moist Heat Therapy   Number Minutes Moist Heat  15 Minutes    Moist Heat Location  Hip;Lumbar Spine      Electrical Stimulation   Electrical Stimulation Location  to the right hip flexor area, and the left SI area    Electrical Stimulation Action  pre mod    Electrical Stimulation Parameters  sitting    Electrical Stimulation Goals  Pain      Manual  Therapy   Manual Therapy  Myofascial release;Soft tissue mobilization    Manual therapy comments  passive ROM of the LE's and the lumbar spine    Soft tissue mobilization  tot he left SI and buttock are use of some vibration    Myofascial Release  worked on the psoas mm trying to get deep enough to release this, able to get pretty good at the insertion but more difficulty in the mm belly but able to tolerate to some extent       Trigger Point Dry Needling - 07/01/19 0001    Consent Given?  Yes    Muscles Treated Back/Hip  Gluteus medius;Gluteus maximus    Gluteus Medius Response  Palpable increased muscle length    Gluteus Maximus Response  Palpable increased muscle length             PT Short Term Goals - 06/14/19 1008      PT SHORT TERM GOAL #1   Title  independent with initial HEP    Status  On-going        PT Long Term Goals - 06/28/19 1126      PT LONG TERM GOAL #1   Title  decrease pain 50%    Status  Partially Met      PT LONG TERM GOAL  #2   Title  understand proper posture and body mechanics    Status  Achieved            Plan - 07/01/19 1011    Clinical Impression Statement  Patient doing better overall and reports that he was able to walk more today without pain in the posterior area.  His ROM also is seeming better    PT Next Visit Plan  he will be on vacation next week, work on his pain    Consulted and Agree with Plan of Care  Patient       Patient will benefit from skilled therapeutic intervention in order to improve the following deficits and impairments:  Decreased range of motion, Difficulty walking, Increased muscle spasms, Decreased activity tolerance, Pain, Improper body mechanics, Impaired flexibility, Decreased mobility, Decreased strength, Postural dysfunction  Visit Diagnosis: 1. Pain in right hip   2. Acute bilateral low back pain without sciatica   3. Muscle spasm of back        Problem List Patient Active Problem List   Diagnosis Date Noted  . Shoulder arthritis 05/23/2017  . Hereditary and idiopathic peripheral neuropathy 03/24/2015  . 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 Pain of right heel 12/18/2012    Sumner Boast., PT 07/01/2019, 10:14 AM  [address], [place], [address] Phone: [phone]   Fax:  [phone]  Name: George Freeman MRN: [id number] Date of Birth: [birth year]

## 2019-07-03 ENCOUNTER — Encounter: Payer: Self-pay | Admitting: Physical Therapy

## 2019-07-03 ENCOUNTER — Other Ambulatory Visit: Payer: Self-pay

## 2019-07-03 ENCOUNTER — Ambulatory Visit: Payer: Medicare Other | Admitting: Physical Therapy

## 2019-07-03 DIAGNOSIS — M62830 Muscle spasm of back: Secondary | ICD-10-CM

## 2019-07-03 DIAGNOSIS — M25551 Pain in right hip: Secondary | ICD-10-CM | POA: Diagnosis not present

## 2019-07-03 DIAGNOSIS — M545 Low back pain, unspecified: Secondary | ICD-10-CM

## 2019-07-03 NOTE — Therapy (Signed)
[place], [place], [address] Phone: [phone]   Fax:  [phone]  Physical Therapy Treatment  Patient Details  Name: George Freeman MRN: [id number] Date of Birth: 09/18/1946 Referring Provider (PT): Coletta Memos   Encounter Date: 07/03/2019  PT End of Session - 07/03/19 1601    Visit Number  11    Date for PT Re-Evaluation  07/27/19    PT Start Time  1440    PT Stop Time  1525    PT Time Calculation (min)  45 min    Activity Tolerance  Patient tolerated treatment well       Past Medical History:  Diagnosis Date  . Anxiety   . Arthritis   . BPV (benign positional vertigo)   . Depression   . GERD (gastroesophageal reflux disease)   . Neuromuscular disorder ([place])   . Panic disorder   . PONV (postoperative nausea and vomiting)    chills and anxiety after surgery    Past Surgical History:  Procedure Laterality Date  . ANAL FISSURE REPAIR  1993  . APPENDECTOMY  1969  . 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 BACK SURGERY  2011   Stenosis, sciatica  . BACK SURGERY  December 2014  . CERVICAL SPINE SURGERY  Feb. 2014  . CERVICAL SPINE SURGERY  07/23/15   fusion  . COLONOSCOPY    . KNEE SURGERY     2x- cartilage  . left foot surgery  1985  . Left shoulder surgery  1990  . Right shoulder surgery  2012  . TOTAL KNEE ARTHROPLASTY  January 2011  . TOTAL KNEE ARTHROPLASTY  Feb. 2012  . TOTAL SHOULDER ARTHROPLASTY Right 05/23/2017   Procedure: RIGHT TOTAL SHOULDER ARTHROPLASTY;  Surgeon: Meredith Pel, MD;  Location: [facility];  Service: Orthopedics;  Laterality: Right;  Marland Kitchen VASECTOMY      There were no vitals filed for this visit.  Subjective Assessment - 07/03/19 1442    Subjective  Patein saw the MD, wants [place] to continue, he may need injection in the future    Currently in Pain?  Yes    Pain Score  2     Pain Location  Hip    Pain Orientation  Right;Left    Pain Relieving  Factors  "I think we are on to something"                       [facility] Adult PT Treatment/Exercise - 07/03/19 0001      Lumbar Exercises: Stretches   Passive Hamstring Stretch  Right;Left;3 reps;20 seconds    Hip Flexor Stretch  Right;4 reps;20 seconds    Quad Stretch  Right;3 reps;20 seconds    Piriformis Stretch  Right;Left;3 reps;20 seconds    Other Lumbar Stretch Exercise  adductor stretches, ITB stretches bilaterally      Lumbar Exercises: Aerobic   Nustep  level 5 x 6 minutes      Moist Heat Therapy   Number Minutes Moist Heat  15 Minutes    Moist Heat Location  Hip;Lumbar Spine      Electrical Stimulation   Electrical Stimulation Location  to the right hip flexor area, and the left SI area    Electrical Stimulation Action  premod    Electrical Stimulation Parameters  sitting    Electrical Stimulation Goals  Pain      Manual Therapy   Manual  Therapy  Myofascial release;Soft tissue mobilization    Manual therapy comments  passive ROM of the LE's and the lumbar spine    Soft tissue mobilization  tot he left SI and buttock are use of some vibration    Myofascial Release  worked on the psoas mm trying to get deep enough to release this, able to get pretty good at the insertion but more difficulty in the mm belly but able to tolerate to some extent               PT Short Term Goals - 06/14/19 1008      PT SHORT TERM GOAL #1   Title  independent with initial HEP    Status  On-going        PT Long Term Goals - 06/28/19 1126      PT LONG TERM GOAL #1   Title  decrease pain 50%    Status  Partially Met      PT LONG TERM GOAL #2   Title  understand proper posture and body mechanics    Status  Achieved            Plan - 07/03/19 1602    Clinical Impression Statement  Patoient continues to do better, walking better, MD felt like what we are doing is good.  LEss difficulty getting up from sitting.    PT Next Visit Plan  he will be on  vacation next week, work on his pain    Consulted and Agree with Plan of Care  Patient       Patient will benefit from skilled therapeutic intervention in order to improve the following deficits and impairments:  Decreased range of motion, Difficulty walking, Increased muscle spasms, Decreased activity tolerance, Pain, Improper body mechanics, Impaired flexibility, Decreased mobility, Decreased strength, Postural dysfunction  Visit Diagnosis: 1. Pain in right hip   2. Acute bilateral low back pain without sciatica   3. Muscle spasm of back        Problem List Patient Active Problem List   Diagnosis Date Noted  . Shoulder arthritis 05/23/2017  . Hereditary and idiopathic peripheral neuropathy 03/24/2015  . 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 Pain of right heel 12/18/2012    Sumner Boast., PT 07/03/2019, 4:03 PM  [place], [place], [address] Phone: [phone]   Fax:  [phone]  Name: George Freeman MRN: [id number] Date of Birth: 03-03-1946

## 2019-07-05 ENCOUNTER — Other Ambulatory Visit: Payer: Self-pay

## 2019-07-05 ENCOUNTER — Encounter: Payer: Self-pay | Admitting: Physical Therapy

## 2019-07-05 ENCOUNTER — Ambulatory Visit: Payer: Medicare Other | Admitting: Physical Therapy

## 2019-07-05 DIAGNOSIS — M545 Low back pain, unspecified: Secondary | ICD-10-CM

## 2019-07-05 DIAGNOSIS — M25551 Pain in right hip: Secondary | ICD-10-CM | POA: Diagnosis not present

## 2019-07-05 DIAGNOSIS — M62830 Muscle spasm of back: Secondary | ICD-10-CM

## 2019-07-05 NOTE — Therapy (Signed)
[place] [place], [place], [address] Phone: [phone]   Fax:  [phone]  Physical Therapy Treatment  Patient Details  Name: George Freeman MRN: [id number] Date of Birth: August 05, 1946 Referring Provider (PT): Coletta Memos   Encounter Date: 07/05/2019  PT End of Session - 07/05/19 1008    Visit Number  12    Date for PT Re-Evaluation  07/27/19    PT Start Time  0925    PT Stop Time  1014    PT Time Calculation (min)  49 min    Activity Tolerance  Patient tolerated treatment well    Behavior During Therapy  [facility] for tasks assessed/performed       Past Medical History:  Diagnosis Date  . Anxiety   . Arthritis   . BPV (benign positional vertigo)   . Depression   . GERD (gastroesophageal reflux disease)   . Neuromuscular disorder ([place])   . Panic disorder   . PONV (postoperative nausea and vomiting)    chills and anxiety after surgery    Past Surgical History:  Procedure Laterality Date  . ANAL FISSURE REPAIR  1993  . APPENDECTOMY  1969  . 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 BACK SURGERY  2011   Stenosis, sciatica  . BACK SURGERY  December 2014  . CERVICAL SPINE SURGERY  Feb. 2014  . CERVICAL SPINE SURGERY  07/23/15   fusion  . COLONOSCOPY    . KNEE SURGERY     2x- cartilage  . left foot surgery  1985  . Left shoulder surgery  1990  . Right shoulder surgery  2012  . TOTAL KNEE ARTHROPLASTY  January 2011  . TOTAL KNEE ARTHROPLASTY  Feb. 2012  . TOTAL SHOULDER ARTHROPLASTY Right 05/23/2017   Procedure: RIGHT TOTAL SHOULDER ARTHROPLASTY;  Surgeon: Meredith Pel, MD;  Location: [facility];  Service: Orthopedics;  Laterality: Right;  Marland Kitchen VASECTOMY      There were no vitals filed for this visit.  Subjective Assessment - 07/05/19 1006    Subjective  Patient reports that he walked here today and tried to go at a faster pace, reports that he had pain start at about half way but did not  worsen, he reports that it is not as tender    Currently in Pain?  Yes    Pain Score  3     Pain Location  Hip    Aggravating Factors   walking increases the left low back, sitting increases the left anterior hip                       [facility] Adult PT Treatment/Exercise - 07/05/19 0001      Moist Heat Therapy   Number Minutes Moist Heat  15 Minutes    Moist Heat Location  Hip;Lumbar Spine      Electrical Stimulation   Electrical Stimulation Location  to the right hip flexor area, and the left SI area    Electrical Stimulation Action  premod    Electrical Stimulation Parameters  sitting    Electrical Stimulation Goals  Pain      Manual Therapy   Manual Therapy  Myofascial release;Soft tissue mobilization    Manual therapy comments  passive ROM of the LE's and the lumbar spine    Soft tissue mobilization  tot he left SI and buttock are use of some vibration    Myofascial  Release  worked on the psoas mm trying to get deep enough to release this, able to get pretty good at the insertion but more difficulty in the mm belly but able to tolerate to some extent               PT Short Term Goals - 06/14/19 1008      PT SHORT TERM GOAL #1   Title  independent with initial HEP    Status  On-going        PT Long Term Goals - 07/05/19 1009      PT LONG TERM GOAL #1   Title  decrease pain 50%    Status  Partially Met            Plan - 07/05/19 1009    Clinical Impression Statement  Patient is definitely less tender and sore, he is still tight the SLR is much improved and his gait and posture is improved.    PT Next Visit Plan  he is going to go on vacation next week and is going to try to do some hiking if he can    Consulted and Agree with Plan of Care  Patient       Patient will benefit from skilled therapeutic intervention in order to improve the following deficits and impairments:  Decreased range of motion, Difficulty walking, Increased muscle  spasms, Decreased activity tolerance, Pain, Improper body mechanics, Impaired flexibility, Decreased mobility, Decreased strength, Postural dysfunction  Visit Diagnosis: 1. Pain in right hip   2. Acute bilateral low back pain without sciatica   3. Muscle spasm of back        Problem List Patient Active Problem List   Diagnosis Date Noted  . Shoulder arthritis 05/23/2017  . Hereditary and idiopathic peripheral neuropathy 03/24/2015  . 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 Pain of right heel 12/18/2012    Sumner Boast., PT 07/05/2019, 10:11 AM  [place], [place], [address] Phone: [phone]   Fax:  [phone]  Name: MALIC ROSTEN MRN: [id number] Date of Birth: 03/09/1946

## 2019-07-08 ENCOUNTER — Encounter: Payer: Self-pay | Admitting: Physical Therapy

## 2019-07-10 ENCOUNTER — Encounter: Payer: Self-pay | Admitting: Physical Therapy

## 2019-07-12 ENCOUNTER — Encounter: Payer: Self-pay | Admitting: Physical Therapy

## 2019-07-15 ENCOUNTER — Other Ambulatory Visit: Payer: Self-pay

## 2019-07-15 ENCOUNTER — Encounter: Payer: Self-pay | Admitting: Physical Therapy

## 2019-07-15 ENCOUNTER — Ambulatory Visit: Payer: Medicare Other | Admitting: Physical Therapy

## 2019-07-15 DIAGNOSIS — M25551 Pain in right hip: Secondary | ICD-10-CM

## 2019-07-15 DIAGNOSIS — M62830 Muscle spasm of back: Secondary | ICD-10-CM

## 2019-07-15 DIAGNOSIS — M545 Low back pain, unspecified: Secondary | ICD-10-CM

## 2019-07-15 NOTE — Therapy (Signed)
[place], [place], [address] Phone: [phone]   Fax:  [phone]  Physical Therapy Treatment  Patient Details  Name: George Freeman MRN: [id number] Date of Birth: Nov 20, 1946 Referring Provider (PT): Coletta Memos   Encounter Date: 07/15/2019  PT End of Session - 07/15/19 0928    Visit Number  13    Date for PT Re-Evaluation  07/27/19    PT Start Time  0843    PT Stop Time  0940    PT Time Calculation (min)  57 min    Activity Tolerance  Patient tolerated treatment well    Behavior During Therapy  [name] for tasks assessed/performed       Past Medical History:  Diagnosis Date  . Anxiety   . Arthritis   . BPV (benign positional vertigo)   . Depression   . GERD (gastroesophageal reflux disease)   . Neuromuscular disorder ([place])   . Panic disorder   . PONV (postoperative nausea and vomiting)    chills and anxiety after surgery    Past Surgical History:  Procedure Laterality Date  . ANAL FISSURE REPAIR  1993  . APPENDECTOMY  1969  . 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 BACK SURGERY  2011   Stenosis, sciatica  . BACK SURGERY  December 2014  . CERVICAL SPINE SURGERY  Feb. 2014  . CERVICAL SPINE SURGERY  07/23/15   fusion  . COLONOSCOPY    . KNEE SURGERY     2x- cartilage  . left foot surgery  1985  . Left shoulder surgery  1990  . Right shoulder surgery  2012  . TOTAL KNEE ARTHROPLASTY  January 2011  . TOTAL KNEE ARTHROPLASTY  Feb. 2012  . TOTAL SHOULDER ARTHROPLASTY Right 05/23/2017   Procedure: RIGHT TOTAL SHOULDER ARTHROPLASTY;  Surgeon: Meredith Pel, MD;  Location: [facility];  Service: Orthopedics;  Laterality: Right;  Marland Kitchen VASECTOMY      There were no vitals filed for this visit.  Subjective Assessment - 07/15/19 0926    Subjective  Patient was on vacation last week, report that he is happy that he feels so good, he was able to go on a hike and did not have much issues.     Currently in Pain?  Yes    Pain Score  2     Pain Location  Hip    Pain Orientation  Right;Left    Pain Relieving Factors  I really think that this has helped a lot, I was able to vacation without much cifficulty                       [facility] Adult PT Treatment/Exercise - 07/15/19 0001      Lumbar Exercises: Supine   Other Supine Lumbar Exercises  feet on ball K2C, trunk rotation, bridges and isometric abs      Moist Heat Therapy   Number Minutes Moist Heat  15 Minutes    Moist Heat Location  Hip;Lumbar Spine      Electrical Stimulation   Electrical Stimulation Location  to the right hip flexor area, and the left SI area    Electrical Stimulation Action  --   pre mod   Electrical Stimulation Parameters  sitting    Electrical Stimulation Goals  Pain      Manual Therapy   Manual Therapy  Myofascial release;Soft tissue mobilization    Manual therapy  comments  passive ROM of the LE's and the lumbar spine    Soft tissue mobilization  tot he left SI and buttock are use of some vibration    Myofascial Release  worked on the psoas mm trying to get deep enough to release this, able to get pretty good at the insertion but more difficulty in the mm belly but able to tolerate to some extent               PT Short Term Goals - 06/14/19 1008      PT SHORT TERM GOAL #1   Title  independent with initial HEP    Status  On-going        PT Long Term Goals - 07/05/19 1009      PT LONG TERM GOAL #1   Title  decrease pain 50%    Status  Partially Met            Plan - 07/15/19 0928    Clinical Impression Statement  Patient still tight in the left hip especially the piriformis mm.  He had a great vacation without much complication from his issues.  He cannot cross the left leg over the right due to tightness    PT Next Visit Plan  work on some stability    Consulted and Agree with Plan of Care  Patient       Patient will benefit from skilled therapeutic  intervention in order to improve the following deficits and impairments:  Decreased range of motion, Difficulty walking, Increased muscle spasms, Decreased activity tolerance, Pain, Improper body mechanics, Impaired flexibility, Decreased mobility, Decreased strength, Postural dysfunction  Visit Diagnosis: 1. Pain in right hip   2. Muscle spasm of back   3. Acute bilateral low back pain without sciatica        Problem List Patient Active Problem List   Diagnosis Date Noted  . Shoulder arthritis 05/23/2017  . Hereditary and idiopathic peripheral neuropathy 03/24/2015  . 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 Pain of right heel 12/18/2012    Sumner Boast., PT 07/15/2019, 9:30 AM  [place], [place], [address] Phone: [phone]   Fax:  [phone]  Name: George Freeman MRN: [id number] Date of Birth: 1946-07-16

## 2019-07-17 ENCOUNTER — Ambulatory Visit: Payer: Medicare Other | Admitting: Physical Therapy

## 2019-07-17 ENCOUNTER — Other Ambulatory Visit: Payer: Self-pay

## 2019-07-17 ENCOUNTER — Encounter: Payer: Self-pay | Admitting: Physical Therapy

## 2019-07-17 DIAGNOSIS — M25551 Pain in right hip: Secondary | ICD-10-CM | POA: Diagnosis not present

## 2019-07-17 DIAGNOSIS — M545 Low back pain, unspecified: Secondary | ICD-10-CM

## 2019-07-17 DIAGNOSIS — M62830 Muscle spasm of back: Secondary | ICD-10-CM

## 2019-07-17 NOTE — Therapy (Signed)
[place], [place], [address] Phone: [phone]   Fax:  [phone]  Physical Therapy Treatment  Patient Details  Name: George Freeman MRN: [id number] Date of Birth: Dec 12, 1946 Referring Provider (PT): Coletta Memos   Encounter Date: 07/17/2019  PT End of Session - 07/17/19 0926    Visit Number  14    Date for PT Re-Evaluation  07/27/19    PT Start Time  0846    PT Stop Time  0933    PT Time Calculation (min)  47 min    Activity Tolerance  Patient tolerated treatment well    Behavior During Therapy  [facility] for tasks assessed/performed       Past Medical History:  Diagnosis Date  . Anxiety   . Arthritis   . BPV (benign positional vertigo)   . Depression   . GERD (gastroesophageal reflux disease)   . Neuromuscular disorder ([place])   . Panic disorder   . PONV (postoperative nausea and vomiting)    chills and anxiety after surgery    Past Surgical History:  Procedure Laterality Date  . ANAL FISSURE REPAIR  1993  . APPENDECTOMY  1969  . 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 BACK SURGERY  2011   Stenosis, sciatica  . BACK SURGERY  December 2014  . CERVICAL SPINE SURGERY  Feb. 2014  . CERVICAL SPINE SURGERY  07/23/15   fusion  . COLONOSCOPY    . KNEE SURGERY     2x- cartilage  . left foot surgery  1985  . Left shoulder surgery  1990  . Right shoulder surgery  2012  . TOTAL KNEE ARTHROPLASTY  January 2011  . TOTAL KNEE ARTHROPLASTY  Feb. 2012  . TOTAL SHOULDER ARTHROPLASTY Right 05/23/2017   Procedure: RIGHT TOTAL SHOULDER ARTHROPLASTY;  Surgeon: Meredith Pel, MD;  Location: [facility];  Service: Orthopedics;  Laterality: Right;  Marland Kitchen VASECTOMY      There were no vitals filed for this visit.  Subjective Assessment - 07/17/19 0925    Subjective  Patient reports a little more discomfort with walking this AM    Currently in Pain?  Yes    Pain Score  3     Pain Location  Hip    Pain  Orientation  Right;Left    Aggravating Factors   walking                       [facility] Adult PT Treatment/Exercise - 07/17/19 0001      Moist Heat Therapy   Number Minutes Moist Heat  15 Minutes    Moist Heat Location  Hip;Lumbar Spine      Electrical Stimulation   Electrical Stimulation Location  to the right hip flexor area, and the left SI area    Electrical Stimulation Action  IFC    Electrical Stimulation Parameters  sitting    Electrical Stimulation Goals  Pain      Manual Therapy   Manual Therapy  Myofascial release;Soft tissue mobilization    Manual therapy comments  passive ROM of the LE's and the lumbar spine    Soft tissue mobilization  tot he left SI and buttock are use of some vibration    Myofascial Release  worked on the psoas mm trying to get deep enough to release this, able to get pretty good at the insertion but more difficulty in the mm belly but  able to tolerate to some extent               PT Short Term Goals - 06/14/19 1008      PT SHORT TERM GOAL #1   Title  independent with initial HEP    Status  On-going        PT Long Term Goals - 07/17/19 0927      PT LONG TERM GOAL #1   Title  decrease pain 50%    Status  Partially Met      PT LONG TERM GOAL #2   Title  understand proper posture and body mechanics    Status  Achieved      PT LONG TERM GOAL #3   Title  increase lumbar ROM 25%    Status  Achieved            Plan - 07/17/19 0926    Clinical Impression Statement  I worked again on the psoas on the right, less tender and able to get to it without much gaurding, it is tender.  Flexibility of HS and quads is improving, still tight in the piriformis    PT Next Visit Plan  work on some stability    Consulted and Agree with Plan of Care  Patient       Patient will benefit from skilled therapeutic intervention in order to improve the following deficits and impairments:  Decreased range of motion, Difficulty walking,  Increased muscle spasms, Decreased activity tolerance, Pain, Improper body mechanics, Impaired flexibility, Decreased mobility, Decreased strength, Postural dysfunction  Visit Diagnosis: 1. Pain in right hip   2. Muscle spasm of back   3. Acute bilateral low back pain without sciatica        Problem List Patient Active Problem List   Diagnosis Date Noted  . Shoulder arthritis 05/23/2017  . Hereditary and idiopathic peripheral neuropathy 03/24/2015  . 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 Pain of right heel 12/18/2012    Sumner Boast., PT 07/17/2019, 9:28 AM  [facility] [place], [place], [address] Phone: [phone]   Fax:  [phone]  Name: George Freeman MRN: [id number] Date of Birth: March 21, 1946

## 2019-07-19 ENCOUNTER — Other Ambulatory Visit: Payer: Self-pay

## 2019-07-19 ENCOUNTER — Ambulatory Visit: Payer: Medicare Other | Admitting: Physical Therapy

## 2019-07-19 ENCOUNTER — Encounter: Payer: Self-pay | Admitting: Physical Therapy

## 2019-07-19 DIAGNOSIS — M62830 Muscle spasm of back: Secondary | ICD-10-CM

## 2019-07-19 DIAGNOSIS — M25551 Pain in right hip: Secondary | ICD-10-CM | POA: Diagnosis not present

## 2019-07-19 DIAGNOSIS — M545 Low back pain, unspecified: Secondary | ICD-10-CM

## 2019-07-19 NOTE — Therapy (Signed)
[place], [place], [address] Phone: [phone]   Fax:  [phone]  Physical Therapy Treatment  Patient Details  Name: George Freeman MRN: [id number] Date of Birth: 1946-01-07 Referring Provider (PT): Coletta Memos   Encounter Date: 07/19/2019  PT End of Session - 07/19/19 0922    Visit Number  15    Date for PT Re-Evaluation  07/27/19    PT Start Time  0845    PT Stop Time  0933    PT Time Calculation (min)  48 min    Activity Tolerance  Patient tolerated treatment well    Behavior During Therapy  [facility] for tasks assessed/performed       Past Medical History:  Diagnosis Date  . Anxiety   . Arthritis   . BPV (benign positional vertigo)   . Depression   . GERD (gastroesophageal reflux disease)   . Neuromuscular disorder ([place])   . Panic disorder   . PONV (postoperative nausea and vomiting)    chills and anxiety after surgery    Past Surgical History:  Procedure Laterality Date  . ANAL FISSURE REPAIR  1993  . APPENDECTOMY  1969  . 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 BACK SURGERY  2011   Stenosis, sciatica  . BACK SURGERY  December 2014  . CERVICAL SPINE SURGERY  Feb. 2014  . CERVICAL SPINE SURGERY  07/23/15   fusion  . COLONOSCOPY    . KNEE SURGERY     2x- cartilage  . left foot surgery  1985  . Left shoulder surgery  1990  . Right shoulder surgery  2012  . TOTAL KNEE ARTHROPLASTY  January 2011  . TOTAL KNEE ARTHROPLASTY  Feb. 2012  . TOTAL SHOULDER ARTHROPLASTY Right 05/23/2017   Procedure: RIGHT TOTAL SHOULDER ARTHROPLASTY;  Surgeon: Meredith Pel, MD;  Location: [place];  Service: Orthopedics;  Laterality: Right;  Marland Kitchen VASECTOMY      There were no vitals filed for this visit.  Subjective Assessment - 07/19/19 0921    Subjective  Patient reports tha the was feeling good enough to try golf again, he reports he played 18 holes and did well, not extra pain at the time  but reports today he is pretty sore    Currently in Pain?  Yes    Pain Score  4     Pain Location  Hip    Pain Orientation  Right;Left    Aggravating Factors   golf                       [facility] Adult PT Treatment/Exercise - 07/19/19 0001      Moist Heat Therapy   Number Minutes Moist Heat  15 Minutes    Moist Heat Location  Hip;Lumbar Spine      Electrical Stimulation   Electrical Stimulation Location  to the right hip flexor area, and the left SI area    Electrical Stimulation Action  IFC    Electrical Stimulation Parameters  sitting    Electrical Stimulation Goals  Pain      Manual Therapy   Manual Therapy  Myofascial release;Soft tissue mobilization    Manual therapy comments  passive ROM of the LE's and the lumbar spine    Soft tissue mobilization  tot he left SI and buttock are use of some vibration    Myofascial Release  worked on the psoas mm  trying to get deep enough to release this, able to get pretty good at the insertion but more difficulty in the mm belly but able to tolerate to some extent               PT Short Term Goals - 06/14/19 1008      PT SHORT TERM GOAL #1   Title  independent with initial HEP    Status  On-going        PT Long Term Goals - 07/19/19 0924      PT LONG TERM GOAL #1   Title  decrease pain 50%    Status  Partially Met      PT LONG TERM GOAL #4   Title  get up from sitting without any difficulty    Status  Achieved            Plan - 07/19/19 0923    Clinical Impression Statement  Able to play golf yesterday without much increase of pain.  He reports that he is sore today.  Has some increased tenderness in the psoas and the lumbar area, was a little tighter today    PT Next Visit Plan  reports that there is no way he could have played golf without what we have done here, he will continue next week    Consulted and Agree with Plan of Care  Patient       Patient will benefit from skilled therapeutic  intervention in order to improve the following deficits and impairments:  Decreased range of motion, Difficulty walking, Increased muscle spasms, Decreased activity tolerance, Pain, Improper body mechanics, Impaired flexibility, Decreased mobility, Decreased strength, Postural dysfunction  Visit Diagnosis: 1. Pain in right hip   2. Muscle spasm of back   3. Acute bilateral low back pain without sciatica        Problem List Patient Active Problem List   Diagnosis Date Noted  . Shoulder arthritis 05/23/2017  . Hereditary and idiopathic peripheral neuropathy 03/24/2015  . 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 Pain of right heel 12/18/2012    Sumner Boast., PT 07/19/2019, 9:25 AM  [place], [place], [address] Phone: [phone]   Fax:  [phone]  Name: George Freeman MRN: [id number] Date of Birth: [birth year]

## 2019-07-22 ENCOUNTER — Encounter: Payer: Self-pay | Admitting: Physical Therapy

## 2019-07-22 ENCOUNTER — Ambulatory Visit: Payer: Medicare Other | Admitting: Physical Therapy

## 2019-07-22 ENCOUNTER — Other Ambulatory Visit: Payer: Self-pay

## 2019-07-22 DIAGNOSIS — M545 Low back pain, unspecified: Secondary | ICD-10-CM

## 2019-07-22 DIAGNOSIS — M25551 Pain in right hip: Secondary | ICD-10-CM

## 2019-07-22 DIAGNOSIS — M62830 Muscle spasm of back: Secondary | ICD-10-CM

## 2019-07-22 NOTE — Therapy (Signed)
[place], [place], [address] Phone: [phone]   Fax:  [phone]  Physical Therapy Treatment  Patient Details  Name: George Freeman MRN: [id number] Date of Birth: [birth year] Referring Provider (PT): Coletta Memos   Encounter Date: 07/22/2019  PT End of Session - 07/22/19 1100    Visit Number  16    Date for PT Re-Evaluation  07/27/19    PT Start Time  3335    PT Stop Time  1104    PT Time Calculation (min)  49 min    Activity Tolerance  Patient tolerated treatment well    Behavior During Therapy  [facility] for tasks assessed/performed       Past Medical History:  Diagnosis Date  . Anxiety   . Arthritis   . BPV (benign positional vertigo)   . Depression   . GERD (gastroesophageal reflux disease)   . Neuromuscular disorder ([place])   . Panic disorder   . PONV (postoperative nausea and vomiting)    chills and anxiety after surgery    Past Surgical History:  Procedure Laterality Date  . ANAL FISSURE REPAIR  1993  . APPENDECTOMY  1969  . 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 BACK SURGERY  2011   Stenosis, sciatica  . BACK SURGERY  December 2014  . CERVICAL SPINE SURGERY  Feb. 2014  . CERVICAL SPINE SURGERY  07/23/15   fusion  . COLONOSCOPY    . KNEE SURGERY     2x- cartilage  . left foot surgery  1985  . Left shoulder surgery  1990  . Right shoulder surgery  2012  . TOTAL KNEE ARTHROPLASTY  January 2011  . TOTAL KNEE ARTHROPLASTY  Feb. 2012  . TOTAL SHOULDER ARTHROPLASTY Right 05/23/2017   Procedure: RIGHT TOTAL SHOULDER ARTHROPLASTY;  Surgeon: Meredith Pel, MD;  Location: [place];  Service: Orthopedics;  Laterality: Right;  Marland Kitchen VASECTOMY      There were no vitals filed for this visit.  Subjective Assessment - 07/22/19 1017    Subjective  Reports that he walked yesterday for al,most 2 miles without pain    Currently in Pain?  Yes    Pain Score  2     Pain Location  Hip    Aggravating Factors   feeling good                       [facility] Adult PT Treatment/Exercise - 07/22/19 0001      Moist Heat Therapy   Number Minutes Moist Heat  15 Minutes    Moist Heat Location  Hip;Lumbar Spine      Electrical Stimulation   Electrical Stimulation Location  to the right hip flexor area, and the left SI area    Electrical Stimulation Action  IFC    Electrical Stimulation Parameters  sitting    Electrical Stimulation Goals  Pain      Manual Therapy   Manual Therapy  Myofascial release;Soft tissue mobilization    Manual therapy comments  passive ROM of the LE's and the lumbar spine    Soft tissue mobilization  tot he left SI and buttock are use of some vibration    Myofascial Release  worked on the psoas mm trying to get deep enough to release this, able to get pretty good at the insertion but more difficulty in the mm belly but able to tolerate to some extent  PT Short Term Goals - 06/14/19 1008      PT SHORT TERM GOAL #1   Title  independent with initial HEP    Status  On-going        PT Long Term Goals - 07/19/19 0924      PT LONG TERM GOAL #1   Title  decrease pain 50%    Status  Partially Met      PT LONG TERM GOAL #4   Title  get up from sitting without any difficulty    Status  Achieved            Plan - 07/22/19 1101    Clinical Impression Statement  Patient very pleased at his increased ability especially with walking, he tested the walking yesterday as he will be on vacation for the next month and wanted to be sure he could tolerate the activities.  He reports minimal pain with this.  He reports that the soreness/tenderness is getting much better with the STM    PT Next Visit Plan  see 1 more or two more visits prioe to him leaving on vacation    Consulted and Agree with Plan of Care  Patient       Patient will benefit from skilled therapeutic intervention in order to improve the following deficits  and impairments:  Decreased range of motion, Difficulty walking, Increased muscle spasms, Decreased activity tolerance, Pain, Improper body mechanics, Impaired flexibility, Decreased mobility, Decreased strength, Postural dysfunction  Visit Diagnosis: 1. Muscle spasm of back   2. Pain in right hip   3. Acute bilateral low back pain without sciatica        Problem List Patient Active Problem List   Diagnosis Date Noted  . Shoulder arthritis 05/23/2017  . Hereditary and idiopathic peripheral neuropathy 03/24/2015  . 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 Pain of right heel 12/18/2012    Sumner Boast., PT 07/22/2019, 11:03 AM  [place], [place], [address] Phone: [phone]   Fax:  [phone]  Name: George Freeman MRN: [id number] Date of Birth: [birth year]

## 2019-07-24 ENCOUNTER — Ambulatory Visit: Payer: Medicare Other | Admitting: Physical Therapy

## 2019-07-24 ENCOUNTER — Other Ambulatory Visit: Payer: Self-pay

## 2019-07-24 ENCOUNTER — Encounter: Payer: Self-pay | Admitting: Physical Therapy

## 2019-07-24 DIAGNOSIS — M545 Low back pain, unspecified: Secondary | ICD-10-CM

## 2019-07-24 DIAGNOSIS — M25551 Pain in right hip: Secondary | ICD-10-CM

## 2019-07-24 DIAGNOSIS — M62830 Muscle spasm of back: Secondary | ICD-10-CM

## 2019-07-24 NOTE — Therapy (Signed)
[place], [place], [address] Phone: [phone]   Fax:  [phone]  Physical Therapy Treatment  Patient Details  Name: George Freeman MRN: [id number] Date of Birth: [birth year] Referring Provider (PT): Coletta Memos   Encounter Date: 07/24/2019  PT End of Session - 07/24/19 1345    Visit Number  17    Date for PT Re-Evaluation  07/27/19    PT Start Time  1517    PT Stop Time  1352    PT Time Calculation (min)  47 min    Activity Tolerance  Patient tolerated treatment well    Behavior During Therapy  [facility] for tasks assessed/performed       Past Medical History:  Diagnosis Date  . Anxiety   . Arthritis   . BPV (benign positional vertigo)   . Depression   . GERD (gastroesophageal reflux disease)   . Neuromuscular disorder ([place])   . Panic disorder   . PONV (postoperative nausea and vomiting)    chills and anxiety after surgery    Past Surgical History:  Procedure Laterality Date  . ANAL FISSURE REPAIR  1993  . APPENDECTOMY  1969  . 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 BACK SURGERY  2011   Stenosis, sciatica  . BACK SURGERY  December 2014  . CERVICAL SPINE SURGERY  Feb. 2014  . CERVICAL SPINE SURGERY  07/23/15   fusion  . COLONOSCOPY    . KNEE SURGERY     2x- cartilage  . left foot surgery  1985  . Left shoulder surgery  1990  . Right shoulder surgery  2012  . TOTAL KNEE ARTHROPLASTY  January 2011  . TOTAL KNEE ARTHROPLASTY  Feb. 2012  . TOTAL SHOULDER ARTHROPLASTY Right 05/23/2017   Procedure: RIGHT TOTAL SHOULDER ARTHROPLASTY;  Surgeon: Meredith Pel, MD;  Location: [place];  Service: Orthopedics;  Laterality: Right;  Marland Kitchen VASECTOMY      There were no vitals filed for this visit.  Subjective Assessment - 07/24/19 1343    Subjective  Patient again reports walking this AM almost 2 miles with little increae in pain, reports I really am feeling the best I have in years    Currently in Pain?  Yes    Pain Score  1     Pain Location  Hip    Pain Orientation  Right;Left                       [facility] Adult PT Treatment/Exercise - 07/24/19 0001      Moist Heat Therapy   Number Minutes Moist Heat  15 Minutes    Moist Heat Location  Hip;Lumbar Spine      Electrical Stimulation   Electrical Stimulation Location  to the right hip flexor area, and the left SI area    Electrical Stimulation Action  IFC    Electrical Stimulation Parameters  sitting    Electrical Stimulation Goals  Pain      Manual Therapy   Manual Therapy  Myofascial release;Soft tissue mobilization    Manual therapy comments  passive ROM of the LE's and the lumbar spine    Soft tissue mobilization  tot he left SI and buttock are use of some vibration    Myofascial Release  worked on the psoas mm trying to get deep enough to release this, able to get pretty good at the insertion but more  difficulty in the mm belly but able to tolerate to some extent               PT Short Term Goals - 06/14/19 1008      PT SHORT TERM GOAL #1   Title  independent with initial HEP    Status  On-going        PT Long Term Goals - 07/19/19 0924      PT LONG TERM GOAL #1   Title  decrease pain 50%    Status  Partially Met      PT LONG TERM GOAL #4   Title  get up from sitting without any difficulty    Status  Achieved            Plan - 07/24/19 1346    Clinical Impression Statement  Patient reports that he is feeling as good as he has in years.  He is planning on a month long trip leaving next week and is excited that he is feeling so good and has been able to walk without increase of pain, he is tender in the right psoas, the tenderness here and in the left SI area is much less    PT Next Visit Plan  1 more visit and then he will be on vacation    Consulted and Agree with Plan of Care  Patient       Patient will benefit from skilled therapeutic intervention in order to  improve the following deficits and impairments:  Decreased range of motion, Difficulty walking, Increased muscle spasms, Decreased activity tolerance, Pain, Improper body mechanics, Impaired flexibility, Decreased mobility, Decreased strength, Postural dysfunction  Visit Diagnosis: 1. Muscle spasm of back   2. Pain in right hip   3. Acute bilateral low back pain without sciatica        Problem List Patient Active Problem List   Diagnosis Date Noted  . Shoulder arthritis 05/23/2017  . Hereditary and idiopathic peripheral neuropathy 03/24/2015  . 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 Pain of right heel 12/18/2012    Sumner Boast., PT 07/24/2019, 1:48 PM  [place], [place], [address] Phone: [phone]   Fax:  [phone]  Name: CHRISTOFFER CURRIER MRN: [id number] Date of Birth: 20-Nov-1946

## 2019-07-26 ENCOUNTER — Encounter: Payer: Self-pay | Admitting: Physical Therapy

## 2019-07-26 ENCOUNTER — Ambulatory Visit: Payer: Medicare Other | Admitting: Physical Therapy

## 2019-07-26 ENCOUNTER — Other Ambulatory Visit: Payer: Self-pay

## 2019-07-26 DIAGNOSIS — M62830 Muscle spasm of back: Secondary | ICD-10-CM

## 2019-07-26 DIAGNOSIS — M25551 Pain in right hip: Secondary | ICD-10-CM

## 2019-07-26 DIAGNOSIS — M545 Low back pain, unspecified: Secondary | ICD-10-CM

## 2019-07-26 NOTE — Therapy (Signed)
[place], [place], [address] Phone: [phone]   Fax:  [phone]  Physical Therapy Treatment  Patient Details  Name: George Freeman MRN: [id number] Date of Birth: 11/17/1946 Referring Provider (PT): Coletta Memos   Encounter Date: 07/26/2019  PT End of Session - 07/26/19 0929    Visit Number  18    PT Start Time  0842    PT Stop Time  0933    PT Time Calculation (min)  51 min    Activity Tolerance  Patient tolerated treatment well    Behavior During Therapy  [facility] for tasks assessed/performed       Past Medical History:  Diagnosis Date  . Anxiety   . Arthritis   . BPV (benign positional vertigo)   . Depression   . GERD (gastroesophageal reflux disease)   . Neuromuscular disorder ([place])   . Panic disorder   . PONV (postoperative nausea and vomiting)    chills and anxiety after surgery    Past Surgical History:  Procedure Laterality Date  . ANAL FISSURE REPAIR  1993  . APPENDECTOMY  1969  . 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 BACK SURGERY  2011   Stenosis, sciatica  . BACK SURGERY  December 2014  . CERVICAL SPINE SURGERY  Feb. 2014  . CERVICAL SPINE SURGERY  07/23/15   fusion  . COLONOSCOPY    . KNEE SURGERY     2x- cartilage  . left foot surgery  1985  . Left shoulder surgery  1990  . Right shoulder surgery  2012  . TOTAL KNEE ARTHROPLASTY  January 2011  . TOTAL KNEE ARTHROPLASTY  Feb. 2012  . TOTAL SHOULDER ARTHROPLASTY Right 05/23/2017   Procedure: RIGHT TOTAL SHOULDER ARTHROPLASTY;  Surgeon: Meredith Pel, MD;  Location: [place];  Service: Orthopedics;  Laterality: Right;  Marland Kitchen VASECTOMY      There were no vitals filed for this visit.  Subjective Assessment - 07/26/19 0928    Subjective  patient went to the driving range yesterday and had to carry his clubs up and down a hill, he reports that he did not have a strap, reports increased soreness and pain    Currently in Pain?  Yes    Pain Score  3     Pain Location  Hip    Pain Orientation  Right;Left                       [facility] Adult PT Treatment/Exercise - 07/26/19 0001      Moist Heat Therapy   Number Minutes Moist Heat  15 Minutes    Moist Heat Location  Hip;Lumbar Spine      Electrical Stimulation   Electrical Stimulation Location  to the right hip flexor area, and the left SI area    Electrical Stimulation Action  IFC    Electrical Stimulation Parameters  sitting    Electrical Stimulation Goals  Pain      Manual Therapy   Manual Therapy  Myofascial release;Soft tissue mobilization    Manual therapy comments  passive ROM of the LE's and the lumbar spine    Soft tissue mobilization  tot he left SI and buttock are use of some vibration    Myofascial Release  worked on the psoas mm trying to get deep enough to release this, able to get pretty good at the insertion but more difficulty in  the mm belly but able to tolerate to some extent               PT Short Term Goals - 06/14/19 1008      PT SHORT TERM GOAL #1   Title  independent with initial HEP    Status  On-going        PT Long Term Goals - 07/26/19 0939      PT LONG TERM GOAL #1   Title  decrease pain 50%    Status  Achieved      PT LONG TERM GOAL #5   Title  walk 2 miles without difficulty    Status  Achieved            Plan - 07/26/19 0930    Clinical Impression Statement  Patient reports that he will be on vacation over the next month and feels really good, a little concerned about the increase of pain carrying the golf clubs.  He feels like he knows the stretches to do and reports that he has had his wife do some of them    PT Next Visit Plan  D/C goals met    Consulted and Agree with Plan of Care  Patient       Patient will benefit from skilled therapeutic intervention in order to improve the following deficits and impairments:  Decreased range of motion, Difficulty walking,  Increased muscle spasms, Decreased activity tolerance, Pain, Improper body mechanics, Impaired flexibility, Decreased mobility, Decreased strength, Postural dysfunction  Visit Diagnosis: 1. Muscle spasm of back   2. Pain in right hip   3. Acute bilateral low back pain without sciatica        Problem List Patient Active Problem List   Diagnosis Date Noted  . Shoulder arthritis 05/23/2017  . Hereditary and idiopathic peripheral neuropathy 03/24/2015  . 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 Pain of right heel 12/18/2012    Sumner Boast., PT 07/26/2019, 9:40 AM  [place], [place], [address] Phone: [phone]   Fax:  [phone]  Name: George Freeman MRN: [id number] Date of Birth: 09-10-1946

## 2019-08-22 ENCOUNTER — Telehealth: Payer: Self-pay | Admitting: Orthopedic Surgery

## 2019-08-22 NOTE — Telephone Encounter (Signed)
Please advise. Thanks.  

## 2019-08-22 NOTE — Telephone Encounter (Signed)
Patient called asked if he can be set up for surgery for his middle trigger fingers on both hands or does he need to set up a office visit first? The number to contact patient is [phone]

## 2019-08-23 NOTE — Telephone Encounter (Signed)
Blue sheet done pls clala htx

## 2019-08-30 DIAGNOSIS — M65331 Trigger finger, right middle finger: Secondary | ICD-10-CM

## 2019-08-30 DIAGNOSIS — M65332 Trigger finger, left middle finger: Secondary | ICD-10-CM

## 2019-09-09 ENCOUNTER — Other Ambulatory Visit: Payer: Self-pay | Admitting: Surgical

## 2019-09-09 DIAGNOSIS — M65331 Trigger finger, right middle finger: Secondary | ICD-10-CM | POA: Diagnosis not present

## 2019-09-09 DIAGNOSIS — M65332 Trigger finger, left middle finger: Secondary | ICD-10-CM | POA: Diagnosis not present

## 2019-09-09 MED ORDER — TIZANIDINE HCL 2 MG PO TABS: 2.0000 mg | ORAL_TABLET | Freq: Three times a day (TID) | ORAL | 0 refills | Status: DC | PRN

## 2019-09-09 MED ORDER — ASPIRIN EC 81 MG PO TBEC: 81.0000 mg | DELAYED_RELEASE_TABLET | Freq: Every day | ORAL | 0 refills | Status: AC

## 2019-09-09 MED ORDER — OXYCODONE HCL 5 MG PO TABS: 5.0000 mg | ORAL_TABLET | Freq: Four times a day (QID) | ORAL | 0 refills | Status: AC | PRN

## 2019-09-12 ENCOUNTER — Other Ambulatory Visit: Payer: Self-pay | Admitting: Surgical

## 2019-09-12 NOTE — Telephone Encounter (Signed)
Ok to rf? 

## 2019-09-12 NOTE — Telephone Encounter (Signed)
This is a GD pt 

## 2019-09-12 NOTE — Telephone Encounter (Signed)
y

## 2019-09-18 ENCOUNTER — Ambulatory Visit (INDEPENDENT_AMBULATORY_CARE_PROVIDER_SITE_OTHER): Payer: Medicare Other | Admitting: Orthopedic Surgery

## 2019-09-18 DIAGNOSIS — M65332 Trigger finger, left middle finger: Secondary | ICD-10-CM

## 2019-09-18 DIAGNOSIS — M65331 Trigger finger, right middle finger: Secondary | ICD-10-CM

## 2019-09-18 NOTE — Progress Notes (Signed)
   Post-Op Visit Note   Patient: George Freeman           Date of Birth: 03/28/1946           MRN: [id number] Visit Date: 09/18/2019 PCP: Bernerd Limbo, MD   Assessment & Plan:  Chief Complaint: No chief complaint on file.  Visit Diagnoses: No diagnosis found.  Plan: Pt is a 72 y.o. Male s/p bilateral trigger finger release.  Pt states he is doing well with little pain.  Has not taken any pain medication.  Sutures removed today and incisions were gapping a small bit so steri-strips were placed over the incision.  No drainage, fevers, chills, night sweats.  Discouraged patient from finger hyperextension and heavy lifting.  He has great finger ROM today in clinic.  Patient will follow-up in 4 weeks.  Follow-Up Instructions: No follow-ups on file.   Orders:  No orders of the defined types were placed in this encounter.  No orders of the defined types were placed in this encounter.   Imaging: No results found.  PMFS History: Patient Active Problem List   Diagnosis Date Noted  . Shoulder arthritis 05/23/2017  . Hereditary and idiopathic peripheral neuropathy 03/24/2015  . 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 Pain of right heel 12/18/2012   Past Medical History:  Diagnosis Date  . Anxiety   . Arthritis   . BPV (benign positional vertigo)   . Depression   . GERD (gastroesophageal reflux disease)   . Neuromuscular disorder ([place])   . Panic disorder   . PONV (postoperative nausea and vomiting)    chills and anxiety after surgery    Family History  Problem Relation Age of Onset  . Diabetes Mother   . Alzheimer's disease Sister   . Acute myelogenous leukemia Unknown   . Neuropathy Neg Hx     Past Surgical History:  Procedure Laterality Date  . ANAL FISSURE REPAIR  1993  . APPENDECTOMY  1969  . 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 BACK SURGERY  2011   Stenosis, sciatica  . BACK SURGERY  December 2014  . CERVICAL SPINE SURGERY   Feb. 2014  . CERVICAL SPINE SURGERY  07/23/15   fusion  . COLONOSCOPY    . KNEE SURGERY     2x- cartilage  . left foot surgery  1985  . Left shoulder surgery  1990  . Right shoulder surgery  2012  . TOTAL KNEE ARTHROPLASTY  January 2011  . TOTAL KNEE ARTHROPLASTY  Feb. 2012  . TOTAL SHOULDER ARTHROPLASTY Right 05/23/2017   Procedure: RIGHT TOTAL SHOULDER ARTHROPLASTY;  Surgeon: Meredith Pel, MD;  Location: [place];  Service: Orthopedics;  Laterality: Right;  Marland Kitchen VASECTOMY     Social History   Occupational History  . Occupation: Retired  Tobacco Use  . Smoking status: Never Smoker  . Smokeless tobacco: Never Used  Substance and Sexual Activity  . Alcohol use: No    Alcohol/week: 0.0 standard drinks  . Drug use: No  . Sexual activity: Not on file

## 2019-09-20 ENCOUNTER — Encounter: Payer: Self-pay | Admitting: Orthopedic Surgery

## 2019-09-22 ENCOUNTER — Other Ambulatory Visit: Payer: Self-pay | Admitting: Orthopedic Surgery

## 2020-06-10 ENCOUNTER — Ambulatory Visit: Payer: Medicare PPO | Admitting: Orthopedic Surgery

## 2020-06-10 DIAGNOSIS — M65321 Trigger finger, right index finger: Secondary | ICD-10-CM

## 2020-06-11 ENCOUNTER — Encounter: Payer: Self-pay | Admitting: Orthopedic Surgery

## 2020-06-11 NOTE — Progress Notes (Signed)
Office Visit Note   Patient: George Freeman           Date of Birth: [birth year]           MRN: [id number] Visit Date: 06/10/2020 Requested by: Tracey Harries, MD [address] [place],  [place] [address] PCP: Tracey Harries, MD  Subjective: Chief Complaint  Patient presents with  . Right Hand - Pain    HPI: George Freeman is a 74 y.o. male who presents to the office complaining of right index trigger finger.  Patient notes that his right index finger has been triggering and he wants to have surgery but he is unable to have surgery until August.  He has a history of multiple trigger fingers.  He has had 1 injection into this finger for trigger finger back in October 2019.  He denies any numbness or tingling into the finger.  He denies any injury to the finger.  He is not taking any medication aside from the occasional Advil..                ROS:  All systems reviewed are negative as they relate to the chief complaint within the history of present illness.  Patient denies fevers or chills.  Assessment & Plan: Visit Diagnoses:  1. Trigger finger, right index finger     Plan: Patient is a 74 year old male presents complaining of right index trigger finger.  He has a history of multiple trigger fingers.  He has had issues with this anger in the past.  It was previously injected only 1 time in October 2019.  He wants to have surgery to release the A1 pulley but he is not able to have surgery until August due to being out of town.  Plan for right index trigger finger injection under ultrasound guidance today.  Patient tolerated procedure well.  This will be the last injection we can do to the right index finger.  If symptoms recur the only option will be but with date of surgery from here on out.  Patient agrees with plan.  Also try using Voltaren gel topically.  Follow-up as needed.  Follow-Up Instructions: No follow-ups on file.   Orders:  No orders of the defined types  were placed in this encounter.  No orders of the defined types were placed in this encounter.     Procedures: Hand/UE Inj: R index A1 for trigger finger on 06/13/2020 1:22 PM Indications: therapeutic Details: 25 G needle, ultrasound-guided volar approach Medications: 0.33 mL bupivacaine 0.25 %; 13.33 mg methylPREDNISolone acetate 40 MG/ML; 3 mL lidocaine 1 % Outcome: tolerated well, no immediate complications Procedure, treatment alternatives, risks and benefits explained, specific risks discussed. Consent was given by the patient. Immediately prior to procedure a time out was called to verify the correct patient, procedure, equipment, support staff and site/side marked as required. Patient was prepped and draped in the usual sterile fashion.       Clinical Data: No additional findings.  Objective: Vital Signs: There were no vitals taken for this visit.  Physical Exam:  Constitutional: Patient appears well-developed HEENT:  Head: Normocephalic Eyes:EOM are normal Neck: Normal range of motion Cardiovascular: Normal rate Pulmonary/chest: Effort normal Neurologic: Patient is alert Skin: Skin is warm Psychiatric: Patient has normal mood and affect  Ortho Exam:  Sensation intact throughout all fingers of the right hand.  Tenderness to palpation over the A1 pulley of the right index finger.  No subluxation of extensor tendon  noted.  Triggering observed and felt at the A1 pulley.  Patient able to manually reduce the trigger finger by the fingers own power.  Specialty Comments:  No specialty comments available.  Imaging: No results found.   PMFS History: Patient Active Problem List   Diagnosis Date Noted  . Shoulder arthritis 05/23/2017  . Hereditary and idiopathic peripheral neuropathy 03/24/2015  . 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 Pain of right heel 12/18/2012   Past Medical History:  Diagnosis Date  . Anxiety   . Arthritis   .  BPV (benign positional vertigo)   . Depression   . GERD (gastroesophageal reflux disease)   . Neuromuscular disorder ([place])   . Panic disorder   . PONV (postoperative nausea and vomiting)    chills and anxiety after surgery    Family History  Problem Relation Age of Onset  . Diabetes Mother   . Alzheimer's disease Sister   . Acute myelogenous leukemia Unknown   . Neuropathy Neg Hx     Past Surgical History:  Procedure Laterality Date  . ANAL FISSURE REPAIR  1993  . APPENDECTOMY  1969  . 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 BACK SURGERY  2011   Stenosis, sciatica  . BACK SURGERY  December 2014  . CERVICAL SPINE SURGERY  Feb. 2014  . CERVICAL SPINE SURGERY  07/23/15   fusion  . COLONOSCOPY    . KNEE SURGERY     2x- cartilage  . left foot surgery  1985  . Left shoulder surgery  1990  . Right shoulder surgery  2012  . TOTAL KNEE ARTHROPLASTY  January 2011  . TOTAL KNEE ARTHROPLASTY  Feb. 2012  . TOTAL SHOULDER ARTHROPLASTY Right 05/23/2017   Procedure: RIGHT TOTAL SHOULDER ARTHROPLASTY;  Surgeon: Meredith Pel, MD;  Location: [facility];  Service: Orthopedics;  Laterality: Right;  Marland Kitchen VASECTOMY     Social History   Occupational History  . Occupation: Retired  Tobacco Use  . Smoking status: Never Smoker  . Smokeless tobacco: Never Used  Vaping Use  . Vaping Use: Never used  Substance and Sexual Activity  . Alcohol use: No    Alcohol/week: 0.0 standard drinks  . Drug use: No  . Sexual activity: Not on file

## 2020-06-13 ENCOUNTER — Encounter: Payer: Self-pay | Admitting: Orthopedic Surgery

## 2020-06-13 DIAGNOSIS — M65321 Trigger finger, right index finger: Secondary | ICD-10-CM | POA: Diagnosis not present

## 2020-06-13 MED ORDER — METHYLPREDNISOLONE ACETATE 40 MG/ML IJ SUSP
13.3300 mg | INTRAMUSCULAR | Status: AC | PRN
  Administered 2020-06-13: 13.33 mg

## 2020-06-13 MED ORDER — LIDOCAINE HCL 1 % IJ SOLN
3.0000 mL | INTRAMUSCULAR | Status: AC | PRN
  Administered 2020-06-13: 3 mL

## 2020-06-13 MED ORDER — BUPIVACAINE HCL 0.25 % IJ SOLN
0.3300 mL | INTRAMUSCULAR | Status: AC | PRN
  Administered 2020-06-13: .33 mL

## 2020-12-09 ENCOUNTER — Ambulatory Visit: Payer: Medicare PPO | Admitting: Orthopedic Surgery

## 2020-12-09 DIAGNOSIS — M65341 Trigger finger, right ring finger: Secondary | ICD-10-CM

## 2020-12-09 DIAGNOSIS — M65321 Trigger finger, right index finger: Secondary | ICD-10-CM

## 2020-12-09 DIAGNOSIS — M65331 Trigger finger, right middle finger: Secondary | ICD-10-CM

## 2020-12-13 ENCOUNTER — Encounter: Payer: Self-pay | Admitting: Orthopedic Surgery

## 2020-12-13 DIAGNOSIS — M65341 Trigger finger, right ring finger: Secondary | ICD-10-CM | POA: Diagnosis not present

## 2020-12-13 MED ORDER — LIDOCAINE HCL 1 % IJ SOLN
3.0000 mL | INTRAMUSCULAR | Status: AC | PRN
  Administered 2020-12-13: 3 mL

## 2020-12-13 MED ORDER — BUPIVACAINE HCL 0.25 % IJ SOLN
0.3300 mL | INTRAMUSCULAR | Status: AC | PRN
  Administered 2020-12-13: .33 mL

## 2020-12-13 MED ORDER — METHYLPREDNISOLONE ACETATE 40 MG/ML IJ SUSP
13.3300 mg | INTRAMUSCULAR | Status: AC | PRN
  Administered 2020-12-13: 13.33 mg

## 2020-12-13 NOTE — Progress Notes (Signed)
Office Visit Note   Patient: George Freeman           Date of Birth: 1946-10-01           MRN: [id number] Visit Date: 12/09/2020 Requested by: Tracey Harries, MD [address] [place],  [place] [address] PCP: Tracey Harries, MD  Subjective: Chief Complaint  Patient presents with  . Other     Trigger finger    HPI: George Freeman is a 74 year old patient with right ring trigger finger.  He has had prior injection in the finger before he thinks.  His old chart is reviewed and he has had right middle and right index finger injection.  He is right-hand dominant.  The ring finger locks at night.  Had right index trigger finger injection in June of this year.              ROS: All systems reviewed are negative as they relate to the chief complaint within the history of present illness.  Patient denies  fevers or chills.   Assessment & Plan: Visit Diagnoses:  1. Trigger finger, right index finger   2. Trigger finger, right middle finger   3. Trigger finger, right ring finger     Plan: Impression is several month history of symptomatic right ring trigger finger.  Ultrasound-guided injection performed today.  Give that about a 50% chance of helping.  Follow-up with me as needed.  May need release if this does not help.  Follow-Up Instructions: Return if symptoms worsen or fail to improve.   Orders:  No orders of the defined types were placed in this encounter.  No orders of the defined types were placed in this encounter.     Procedures: Hand/UE Inj: R ring A2 for trigger finger on 12/13/2020 11:13 PM Indications: therapeutic Details: 25 G needle, ultrasound-guided volar approach Medications: 0.33 mL bupivacaine 0.25 %; 13.33 mg methylPREDNISolone acetate 40 MG/ML; 3 mL lidocaine 1 % Outcome: tolerated well, no immediate complications Procedure, treatment alternatives, risks and benefits explained, specific risks discussed. Consent was given by the patient. Immediately  prior to procedure a time out was called to verify the correct patient, procedure, equipment, support staff and site/side marked as required. Patient was prepped and draped in the usual sterile fashion.       Clinical Data: No additional findings.  Objective: Vital Signs: There were no vitals taken for this visit.  Physical Exam:   Constitutional: Patient appears well-developed HEENT:  Head: Normocephalic Eyes:EOM are normal Neck: Normal range of motion Cardiovascular: Normal rate Pulmonary/chest: Effort normal Neurologic: Patient is alert Skin: Skin is warm Psychiatric: Patient has normal mood and affect    Ortho Exam: Ortho exam demonstrates tenderness of the A1 pulley right ring finger.  Some stenosing tenosynovitis is visible with flexion and extension.  Collaterals are stable.  Motor or sensory function to the finger is intact.  Full range of motion MCP PIP and DIP joints at that finger.  None of the other fingers have any type of stenosing or triggering.  Specialty Comments:  No specialty comments available.  Imaging: No results found.   PMFS History: Patient Active Problem List   Diagnosis Date Noted  . Shoulder arthritis 05/23/2017  . Hereditary and idiopathic peripheral neuropathy 03/24/2015  . 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 Pain of right heel 12/18/2012   Past Medical History:  Diagnosis Date  . Anxiety   . Arthritis   . BPV (  benign positional vertigo)   . Depression   . GERD (gastroesophageal reflux disease)   . Neuromuscular disorder (H[facility])   . Panic disorder   . PONV (postoperative nausea and vomiting)    chills and anxiety after surgery    Family History  Problem Relation Age of Onset  . Diabetes Mother   . Alzheimer's disease Sister   . Acute myelogenous leukemia Unknown   . Neuropathy Neg Hx     Past Surgical History:  Procedure Laterality Date  . ANAL FISSURE REPAIR  1993  . APPENDECTOMY  1969  .  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 BACK SURGERY  2011   Stenosis, sciatica  . BACK SURGERY  December 2014  . CERVICAL SPINE SURGERY  Feb. 2014  . CERVICAL SPINE SURGERY  07/23/15   fusion  . COLONOSCOPY    . KNEE SURGERY     2x- cartilage  . left foot surgery  1985  . Left shoulder surgery  1990  . Right shoulder surgery  2012  . TOTAL KNEE ARTHROPLASTY  January 2011  . TOTAL KNEE ARTHROPLASTY  Feb. 2012  . TOTAL SHOULDER ARTHROPLASTY Right 05/23/2017   Procedure: RIGHT TOTAL SHOULDER ARTHROPLASTY;  Surgeon: Cammy Copa, MD;  Location: [facility] OR;  Service: Orthopedics;  Laterality: Right;  Marland Kitchen VASECTOMY     Social History   Occupational History  . Occupation: Retired  Tobacco Use  . Smoking status: Never Smoker  . Smokeless tobacco: Never Used  Vaping Use  . Vaping Use: Never used  Substance and Sexual Activity  . Alcohol use: No    Alcohol/week: 0.0 standard drinks  . Drug use: No  . Sexual activity: Not on file

## 2022-02-17 ENCOUNTER — Other Ambulatory Visit: Payer: Self-pay

## 2022-02-17 ENCOUNTER — Encounter: Payer: Self-pay | Admitting: Physical Therapy

## 2022-02-17 ENCOUNTER — Ambulatory Visit: Payer: Medicare PPO | Attending: Family Medicine | Admitting: Physical Therapy

## 2022-02-17 DIAGNOSIS — R42 Dizziness and giddiness: Secondary | ICD-10-CM | POA: Insufficient documentation

## 2022-02-17 NOTE — Therapy (Signed)
[facility] [address] [place], [place], [address] Phone: [phone]   Fax:  [phone]  Physical Therapy Treatment  Patient Details  Name: George Freeman MRN: [id number] Date of Birth: July 01, 1946 Referring Provider (PT): Bouska   Encounter Date: 02/17/2022   PT End of Session - 02/17/22 1458     Visit Number 1    Number of Visits 13    Date for PT Re-Evaluation 05/17/22    Authorization Type [employer]    PT Start Time 1355    PT Stop Time 1440    PT Time Calculation (min) 45 min    Activity Tolerance Patient tolerated treatment well    Behavior During Therapy [facility] for tasks assessed/performed             Past Medical History:  Diagnosis Date   Anxiety    Arthritis    BPV (benign positional vertigo)    Depression    GERD (gastroesophageal reflux disease)    Neuromuscular disorder (HCC)    Panic disorder    PONV (postoperative nausea and vomiting)    chills and anxiety after surgery    Past Surgical History:  Procedure Laterality Date   ANAL FISSURE REPAIR  1993   APPENDECTOMY  1969   BACK SURGERY  2009   Stenosis, sciatica    BACK SURGERY  2011   Stenosis, sciatica   BACK SURGERY  December 2014   CERVICAL SPINE SURGERY  Feb. 2014   CERVICAL SPINE SURGERY  07/23/15   fusion   COLONOSCOPY     KNEE SURGERY     2x- cartilage   left foot surgery  1985   Left shoulder surgery  1990   Right shoulder surgery  2012   TOTAL KNEE ARTHROPLASTY  January 2011   TOTAL KNEE ARTHROPLASTY  Feb. 2012   TOTAL SHOULDER ARTHROPLASTY Right 05/23/2017   Procedure: RIGHT TOTAL SHOULDER ARTHROPLASTY;  Surgeon: Cammy Copa, MD;  Location: [facility] OR;  Service: Orthopedics;  Laterality: Right;   VASECTOMY      There were no vitals filed for this visit.   Subjective Assessment - 02/17/22 1400     Subjective Patient reports that he was travelling about a month ago in [place] and started having some vertigo symptoms, he  has had this in the past and his wife has been able to hjelp but not this time, reports that still dizziy at times    Patient Stated Goals have less dizziness    Currently in Pain? No/denies                [facility] PT Assessment - 02/17/22 0001       Assessment   Medical Diagnosis vertigo    Referring Provider (PT) Bouska    Onset Date/Surgical Date 02/03/22    Prior Therapy for back, shoulder and vertigo      Balance Screen   Has the patient fallen in the past 6 months No    Has the patient had a decrease in activity level because of a fear of falling?  No    Is the patient reluctant to leave their home because of a fear of falling?  No      Home Environment   Additional Comments single story, light housework      Prior Function   Level of Independence Independent    Vocation Retired    Leisure about 4-5 x/week  Vestibular Assessment - 02/17/22 0001       Symptom Behavior   Type of Dizziness  "World moves";Vertigo    Frequency of Dizziness constant    Aggravating Factors Rolling to right;Rolling to left    Relieving Factors Closing eyes;Slow movements;Avoiding busy/distracting areas      Oculomotor Exam   Oculomotor Alignment Normal    Smooth Pursuits Intact    Saccades Intact      Positional Testing   Dix-Hallpike Dix-Hallpike Right;Dix-Hallpike Left      Dix-Hallpike Right   Dix-Hallpike Right Duration no reaction      Dix-Hallpike Left   Dix-Hallpike Left Duration no reaction                               PT Education - 02/17/22 1457     Education Details HEP for Bradt Daroff and some VOR exercises    Person(s) Educated Patient    Methods Explanation;Demonstration;Handout    Comprehension Verbalized understanding              PT Short Term Goals - 02/17/22 1505       PT SHORT TERM GOAL #1   Title independent with initial HEP    Time 2    Period Weeks    Status New                PT Long Term Goals - 02/17/22 1506       PT LONG TERM GOAL #1   Title decrease dizziness 50%    Time 12    Status New      PT LONG TERM GOAL #2   Title understand proper posture and body mechanics    Time 12    Period Weeks    Status New      PT LONG TERM GOAL #3   Title independent with self care    Time 12    Period Weeks    Status New                   Plan - 02/17/22 1500     Clinical Impression Statement Patient reports that he was travelling in [place] last month and started having some dizziness issues, he has had this in the past and I helped him with Epley and educated his wife, he reports that he has been doing well with self treatment but this time it has not gone away, reports mild to moderate dizziness, worse with looking up and getting up in the morning.  Epley manuever were negative for both left and right.  I tried the horizontal canal but no symptoms here today.  I gave him handouts for Brand-Daroff and some VOR exercises.    Clinical Decision Making Low    Rehab Potential Good    PT Frequency 1x / week    PT Duration 12 weeks    PT Treatment/Interventions ADLs/Self Care Home Management;Balance training;Neuromuscular re-education;Visual/perceptual remediation/compensation;Canalith Repostioning    PT Next Visit Plan he does have some vacation coming up    Consulted and Agree with Plan of Care Patient             Patient will benefit from skilled therapeutic intervention in order to improve the following deficits and impairments:  Difficulty walking, Dizziness, Decreased balance  Visit Diagnosis: Dizziness and giddiness - Plan: PT plan of care cert/re-cert     Problem List Patient Active Problem List   Diagnosis  Date Noted   Shoulder arthritis 05/23/2017   Hereditary and idiopathic peripheral neuropathy 03/24/2015   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 [place], [place], [address] Phone: [phone]   Fax:  [phone]  Name: George Freeman MRN: [id number] Date of Birth: 17-May-1946

## 2022-02-21 ENCOUNTER — Encounter: Payer: Self-pay | Admitting: Physical Therapy

## 2022-02-21 ENCOUNTER — Other Ambulatory Visit: Payer: Self-pay

## 2022-02-21 ENCOUNTER — Ambulatory Visit: Payer: Medicare PPO | Admitting: Physical Therapy

## 2022-02-21 DIAGNOSIS — R42 Dizziness and giddiness: Secondary | ICD-10-CM

## 2022-02-21 NOTE — Therapy (Signed)
[place] [place], [place], [address] Phone: [phone]   Fax:  [phone]  Physical Therapy Treatment  Patient Details  Name: MENASHE KAFER MRN: [id number] Date of Birth: 26-Jun-1946 Referring Provider (PT): Bouska   Encounter Date: 02/21/2022   PT End of Session - 02/21/22 1220     Visit Number 2    Number of Visits 13    Date for PT Re-Evaluation 05/17/22    Authorization Type [employer]    PT Start Time 1140    PT Stop Time 1230    PT Time Calculation (min) 50 min    Activity Tolerance Patient tolerated treatment well    Behavior During Therapy [facility] for tasks assessed/performed             Past Medical History:  Diagnosis Date   Anxiety    Arthritis    BPV (benign positional vertigo)    Depression    GERD (gastroesophageal reflux disease)    Neuromuscular disorder ([place])    Panic disorder    PONV (postoperative nausea and vomiting)    chills and anxiety after surgery    Past Surgical History:  Procedure Laterality Date   [place]  2009   Stenosis, sciatica    BACK SURGERY  2011   Stenosis, sciatica   BACK SURGERY  December 2014   CERVICAL SPINE SURGERY  Feb. 2014   [place]  07/23/15   fusion   COLONOSCOPY     KNEE SURGERY     2x- cartilage   left foot surgery  1985   Left shoulder surgery  1990   Right shoulder surgery  2012   TOTAL KNEE ARTHROPLASTY  January 2011   TOTAL KNEE ARTHROPLASTY  Feb. 2012   TOTAL SHOULDER ARTHROPLASTY Right 05/23/2017   Procedure: RIGHT TOTAL SHOULDER ARTHROPLASTY;  Surgeon: Meredith Pel, MD;  Location: [facility];  Service: Orthopedics;  Laterality: Right;   VASECTOMY      There were no vitals filed for this visit.   Subjective Assessment - 02/21/22 1138     Subjective Patient reports that after visit on Thursday he had a good day Friday but had issues on Saturday and then had a  positive Epley on sunday.  Reports that he is pretty acute right now    Currently in Pain? No/denies                     Vestibular Assessment - 02/21/22 0001       Positional Testing   Horizontal Canal Testing Horizontal Canal Left;Horizontal Canal Left Intensity      Dix-Hallpike Right   Dix-Hallpike Right Duration no reaction      Dix-Hallpike Left   Dix-Hallpike Left Duration no reaction      Horizontal Canal Left   Horizontal Canal Left Duration 20 seconds    Horizontal Canal Left Symptoms Nystagmus      Horizontal Canal Left Intensity   Horizontal Canal Left Intensity Severe                      [facility] Adult PT Treatment/Exercise - 02/21/22 0001       Self-Care   Self-Care Other Self-Care Comments    Other Self-Care Comments  after Epley care and after Horizontal canal  care  PT Education - 02/21/22 1220     Education Details emailed patient left horizontal canal BBQ roll    Person(s) Educated Patient    Methods Explanation;Demonstration;Handout    Comprehension Verbalized understanding              PT Short Term Goals - 02/21/22 1258       PT SHORT TERM GOAL #1   Title independent with initial HEP    Status Partially Met               PT Long Term Goals - 02/17/22 1506       PT LONG TERM GOAL #1   Title decrease dizziness 50%    Time 12    Status New      PT LONG TERM GOAL #2   Title understand proper posture and body mechanics    Time 12    Period Weeks    Status New      PT LONG TERM GOAL #3   Title independent with self care    Time 12    Period Weeks    Status New                   Plan - 02/21/22 1222     Clinical Impression Statement Patient reports that he started having vertigo again on Saturday, they tried home Epely with minimal changes.  I did a left horizontal roll with him, he was positive with left horizontal canal , I did the left BBQ rool x 2 trying  to wait our the nystagmus at each part of the maneuver.  This seemed to help and he left with minimal issues, sent him a link to the left horizontal roll manuever.  Instructed in after care.    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 [place], [place], [address] Phone: [phone]   Fax:  [phone]  Name: JOSHIA KITCHINGS MRN: [id number] Date of Birth: November 18, 1946

## 2022-02-23 ENCOUNTER — Ambulatory Visit: Payer: Medicare PPO | Attending: Family Medicine | Admitting: Physical Therapy

## 2022-02-23 ENCOUNTER — Encounter: Payer: Self-pay | Admitting: Physical Therapy

## 2022-02-23 ENCOUNTER — Other Ambulatory Visit: Payer: Self-pay

## 2022-02-23 DIAGNOSIS — R42 Dizziness and giddiness: Secondary | ICD-10-CM | POA: Diagnosis not present

## 2022-02-23 NOTE — Therapy (Signed)
[facility] [place] ?[place], [place], [address] ?Phone: [phone]   Fax:  [phone] ? ?Physical Therapy Treatment ? ?Patient Details  ?Name: George Freeman ?MRN: [id number] ?Date of Birth: 1946/12/01 ?Referring Provider (PT): Bouska ? ? ?Encounter Date: 02/23/2022 ? ? PT End of Session - 02/23/22 1139   ? ? Visit Number 3   ? Number of Visits 13   ? Date for PT Re-Evaluation 05/17/22   ? Authorization Type [employer]   ? PT Start Time 1057   ? PT Stop Time 8786   ? PT Time Calculation (min) 35 min   ? Activity Tolerance Patient tolerated treatment well   ? Behavior During Therapy [facility] for tasks assessed/performed   ? ?  ?  ? ?  ? ? ?Past Medical History:  ?Diagnosis Date  ? Anxiety   ? Arthritis   ? BPV (benign positional vertigo)   ? Depression   ? GERD (gastroesophageal reflux disease)   ? Neuromuscular disorder ([place])   ? Panic disorder   ? PONV (postoperative nausea and vomiting)   ? chills and anxiety after surgery  ? ? ?Past Surgical History:  ?Procedure Laterality Date  ? [place]  ? APPENDECTOMY  1969  ? BACK SURGERY  2009  ? Stenosis, sciatica   ? BACK SURGERY  2011  ? Stenosis, sciatica  ? BACK SURGERY  December 2014  ? CERVICAL SPINE SURGERY  Feb. 2014  ? CERVICAL SPINE SURGERY  07/23/15  ? fusion  ? COLONOSCOPY    ? KNEE SURGERY    ? 2x- cartilage  ? left foot surgery  1985  ? Left shoulder surgery  1990  ? Right shoulder surgery  2012  ? TOTAL KNEE ARTHROPLASTY  January 2011  ? TOTAL KNEE ARTHROPLASTY  Feb. 2012  ? TOTAL SHOULDER ARTHROPLASTY Right 05/23/2017  ? Procedure: RIGHT TOTAL SHOULDER ARTHROPLASTY;  Surgeon: Meredith Pel, MD;  Location: [facility];  Service: Orthopedics;  Laterality: Right;  ? VASECTOMY    ? ? ?There were no vitals filed for this visit. ? ? Subjective Assessment - 02/23/22 1135   ? ? Subjective Patient reports feeling yucky after the Monday visit, but reports had a great day yesterday, no symptoms, reports  that he slept on his stomach last night and when he got up to go to the bathroom he got dizzy and it remained ,   ? ?  ?  ? ?  ? ? ? ? ? ? ? ? ? ? ? ? ? ? ? ? ? ? ? ? [facility] Adult PT Treatment/Exercise - 02/23/22 0001   ? ?  ? Self-Care  ? Other Self-Care Comments  reviewd the after care again and that the testing today was negative, explained that he and wife should try the maneuver during his symptoms.  he reports that after lsat night his symptoms seemed to be like the old kind when he had BPPV, we talked abut the possiblity of having both canals affected .   ? ?  ?  ? ?  ? ? ? ? ? ? ? ? ? ? ? ? PT Short Term Goals - 02/23/22 1142   ? ?  ? PT SHORT TERM GOAL #1  ? Title independent with initial HEP   ? Status Partially Met   ? ?  ?  ? ?  ? ? ? ? PT Long Term Goals - 02/17/22 1506   ? ?  ?  PT LONG TERM GOAL #1  ? Title decrease dizziness 50%   ? Time 12   ? Status New   ?  ? PT LONG TERM GOAL #2  ? Title understand proper posture and body mechanics   ? Time 12   ? Period Weeks   ? Status New   ?  ? PT LONG TERM GOAL #3  ? Title independent with self care   ? Time 12   ? Period Weeks   ? Status New   ? ?  ?  ? ?  ? ? ? ? ? ? ? ? Plan - 02/23/22 1140   ? ? Clinical Impression Statement I performed dix halpike on both sides and I checked horizontal canal test on both sides, he had no nystagmus and reported no dizziness,  WE reviewded the horizontal canal meneuver and he reported understanding and has a handout and video on it.  Advised to be easy today due to the 4 manuevers that we did and sleep in chair tonight   ? PT Next Visit Plan patient is going out of town next Wednesday, asked him to call us if he had more symptoms,   ? Consulted and Agree with Plan of Care Patient   ? ?  ?  ? ?  ? ? ?Patient will benefit from skilled therapeutic intervention in order to improve the following deficits and impairments:  Difficulty walking, Dizziness, Decreased balance ? ?Visit Diagnosis: ?Dizziness and  giddiness ? ? ? ? ?Problem List ?Patient Active Problem List  ? Diagnosis Date Noted  ? Shoulder arthritis 05/23/2017  ? Hereditary and idiopathic peripheral neuropathy 03/24/2015  ? 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 Pain of right heel 12/18/2012  ? ? Sumner Boast, PT ?02/23/2022, 11:44 AM ? ?[facility] [address] ?[address] ?[place], [place], [address] ?Phone: [phone]   Fax:  [phone] ? ?Name: George Freeman ?MRN: [id number] ?Date of Birth: [birth year] ? ? ? ?

## 2022-03-15 ENCOUNTER — Encounter: Payer: Medicare PPO | Admitting: Physical Therapy

## 2022-03-29 ENCOUNTER — Encounter: Payer: Medicare PPO | Admitting: Physical Therapy

## 2022-08-10 ENCOUNTER — Ambulatory Visit: Payer: Medicare PPO | Admitting: Orthopedic Surgery

## 2022-09-26 ENCOUNTER — Encounter: Payer: Self-pay | Admitting: Physical Therapy

## 2022-09-26 ENCOUNTER — Ambulatory Visit: Payer: Medicare PPO | Attending: Physical Medicine and Rehabilitation | Admitting: Physical Therapy

## 2022-09-26 DIAGNOSIS — R42 Dizziness and giddiness: Secondary | ICD-10-CM | POA: Insufficient documentation

## 2022-09-26 DIAGNOSIS — M5459 Other low back pain: Secondary | ICD-10-CM | POA: Diagnosis not present

## 2022-09-26 DIAGNOSIS — M62830 Muscle spasm of back: Secondary | ICD-10-CM | POA: Insufficient documentation

## 2022-09-26 NOTE — Therapy (Signed)
OUTPATIENT PHYSICAL THERAPY THORACOLUMBAR EVALUATION   Patient Name: George Freeman MRN: [id number] DOB:06-27-1946, 76 y.o., male Today's Date: 09/26/2022   PT End of Session - 09/26/22 1310     Visit Number 1    Number of Visits 13    Date for PT Re-Evaluation 12/21/22    Authorization Type [employer]    PT Start Time 1308    PT Stop Time 1400    PT Time Calculation (min) 52 min    Activity Tolerance Patient tolerated treatment well    Behavior During Therapy WFL for tasks assessed/performed             Past Medical History:  Diagnosis Date   Anxiety    Arthritis    BPV (benign positional vertigo)    Depression    GERD (gastroesophageal reflux disease)    Neuromuscular disorder (HCC)    Panic disorder    PONV (postoperative nausea and vomiting)    chills and anxiety after surgery   Past Surgical History:  Procedure Laterality Date   [place]  2009   Stenosis, sciatica    BACK SURGERY  2011   Stenosis, sciatica   BACK SURGERY  December 2014   CERVICAL SPINE SURGERY  Feb. 2014   CERVICAL SPINE SURGERY  07/23/15   fusion   COLONOSCOPY     KNEE SURGERY     2x- cartilage   left foot surgery  1985   Left shoulder surgery  1990   Right shoulder surgery  2012   TOTAL KNEE ARTHROPLASTY  January 2011   TOTAL KNEE ARTHROPLASTY  Feb. 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 02/12/2013   Pain of right heel 12/18/2012    PCP: Coletta Memos  REFERRING PROVIDER: Coletta Memos  REFERRING DIAG: LBP  Rationale for Evaluation and Treatment  Rehabilitation  THERAPY DIAG:  Other low back pain  Muscle spasm of back  ONSET DATE: August 2023  SUBJECTIVE:                                                                                                                                                                                           SUBJECTIVE STATEMENT: Patient reports that after a  vacation he started feeling pain in the back, right buttock and right thigh, reports that he had to start using a cane and difficulty walking  MRI showed he has fusion L4-5, very arthritic and stenosis with visible nerve impingement, he underwent an injection in the back and reports that this has helped.   PERTINENT HISTORY:  6 past lumbar surgeries  PAIN:  Are you having pain? Yes: NPRS scale: 3/10 Pain location: back and abdomen Pain description: tight, sore Aggravating factors: first thing in the AM, standing up straight  after sitting pain could be up to 8-9/10 Relieving factors: better as the day goes   PRECAUTIONS: None  WEIGHT BEARING RESTRICTIONS No  FALLS:  Has patient fallen in last 6 months? No  LIVING ENVIRONMENT: Lives with: lives with their family Lives in: House/apartment Stairs: No Has following equipment at home: None  OCCUPATION: retired  PLOF: Independent  PATIENT GOALS have less pain, walk better move better   OBJECTIVE:   DIAGNOSTIC FINDINGS:  MRI showed stenosis and nerve impingement  PATIENT SURVEYS:  FOTO 52  COGNITION:  Overall cognitive status: Within functional limits for tasks assessed     SENSATION: WFL  MUSCLE LENGTH: Hamstrings: Right 50 deg; Left 50 deg Thomas test: tight, pirformis tight  POSTURE: rounded shoulders, forward head, decreased lumbar lordosis, and flexed trunk   PALPATION: Mild soreness in the right buttock and the right quad, tight in the lumbar area  LUMBAR ROM:   Active  A/PROM  eval  Flexion Decreased 50%  Extension Decreased 100% with some pain  Right  lateral flexion Decreased 75%  Left lateral flexion Decreased 75%  Right rotation   Left rotation    (Blank rows = not tested)  LOWER EXTREMITY ROM:      LOWER EXTREMITY MMT:    MMT Right eval Left eval  Hip flexion 4 4+  Hip extension    Hip abduction 4 4  Hip adduction    Hip internal rotation    Hip external rotation    Knee flexion    Knee extension    Ankle dorsiflexion    Ankle plantarflexion    Ankle inversion    Ankle eversion     (Blank rows = not tested)  FUNCTIONAL TESTS:  5 times sit to stand: 25  GAIT: Distance walked: 100 feet Assistive device utilized: None Level of assistance: Complete Independence Comments: forward flexed trunk slight right lean     TODAY'S TREATMENT  HEP   PATIENT EDUCATION:  Education details: K2C, trunk rotation, HS, piriformis and quad stretches Person educated: Patient Education method: Explanation, Demonstration, Tactile cues, and Handouts Education comprehension: verbalized understanding   HOME EXERCISE PROGRAM: As above  ASSESSMENT:  CLINICAL IMPRESSION: Patient is a 76 y.o. male who was seen today for physical therapy evaluation and treatment for LBP and some difficulty walking, had back pain after a trip, reports that he had right thihg and buttock pain, it is better since an injection, still difficulty straightening up after sitting and in the AM.  Prior to the injection pain was up to 8-9/10 with walking and straightening up.   He is very tight in the quads, the HS and the piriformis, has some pain in the abdomen with straightening up but seems to be more tight hip flexor   OBJECTIVE IMPAIRMENTS Abnormal gait, decreased activity tolerance, decreased balance, decreased mobility, difficulty walking, decreased ROM, decreased strength, increased muscle spasms, impaired flexibility, improper body mechanics, postural dysfunction, and pain.   REHAB  POTENTIAL: Good  CLINICAL DECISION MAKING:  Stable/uncomplicated  EVALUATION COMPLEXITY: Low   GOALS: Goals reviewed with patient? Yes  SHORT TERM GOALS: Target date: 10/10/22  Independent with initial HEP Goal status: INITIAL  LONG TERM GOALS: Target date: 12/19/22  Understand posture and body mechanics Goal status: INITIAL  2.  Independent with advanced HEP and return to walking  Goal status: INITIAL  3.  Decrease pain 50% Goal status: INITIAL  4.  Increase lumbar ROM 25% Goal status: INITIAL  PLAN: PT FREQUENCY: 1-2x/week  PT DURATION: 12 weeks  PLANNED INTERVENTIONS: Therapeutic exercises, Therapeutic activity, Neuromuscular re-education, Balance training, Gait training, Patient/Family education, Self Care, Joint mobilization, Vestibular training, Canalith repositioning, Dry Needling, Electrical stimulation, Spinal mobilization, Cryotherapy, Moist heat, Taping, Traction, Ultrasound, and Manual therapy.  PLAN FOR NEXT SESSION: start gym activities   Jearld Lesch, PT 09/26/2022, 1:11 PM

## 2022-09-28 ENCOUNTER — Encounter: Payer: Self-pay | Admitting: Physical Therapy

## 2022-09-28 ENCOUNTER — Ambulatory Visit: Payer: Medicare PPO | Admitting: Physical Therapy

## 2022-09-28 DIAGNOSIS — M5459 Other low back pain: Secondary | ICD-10-CM

## 2022-09-28 DIAGNOSIS — R42 Dizziness and giddiness: Secondary | ICD-10-CM

## 2022-09-28 DIAGNOSIS — M62830 Muscle spasm of back: Secondary | ICD-10-CM

## 2022-09-28 NOTE — Therapy (Signed)
OUTPATIENT PHYSICAL THERAPY THORACOLUMBAR EVALUATION   Patient Name: George Freeman MRN: [id number] DOB:01-08-1946, 76 y.o., male Today's Date: 09/28/2022   PT End of Session - 09/28/22 0934     Visit Number 2    Number of Visits 13    Date for PT Re-Evaluation 12/21/22    PT Start Time 0930    PT Stop Time 1014    PT Time Calculation (min) 44 min    Activity Tolerance Patient tolerated treatment well    Behavior During Therapy WFL for tasks assessed/performed             Past Medical History:  Diagnosis Date   Anxiety    Arthritis    BPV (benign positional vertigo)    Depression    GERD (gastroesophageal reflux disease)    Neuromuscular disorder (HCC)    Panic disorder    PONV (postoperative nausea and vomiting)    chills and anxiety after surgery   Past Surgical History:  Procedure Laterality Date   ANAL FISSURE REPAIR  1993   APPENDECTOMY  1969   BACK SURGERY  2009   Stenosis, sciatica    BACK SURGERY  2011   Stenosis, sciatica   BACK SURGERY  December 2014   CERVICAL SPINE SURGERY  Feb. 2014   CERVICAL SPINE SURGERY  07/23/15   fusion   COLONOSCOPY     KNEE SURGERY     2x- cartilage   left foot surgery  1985   Left shoulder surgery  1990   Right shoulder surgery  2012   TOTAL KNEE ARTHROPLASTY  January 2011   TOTAL KNEE ARTHROPLASTY  Feb. 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 02/12/2013   Pain of right heel 12/18/2012    PCP: Everlene Other  REFERRING PROVIDER: Everlene Other  REFERRING DIAG: LBP  Rationale for Evaluation and Treatment Rehabilitation  THERAPY DIAG:  Other low back  pain  Muscle spasm of back  Dizziness and giddiness  ONSET DATE: August 2023  SUBJECTIVE:                                                                                                                                                                                           SUBJECTIVE STATEMENT: Feeling okay, I had a vaccine on  Monday and feel a little yucky PERTINENT HISTORY:  6 past lumbar surgeries  PAIN:  Are you having pain? Yes: NPRS scale: 3/10 Pain location: back and abdomen Pain description: tight, sore Aggravating factors: first thing in the AM, standing up straight  after sitting pain could be up to 8-9/10 Relieving factors: better as the day goes   PRECAUTIONS: None  WEIGHT BEARING RESTRICTIONS No  FALLS:  Has patient fallen in last 6 months? No  LIVING ENVIRONMENT: Lives with: lives with their family Lives in: House/apartment Stairs: No Has following equipment at home: None  OCCUPATION: retired  PLOF: Independent  PATIENT GOALS have less pain, walk better move better   OBJECTIVE:   DIAGNOSTIC FINDINGS:  MRI showed stenosis and nerve impingement  PATIENT SURVEYS:  FOTO 52  COGNITION:  Overall cognitive status: Within functional limits for tasks assessed     SENSATION: WFL  MUSCLE LENGTH: Hamstrings: Right 50 deg; Left 50 deg Thomas test: tight, pirformis tight  POSTURE: rounded shoulders, forward head, decreased lumbar lordosis, and flexed trunk   PALPATION: Mild soreness in the right buttock and the right quad, tight in the lumbar area  LUMBAR ROM:   Active  A/PROM  eval  Flexion Decreased 50%  Extension Decreased 100% with some pain  Right lateral flexion Decreased 75%  Left lateral flexion Decreased 75%  Right rotation   Left rotation    (Blank rows = not tested)  LOWER EXTREMITY ROM:      LOWER EXTREMITY MMT:    MMT Right eval Left eval  Hip flexion 4 4+  Hip extension    Hip abduction 4 4  Hip adduction    Hip  internal rotation    Hip external rotation    Knee flexion    Knee extension    Ankle dorsiflexion    Ankle plantarflexion    Ankle inversion    Ankle eversion     (Blank rows = not tested)  FUNCTIONAL TESTS:  5 times sit to stand: 25  GAIT: Distance walked: 100 feet Assistive device utilized: None Level of assistance: Complete Independence Comments: forward flexed trunk slight right lean     TODAY'S TREATMENT  09/28/22 Nustep level 5 x 6 minutes Seated row 20# 2x10 Lats 20# 2x10 Leg press 20# 2x10 2 laps walking outside 10# straight arm pulls 2x10 Feet on ball K2C, trunk rotation, small bridge, isometric abs   PATIENT EDUCATION:  Education details: K2C, trunk rotation, HS, piriformis and quad stretches Person educated: Patient Education method: Explanation, Demonstration, Tactile cues, and Handouts Education comprehension: verbalized understanding   HOME EXERCISE PROGRAM: As above  ASSESSMENT:  CLINICAL IMPRESSION: Started good strength and endurance activities, he had no issues, he is very tight in the LE's, needs cues to get core activation   OBJECTIVE IMPAIRMENTS Abnormal gait, decreased activity tolerance, decreased balance, decreased mobility, difficulty walking, decreased ROM, decreased strength, increased muscle spasms, impaired flexibility, improper body mechanics, postural dysfunction, and pain.   REHAB POTENTIAL: Good  CLINICAL DECISION MAKING: Stable/uncomplicated  EVALUATION COMPLEXITY: Low   GOALS: Goals reviewed with patient? Yes  SHORT TERM GOALS: Target date: 10/10/22  Independent with initial HEP Goal status: INITIAL  LONG TERM GOALS: Target date: 12/19/22  Understand posture and body mechanics Goal status: INITIAL  2.  Independent with advanced HEP and return to walking  Goal status: INITIAL  3.  Decrease pain 50% Goal status: INITIAL  4.  Increase lumbar ROM 25% Goal status: INITIAL  PLAN: PT FREQUENCY:  1-2x/week  PT DURATION: 12 weeks  PLANNED INTERVENTIONS: Therapeutic exercises, Therapeutic activity, Neuromuscular re-education, Balance training, Gait training, Patient/Family education, Self Care, Joint mobilization, Vestibular training, Canalith repositioning, Dry Needling, Electrical stimulation, Spinal mobilization, Cryotherapy, Moist heat, Taping, Traction, Ultrasound, and Manual therapy.  PLAN FOR NEXT SESSION: start gym activities   Sumner Boast, PT 09/28/2022, 9:35 AM

## 2022-09-29 ENCOUNTER — Encounter: Payer: Self-pay | Admitting: Physical Therapy

## 2022-09-29 ENCOUNTER — Ambulatory Visit: Payer: Medicare PPO | Admitting: Physical Therapy

## 2022-09-29 DIAGNOSIS — M5459 Other low back pain: Secondary | ICD-10-CM

## 2022-09-29 DIAGNOSIS — M62830 Muscle spasm of back: Secondary | ICD-10-CM

## 2022-09-29 NOTE — Therapy (Signed)
OUTPATIENT PHYSICAL THERAPY THORACOLUMBAR TREATMENT   Patient Name: George Freeman MRN: [id number] DOB:1946/03/01, 76 y.o., male Today's Date: 09/29/2022   PT End of Session - 09/29/22 1709     Visit Number 3    Number of Visits 13    Date for PT Re-Evaluation 12/21/22    Authorization Type [employer]    PT Start Time 1708    PT Stop Time 1800    PT Time Calculation (min) 52 min    Activity Tolerance Patient tolerated treatment well    Behavior During Therapy WFL for tasks assessed/performed             Past Medical History:  Diagnosis Date   Anxiety    Arthritis    BPV (benign positional vertigo)    Depression    GERD (gastroesophageal reflux disease)    Neuromuscular disorder (HCC)    Panic disorder    PONV (postoperative nausea and vomiting)    chills and anxiety after surgery   Past Surgical History:  Procedure Laterality Date   ANAL FISSURE REPAIR  1993   APPENDECTOMY  1969   BACK SURGERY  2009   Stenosis, sciatica    BACK SURGERY  2011   Stenosis, sciatica   BACK SURGERY  December 2014   CERVICAL SPINE SURGERY  Feb. 2014   CERVICAL SPINE SURGERY  07/23/15   fusion   COLONOSCOPY     KNEE SURGERY     2x- cartilage   left foot surgery  1985   Left shoulder surgery  1990   Right shoulder surgery  2012   TOTAL KNEE ARTHROPLASTY  January 2011   TOTAL KNEE ARTHROPLASTY  Feb. 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 Yes: NPRS scale: 3/10 Pain location: back and abdomen Pain description: tight, sore Aggravating factors: first thing in the AM, standing up straight  after sitting pain could be up to 8-9/10 Relieving factors: better as the day goes   PRECAUTIONS: None  WEIGHT BEARING RESTRICTIONS No  FALLS:  Has patient fallen in last 6 months? No  LIVING ENVIRONMENT: Lives with: lives with their family Lives in: House/apartment Stairs: No Has following equipment at home: None  OCCUPATION: retired  PLOF: Independent  PATIENT GOALS have less pain, walk better move better   OBJECTIVE:   DIAGNOSTIC FINDINGS:  MRI showed stenosis and nerve impingement  PATIENT SURVEYS:  FOTO 52  COGNITION:  Overall cognitive status: Within functional limits for tasks assessed     SENSATION: WFL  MUSCLE LENGTH: Hamstrings: Right 50 deg; Left 50 deg Thomas test: tight, pirformis tight  POSTURE: rounded shoulders, forward head, decreased lumbar lordosis, and flexed trunk   PALPATION: Mild soreness in the right buttock and the right quad, tight in the lumbar area  LUMBAR ROM:   Active  A/PROM  eval  Flexion Decreased 50%  Extension Decreased 100% with some pain  Right lateral flexion Decreased 75%  Left lateral flexion Decreased 75%  Right rotation   Left rotation    (Blank rows = not tested)  LOWER EXTREMITY ROM:      LOWER EXTREMITY MMT:    MMT Right eval Left eval  Hip flexion 4 4+  Hip extension    Hip abduction 4 4  Hip adduction     Hip internal rotation    Hip external rotation    Knee flexion    Knee extension    Ankle dorsiflexion    Ankle plantarflexion    Ankle inversion    Ankle eversion     (Blank rows = not tested)  FUNCTIONAL TESTS:  5 times sit to stand: 25  GAIT: Distance walked: 100 feet Assistive device utilized: None Level of assistance: Complete Independence Comments: forward flexed trunk slight right lean     TODAY'S TREATMENT  09/29/22 Nustep level 5 x 6 minutes Seated rows 20# Lats 20# 2x10 10# straight arm pulls Leg press 20# 2x10 Feet on ball K2C, trunk roation, small bridges and isometric abs Clams green tband Passive stretch to the LE's   09/28/22 Nustep level 5 x 6 minutes Seated row 20# 2x10 Lats 20# 2x10 Leg press 20# 2x10 2 laps walking outside 10# straight arm pulls 2x10 Feet on ball K2C, trunk rotation, small bridge, isometric abs   PATIENT EDUCATION:  Education details: K2C, trunk rotation, HS, piriformis and quad stretches Person educated: Patient Education method: Explanation, Demonstration, Tactile cues, and Handouts Education comprehension: verbalized understanding   HOME EXERCISE PROGRAM: As above  ASSESSMENT:  CLINICAL IMPRESSION: No problems with the current plan, he reports feeling better and walking better prior to coming in today, LE's are tight, needs some cues for core strength  OBJECTIVE IMPAIRMENTS Abnormal gait, decreased activity tolerance, decreased balance, decreased mobility, difficulty walking, decreased ROM, decreased strength, increased muscle spasms, impaired flexibility, improper body mechanics, postural dysfunction, and pain.   REHAB POTENTIAL: Good  CLINICAL DECISION MAKING: Stable/uncomplicated  EVALUATION COMPLEXITY: Low   GOALS: Goals reviewed with patient? Yes  SHORT TERM GOALS: Target date: 10/10/22  Independent with initial HEP Goal status: INITIAL  LONG TERM GOALS: Target date: 12/19/22  Understand posture  and  body mechanics Goal status: INITIAL  2.  Independent with advanced HEP and return to walking  Goal status: INITIAL  3.  Decrease pain 50% Goal status: INITIAL  4.  Increase lumbar ROM 25% Goal status: INITIAL  PLAN: PT FREQUENCY: 1-2x/week  PT DURATION: 12 weeks  PLANNED INTERVENTIONS: Therapeutic exercises, Therapeutic activity, Neuromuscular re-education, Balance training, Gait training, Patient/Family education, Self Care, Joint mobilization, Vestibular training, Canalith repositioning, Dry Needling, Electrical stimulation, Spinal mobilization, Cryotherapy, Moist heat, Taping, Traction, Ultrasound, and Manual therapy.  PLAN FOR NEXT SESSION: start gym activities   Sumner Boast, PT 09/29/2022, 5:10 PM

## 2022-10-03 ENCOUNTER — Encounter: Payer: Self-pay | Admitting: Physical Therapy

## 2022-10-03 ENCOUNTER — Ambulatory Visit: Payer: Medicare PPO | Admitting: Physical Therapy

## 2022-10-03 DIAGNOSIS — M62830 Muscle spasm of back: Secondary | ICD-10-CM

## 2022-10-03 DIAGNOSIS — M5459 Other low back pain: Secondary | ICD-10-CM | POA: Diagnosis not present

## 2022-10-03 NOTE — Therapy (Signed)
OUTPATIENT PHYSICAL THERAPY THORACOLUMBAR TREATMENT   Patient Name: George Freeman MRN: [id number] DOB:[birth year], 76 y.o., male Today's Date: 10/03/2022   PT End of Session - 10/03/22 1446     Visit Number 4    Number of Visits 13    Date for PT Re-Evaluation 11/21/22    Authorization Type [employer]    PT Start Time 1443    PT Stop Time 1528    PT Time Calculation (min) 45 min    Activity Tolerance Patient tolerated treatment well    Behavior During Therapy WFL for tasks assessed/performed             Past Medical History:  Diagnosis Date   Anxiety    Arthritis    BPV (benign positional vertigo)    Depression    GERD (gastroesophageal reflux disease)    Neuromuscular disorder (HCC)    Panic disorder    PONV (postoperative nausea and vomiting)    chills and anxiety after surgery   Past Surgical History:  Procedure Laterality Date   ANAL FISSURE REPAIR  1993   APPENDECTOMY  1969   BACK SURGERY  2009   Stenosis, sciatica    BACK SURGERY  2011   Stenosis, sciatica   BACK SURGERY  December 2014   CERVICAL SPINE SURGERY  Feb. 2014   CERVICAL SPINE SURGERY  07/23/15   fusion   COLONOSCOPY     KNEE SURGERY     2x- cartilage   left foot surgery  1985   Left shoulder surgery  1990   Right shoulder surgery  2012   TOTAL KNEE ARTHROPLASTY  January 2011   TOTAL KNEE ARTHROPLASTY  Feb. 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 02/12/2013   Pain of right heel 12/18/2012    PCP: Everlene Other  REFERRING PROVIDER: Everlene Other  REFERRING DIAG: LBP  Rationale for Evaluation and Treatment  Rehabilitation  THERAPY DIAG:  Other low back pain  Muscle spasm of back  ONSET DATE: August 2023  SUBJECTIVE:                                                                                                                                                                                           SUBJECTIVE STATEMENT: I started having some back  and abdominal pain, maybe I over did it, I rested and feel better PERTINENT HISTORY:  6 past lumbar surgeries  PAIN:  Are you having pain? Yes: NPRS scale: 3/10 Pain location: back and abdomen Pain description: tight, sore Aggravating factors: first thing in the AM, standing up straight  after sitting pain could be up to 8-9/10 Relieving factors: better as the day goes   PRECAUTIONS: None  WEIGHT BEARING RESTRICTIONS No  FALLS:  Has patient fallen in last 6 months? No  LIVING ENVIRONMENT: Lives with: lives with their family Lives in: House/apartment Stairs: No Has following equipment at home: None  OCCUPATION: retired  PLOF: Independent  PATIENT GOALS have less pain, walk better move better   OBJECTIVE:   DIAGNOSTIC FINDINGS:  MRI showed stenosis and nerve impingement  PATIENT SURVEYS:  FOTO 52  COGNITION:  Overall cognitive status: Within functional limits for tasks assessed     SENSATION: WFL  MUSCLE LENGTH: Hamstrings: Right 50 deg; Left 50 deg Thomas test: tight, pirformis tight  POSTURE: rounded shoulders, forward head, decreased lumbar lordosis, and flexed trunk   PALPATION: Mild soreness in the right buttock and the right quad, tight in the lumbar area  LUMBAR ROM:   Active  A/PROM  eval  Flexion Decreased 50%  Extension Decreased 100% with some pain  Right lateral flexion Decreased 75%  Left lateral flexion Decreased 75%  Right rotation   Left rotation    (Blank rows = not tested)  LOWER EXTREMITY ROM:      LOWER EXTREMITY MMT:    MMT Right eval Left eval  Hip flexion 4 4+  Hip  extension    Hip abduction 4 4  Hip adduction    Hip internal rotation    Hip external rotation    Knee flexion    Knee extension    Ankle dorsiflexion    Ankle plantarflexion    Ankle inversion    Ankle eversion     (Blank rows = not tested)  FUNCTIONAL TESTS:  5 times sit to stand: 25  GAIT: Distance walked: 100 feet Assistive device utilized: None Level of assistance: Complete Independence Comments: forward flexed trunk slight right lean     TODAY'S TREATMENT  10/03/22 Seated row 25# 2x15 Lats 25# 2x15 Leg press 40# 2x15 10# straight arm pulls WEighted ball overhead lift Feet on ball K2C, trunk rotation, small bridges, isometric abs PROM stretches of the LE's Towel thoracic extension   09/29/22 Nustep level 5 x 6 minutes Seated rows 20# Lats 20# 2x10 10# straight arm pulls Leg press 20# 2x10 Feet on ball K2C, trunk roation, small bridges and isometric abs Clams green tband Passive stretch to the LE's   09/28/22 Nustep level 5 x 6 minutes Seated row 20# 2x10 Lats 20# 2x10 Leg press 20# 2x10 2 laps walking outside 10# straight arm pulls 2x10 Feet on ball K2C, trunk rotation, small bridge, isometric abs   PATIENT EDUCATION:  Education details: K2C, trunk rotation, HS, piriformis and quad stretches Person educated: Patient Education method: Explanation, Demonstration, Tactile cues, and Handouts Education comprehension: verbalized understanding   HOME EXERCISE PROGRAM: As above  ASSESSMENT:  CLINICAL IMPRESSION: Had some increased pain in the abdomen on Saturday reports maybe over did it, he has had some abdomen pain in the past and it seems musculature in nature with hip flexor or iliopsoas.  OBJECTIVE IMPAIRMENTS Abnormal gait, decreased activity tolerance, decreased balance, decreased mobility, difficulty walking, decreased ROM, decreased strength, increased muscle spasms, impaired flexibility,  improper body mechanics, postural dysfunction, and  pain.   REHAB POTENTIAL: Good  CLINICAL DECISION MAKING: Stable/uncomplicated  EVALUATION COMPLEXITY: Low   GOALS: Goals reviewed with patient? Yes  SHORT TERM GOALS: Target date: 10/10/22  Independent with initial HEP Goal status: INITIAL  LONG TERM GOALS: Target date: 12/19/22  Understand posture and body mechanics Goal status: INITIAL  2.  Independent with advanced HEP and return to walking  Goal status: INITIAL  3.  Decrease pain 50% Goal status: INITIAL  4.  Increase lumbar ROM 25% Goal status: INITIAL  PLAN: PT FREQUENCY: 1-2x/week  PT DURATION: 12 weeks  PLANNED INTERVENTIONS: Therapeutic exercises, Therapeutic activity, Neuromuscular re-education, Balance training, Gait training, Patient/Family education, Self Care, Joint mobilization, Vestibular training, Canalith repositioning, Dry Needling, Electrical stimulation, Spinal mobilization, Cryotherapy, Moist heat, Taping, Traction, Ultrasound, and Manual therapy.  PLAN FOR NEXT SESSION: start gym activities   Sumner Boast, PT 10/03/2022, 2:47 PM

## 2022-10-05 ENCOUNTER — Ambulatory Visit: Payer: Medicare PPO | Admitting: Physical Therapy

## 2022-10-05 ENCOUNTER — Encounter: Payer: Self-pay | Admitting: Physical Therapy

## 2022-10-05 DIAGNOSIS — M5459 Other low back pain: Secondary | ICD-10-CM

## 2022-10-05 DIAGNOSIS — M62830 Muscle spasm of back: Secondary | ICD-10-CM

## 2022-10-05 NOTE — Therapy (Signed)
OUTPATIENT PHYSICAL THERAPY THORACOLUMBAR TREATMENT   Patient Name: George Freeman MRN: [id number] DOB:10/26/1946, 76 y.o., male Today's Date: 10/05/2022   PT End of Session - 10/05/22 1357     Visit Number 5    Number of Visits 13    Date for PT Re-Evaluation 11/21/22    Authorization Type [employer]    PT Start Time 1352    PT Stop Time 1440    PT Time Calculation (min) 48 min    Activity Tolerance Patient tolerated treatment well    Behavior During Therapy WFL for tasks assessed/performed             Past Medical History:  Diagnosis Date   Anxiety    Arthritis    BPV (benign positional vertigo)    Depression    GERD (gastroesophageal reflux disease)    Neuromuscular disorder (HCC)    Panic disorder    PONV (postoperative nausea and vomiting)    chills and anxiety after surgery   Past Surgical History:  Procedure Laterality Date   [place]  2009   Stenosis, sciatica    BACK SURGERY  2011   Stenosis, sciatica   BACK SURGERY  December 2014   CERVICAL SPINE SURGERY  Feb. 2014   CERVICAL SPINE SURGERY  07/23/15   fusion   COLONOSCOPY     KNEE SURGERY     2x- cartilage   left foot surgery  1985   Left shoulder surgery  1990   Right shoulder surgery  2012   TOTAL KNEE ARTHROPLASTY  January 2011   TOTAL KNEE ARTHROPLASTY  Feb. 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 PERTINENT  HISTORY:  6 past lumbar surgeries  PAIN:  Are you having pain? Yes: NPRS scale: 3/10 Pain location: back and abdomen Pain description: tight, sore Aggravating factors: first thing in the AM, standing up straight  after sitting pain could be up to 8-9/10 Relieving factors: better as the day goes   PRECAUTIONS: None  WEIGHT BEARING RESTRICTIONS No  FALLS:  Has patient fallen in last 6 months? No  LIVING ENVIRONMENT: Lives with: lives with their family Lives in: House/apartment Stairs: No Has following equipment at home: None  OCCUPATION: retired  PLOF: Independent  PATIENT GOALS have less pain, walk better move better   OBJECTIVE:   DIAGNOSTIC FINDINGS:  MRI showed stenosis and nerve impingement  PATIENT SURVEYS:  FOTO 52  COGNITION:  Overall cognitive status: Within functional limits for tasks assessed     SENSATION: WFL  MUSCLE LENGTH: Hamstrings: Right 50 deg; Left 50 deg Thomas test: tight, pirformis tight  POSTURE: rounded shoulders, forward head, decreased lumbar lordosis, and flexed trunk   PALPATION: Mild soreness in the right buttock and the right quad, tight in the lumbar area  LUMBAR ROM:   Active  A/PROM  eval  Flexion Decreased 50%  Extension Decreased 100% with some pain  Right lateral flexion Decreased 75%  Left lateral flexion Decreased 75%  Right rotation   Left rotation    (Blank rows = not tested)  LOWER EXTREMITY ROM:      LOWER EXTREMITY MMT:    MMT Right eval Left eval  Hip flexion 4 4+  Hip extension    Hip abduction 4 4  Hip adduction    Hip internal  rotation    Hip external rotation    Knee flexion    Knee extension    Ankle dorsiflexion    Ankle plantarflexion    Ankle inversion    Ankle eversion     (Blank rows = not tested)  FUNCTIONAL TESTS:  5 times sit to stand: 25  GAIT: Distance walked: 100 feet Assistive device utilized: None Level of assistance: Complete Independence Comments: forward flexed trunk slight right lean     TODAY'S TREATMENT  10/05/22 Leg curls 25# 2x10 Leg ext 5# 2x10 10# straight arms pulls Leg press 20# 2x10 Yellow tband hip abduction x10, hip extension 2x10 Feet on ball K2C, trunk rotation, small bridges and isometric abs LE stretches Seated rows 25# 2x10 Lats 25# 2x10 Thoracic extension over a towel  10/03/22 Seated row 25# 2x15 Lats 25# 2x15 Leg press 40# 2x15 10# straight arm pulls WEighted ball overhead lift Feet on ball K2C, trunk rotation, small bridges, isometric abs PROM stretches of the LE's Towel thoracic extension   09/29/22 Nustep level 5 x 6 minutes Seated rows 20# Lats 20# 2x10 10# straight arm pulls Leg press 20# 2x10 Feet on ball K2C, trunk roation, small bridges and isometric abs Clams green tband Passive stretch to the LE's   09/28/22 Nustep level 5 x 6 minutes Seated row 20# 2x10 Lats 20# 2x10 Leg press 20# 2x10 2 laps walking outside 10# straight arm pulls 2x10 Feet on ball K2C, trunk rotation, small bridge, isometric abs   PATIENT EDUCATION:  Education details: K2C, trunk rotation, HS, piriformis and quad stretches Person educated: Patient Education method: Explanation, Demonstration, Tactile cues, and Handouts Education comprehension: verbalized understanding   HOME EXERCISE PROGRAM: As above  ASSESSMENT:  CLINICAL IMPRESSION: Patient reports having a really good day, less pain and moving and walking better.  No problems with the exercises  except weak hips OBJECTIVE IMPAIRMENTS Abnormal gait, decreased activity tolerance, decreased  balance, decreased mobility, difficulty walking, decreased ROM, decreased strength, increased muscle spasms, impaired flexibility, improper body mechanics, postural dysfunction, and pain.   REHAB POTENTIAL: Good  CLINICAL DECISION MAKING: Stable/uncomplicated  EVALUATION COMPLEXITY: Low   GOALS: Goals reviewed with patient? Yes  SHORT TERM GOALS: Target date: 10/10/22  Independent with initial HEP Goal status: met  LONG TERM GOALS: Target date: 12/19/22  Understand posture and body mechanics Goal status: INITIAL  2.  Independent with advanced HEP and return to walking  Goal status: INITIAL  3.  Decrease pain 50% Goal status: INITIAL  4.  Increase lumbar ROM 25% Goal status: INITIAL  PLAN: PT FREQUENCY: 1-2x/week  PT DURATION: 12 weeks  PLANNED INTERVENTIONS: Therapeutic exercises, Therapeutic activity, Neuromuscular re-education, Balance training, Gait training, Patient/Family education, Self Care, Joint mobilization, Vestibular training, Canalith repositioning, Dry Needling, Electrical stimulation, Spinal mobilization, Cryotherapy, Moist heat, Taping, Traction, Ultrasound, and Manual therapy.  PLAN FOR NEXT SESSION: progress as tolerated   Daelan Gatt W, PT 10/05/2022, 1:58 PM

## 2022-10-10 ENCOUNTER — Ambulatory Visit: Payer: Medicare PPO | Admitting: Physical Therapy

## 2022-10-10 ENCOUNTER — Encounter: Payer: Self-pay | Admitting: Physical Therapy

## 2022-10-10 DIAGNOSIS — M62830 Muscle spasm of back: Secondary | ICD-10-CM

## 2022-10-10 DIAGNOSIS — M5459 Other low back pain: Secondary | ICD-10-CM

## 2022-10-10 NOTE — Therapy (Signed)
OUTPATIENT PHYSICAL THERAPY THORACOLUMBAR TREATMENT   Patient Name: George Freeman MRN: [id number] DOB:01-03-1946, 76 y.o., male Today's Date: 10/10/2022   PT End of Session - 10/10/22 0756     Visit Number 6    Number of Visits 13    Date for PT Re-Evaluation 11/21/22    Authorization Type [employer]    PT Start Time [phone]    PT Stop Time 0840    PT Time Calculation (min) 46 min    Activity Tolerance Patient tolerated treatment well    Behavior During Therapy WFL for tasks assessed/performed             Past Medical History:  Diagnosis Date   Anxiety    Arthritis    BPV (benign positional vertigo)    Depression    GERD (gastroesophageal reflux disease)    Neuromuscular disorder (HCC)    Panic disorder    PONV (postoperative nausea and vomiting)    chills and anxiety after surgery   Past Surgical History:  Procedure Laterality Date   [place]  2009   Stenosis, sciatica    BACK SURGERY  2011   Stenosis, sciatica   BACK SURGERY  December 2014   CERVICAL SPINE SURGERY  Feb. 2014   [place]  07/23/15   fusion   COLONOSCOPY     KNEE SURGERY     2x- cartilage   left foot surgery  1985   Left shoulder surgery  1990   Right shoulder surgery  2012   TOTAL KNEE ARTHROPLASTY  January 2011   TOTAL KNEE ARTHROPLASTY  Feb. 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 02/12/2013   Pain of right heel 12/18/2012    PCP: Coletta Memos  REFERRING PROVIDER: Coletta Memos  REFERRING DIAG: LBP  Rationale for Evaluation and Treatment  Rehabilitation  THERAPY DIAG:  Other low back pain  Muscle spasm of back  ONSET DATE: August 2023  SUBJECTIVE:                                                                                                                                                                                           SUBJECTIVE STATEMENT: Been doing well, having more  neck pain recently PERTINENT HISTORY:  6 past lumbar surgeries  PAIN:  Are you having pain? Yes: NPRS scale: 3/10 Pain location: back and abdomen Pain description: tight, sore Aggravating factors: first thing in the AM, standing up straight  after sitting pain could be up to 8-9/10 Relieving factors: better as the day goes   PRECAUTIONS: None  WEIGHT BEARING RESTRICTIONS No  FALLS:  Has patient fallen in last 6 months? No  LIVING ENVIRONMENT: Lives with: lives with their family Lives in: House/apartment Stairs: No Has following equipment at home: None  OCCUPATION: retired  PLOF: Independent  PATIENT GOALS have less pain, walk better move better   OBJECTIVE:   DIAGNOSTIC FINDINGS:  MRI showed stenosis and nerve impingement  PATIENT SURVEYS:  FOTO 52  COGNITION:  Overall cognitive status: Within functional limits for tasks assessed     SENSATION: WFL  MUSCLE LENGTH: Hamstrings: Right 50 deg; Left 50 deg Thomas test: tight, pirformis tight  POSTURE: rounded shoulders, forward head, decreased lumbar lordosis, and flexed trunk   PALPATION: Mild soreness in the right buttock and the right quad, tight in the lumbar area  LUMBAR ROM:   Active  A/PROM  eval  Flexion Decreased 50%  Extension Decreased 100% with some pain  Right lateral flexion Decreased 75%  Left lateral flexion Decreased 75%  Right rotation   Left rotation    (Blank rows = not tested)  LOWER EXTREMITY ROM:      LOWER EXTREMITY MMT:    MMT Right eval Left eval  Hip flexion 4 4+  Hip extension    Hip abduction 4 4  Hip adduction     Hip internal rotation    Hip external rotation    Knee flexion    Knee extension    Ankle dorsiflexion    Ankle plantarflexion    Ankle inversion    Ankle eversion     (Blank rows = not tested)  FUNCTIONAL TESTS:  5 times sit to stand: 25  GAIT: Distance walked: 100 feet Assistive device utilized: None Level of assistance: Complete Independence Comments: forward flexed trunk slight right lean     TODAY'S TREATMENT  10/10/22 Nustep level 5 x 5 minutes Leg curls 25# 2x10 Leg extension 5# 2x10 10# straight arm pull Lats 20# 2x10 Rows 20# 2x10 Leg press 20# 2x10 PROM LE's Feet on ball core exercises  10/05/22 Leg curls 25# 2x10 Leg ext 5# 2x10 10# straight arms pulls Leg press 20# 2x10 Yellow tband hip abduction x10, hip extension 2x10 Feet on ball K2C, trunk rotation, small bridges and isometric abs LE stretches Seated rows 25# 2x10 Lats 25# 2x10 Thoracic extension over a towel  10/03/22 Seated row 25# 2x15 Lats 25# 2x15 Leg press 40# 2x15 10# straight arm pulls WEighted ball overhead lift Feet on ball K2C, trunk rotation, small bridges, isometric abs PROM stretches of the LE's Towel thoracic extension   09/29/22 Nustep level 5 x 6 minutes Seated rows 20# Lats 20# 2x10 10# straight arm pulls Leg press 20# 2x10 Feet on ball K2C, trunk roation, small bridges and isometric abs Clams green tband Passive stretch to the LE's   09/28/22 Nustep level 5 x 6 minutes Seated row 20# 2x10 Lats 20# 2x10 Leg press 20# 2x10 2 laps walking outside 10# straight arm pulls 2x10 Feet on ball K2C, trunk rotation, small bridge, isometric abs   PATIENT EDUCATION:  Education details: K2C, trunk rotation, HS, piriformis and quad stretches Person educated: Patient Education method: Explanation,  Demonstration, Tactile cues, and Handouts Education comprehension: verbalized understanding   HOME EXERCISE PROGRAM: As above  ASSESSMENT:  CLINICAL  IMPRESSION: Patient reports some neck pain and reports has had issues in the past.  He reports that he feels like he is getting stronger and doing more OBJECTIVE IMPAIRMENTS Abnormal gait, decreased activity tolerance, decreased balance, decreased mobility, difficulty walking, decreased ROM, decreased strength, increased muscle spasms, impaired flexibility, improper body mechanics, postural dysfunction, and pain.   REHAB POTENTIAL: Good  CLINICAL DECISION MAKING: Stable/uncomplicated  EVALUATION COMPLEXITY: Low   GOALS: Goals reviewed with patient? Yes  SHORT TERM GOALS: Target date: 10/10/22  Independent with initial HEP Goal status: met  LONG TERM GOALS: Target date: 12/19/22  Understand posture and body mechanics Goal status: INITIAL  2.  Independent with advanced HEP and return to walking  Goal status: INITIAL  3.  Decrease pain 50% Goal status: INITIAL  4.  Increase lumbar ROM 25% Goal status: INITIAL  PLAN: PT FREQUENCY: 1-2x/week  PT DURATION: 12 weeks  PLANNED INTERVENTIONS: Therapeutic exercises, Therapeutic activity, Neuromuscular re-education, Balance training, Gait training, Patient/Family education, Self Care, Joint mobilization, Vestibular training, Canalith repositioning, Dry Needling, Electrical stimulation, Spinal mobilization, Cryotherapy, Moist heat, Taping, Traction, Ultrasound, and Manual therapy.  PLAN FOR NEXT SESSION: progress as tolerated   Jadence Kinlaw W, PT 10/10/2022, 7:56 AM

## 2022-10-13 ENCOUNTER — Encounter: Payer: Self-pay | Admitting: Physical Therapy

## 2022-10-13 ENCOUNTER — Ambulatory Visit: Payer: Medicare PPO | Admitting: Physical Therapy

## 2022-10-13 DIAGNOSIS — M5459 Other low back pain: Secondary | ICD-10-CM

## 2022-10-13 DIAGNOSIS — R42 Dizziness and giddiness: Secondary | ICD-10-CM

## 2022-10-13 DIAGNOSIS — M62830 Muscle spasm of back: Secondary | ICD-10-CM

## 2022-10-13 NOTE — Therapy (Signed)
OUTPATIENT PHYSICAL THERAPY THORACOLUMBAR TREATMENT   Patient Name: George Freeman MRN: [id number] DOB:09-29-1946, 76 y.o., male Today's Date: 10/13/2022   PT End of Session - 10/13/22 1747     Visit Number 7    Number of Visits 13    Date for PT Re-Evaluation 11/21/22    Authorization Type [employer]             Past Medical History:  Diagnosis Date   Anxiety    Arthritis    BPV (benign positional vertigo)    Depression    GERD (gastroesophageal reflux disease)    Neuromuscular disorder (HCC)    Panic disorder    PONV (postoperative nausea and vomiting)    chills and anxiety after surgery   Past Surgical History:  Procedure Laterality Date   [place]  2009   Stenosis, sciatica    BACK SURGERY  2011   Stenosis, sciatica   BACK SURGERY  December 2014   CERVICAL SPINE SURGERY  Feb. 2014   CERVICAL SPINE SURGERY  07/23/15   fusion   COLONOSCOPY     KNEE SURGERY     2x- cartilage   left foot surgery  1985   Left shoulder surgery  1990   Right shoulder surgery  2012   TOTAL KNEE ARTHROPLASTY  January 2011   TOTAL KNEE ARTHROPLASTY  Feb. 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 02/12/2013   Pain of right heel 12/18/2012    PCP: Coletta Memos  REFERRING PROVIDER: Coletta Memos  REFERRING DIAG: LBP  Rationale for Evaluation and Treatment Rehabilitation  THERAPY DIAG:  Other low back pain  Muscle spasm of back  Dizziness and giddiness  ONSET DATE: August 2023  SUBJECTIVE:                                                                                                                                                                                            SUBJECTIVE STATEMENT: I am really doing pretty good, walking a little more PERTINENT HISTORY:  6 past lumbar surgeries  PAIN:  Are you having pain? Yes: NPRS scale: 3/10 Pain location: back and abdomen Pain description: tight, sore Aggravating factors: first thing in the  AM, standing up straight  after sitting pain could be up to 8-9/10 Relieving factors: better as the day goes   PRECAUTIONS: None  WEIGHT BEARING RESTRICTIONS No  FALLS:  Has patient fallen in last 6 months? No  LIVING ENVIRONMENT: Lives with: lives with their family Lives in: House/apartment Stairs: No Has following equipment at home: None  OCCUPATION: retired  PLOF: Independent  PATIENT GOALS have less pain, walk better move better   OBJECTIVE:   DIAGNOSTIC FINDINGS:  MRI showed stenosis and nerve impingement  PATIENT SURVEYS:  FOTO 52  COGNITION:  Overall cognitive status: Within functional limits for tasks assessed     SENSATION: WFL  MUSCLE LENGTH: Hamstrings: Right 50 deg; Left 50 deg Thomas test: tight, pirformis tight  POSTURE: rounded shoulders, forward head, decreased lumbar lordosis, and flexed trunk   PALPATION: Mild soreness in the right buttock and the right quad, tight in the lumbar area  LUMBAR ROM:   Active  A/PROM  eval  Flexion Decreased 50%  Extension Decreased 100% with some pain  Right lateral flexion Decreased 75%  Left lateral flexion Decreased 75%  Right rotation   Left rotation    (Blank rows = not tested)  LOWER EXTREMITY ROM:      LOWER EXTREMITY MMT:    MMT Right eval Left eval  Hip flexion 4 4+  Hip extension    Hip abduction 4 4  Hip adduction    Hip internal rotation    Hip external rotation    Knee flexion    Knee extension    Ankle dorsiflexion    Ankle plantarflexion    Ankle inversion    Ankle eversion     (Blank rows  = not tested)  FUNCTIONAL TESTS:  5 times sit to stand: 25  GAIT: Distance walked: 100 feet Assistive device utilized: None Level of assistance: Complete Independence Comments: forward flexed trunk slight right lean     TODAY'S TREATMENT  10/13/22 Nustep level 5 x 5 minutes Leg curls 25# 2x15 Leg extension5# 2x15 Lats 20# 2x15 Rows 20# 2x15 10# straight arm pulls 40# leg press 2x10 Feet on ball K2C, trunk rotation, small bridges and isometric abs   10/10/22 Nustep level 5 x 5 minutes Leg curls 25# 2x10 Leg extension 5# 2x10 10# straight arm pull Lats 20# 2x10 Rows 20# 2x10 Leg press 20# 2x10 PROM LE's Feet on ball core exercises  10/05/22 Leg curls 25# 2x10 Leg ext 5# 2x10 10# straight arms pulls Leg press 20# 2x10 Yellow tband hip abduction x10, hip extension 2x10 Feet on ball K2C, trunk rotation, small bridges and isometric abs LE stretches Seated rows 25# 2x10 Lats 25# 2x10 Thoracic extension over a towel  10/03/22 Seated row 25# 2x15 Lats 25# 2x15 Leg press 40# 2x15 10# straight arm pulls WEighted ball overhead lift Feet on ball K2C, trunk rotation, small bridges, isometric abs PROM stretches of the LE's Towel thoracic extension   09/29/22 Nustep level 5 x 6 minutes Seated rows 20# Lats 20# 2x10 10# straight arm pulls Leg press 20# 2x10 Feet on ball K2C, trunk roation, small bridges and isometric abs Clams green tband Passive stretch to the LE's   09/28/22 Nustep level 5 x 6 minutes Seated row 20# 2x10 Lats 20# 2x10 Leg press 20# 2x10 2 laps walking outside 10# straight arm pulls 2x10 Feet on ball K2C, trunk rotation, small bridge, isometric abs   PATIENT EDUCATION:  Education details: K2C, trunk rotation, HS, piriformis and quad stretches Person  educated: Patient Education method: Explanation, Demonstration, Tactile cues, and Handouts Education comprehension: verbalized understanding   HOME EXERCISE PROGRAM: As  above  ASSESSMENT:  CLINICAL IMPRESSION: Reports that he was able to walk 2 miles today without any difficulty, reports that he is feeling stronger and better than he has in a while OBJECTIVE IMPAIRMENTS Abnormal gait, decreased activity tolerance, decreased balance, decreased mobility, difficulty walking, decreased ROM, decreased strength, increased muscle spasms, impaired flexibility, improper body mechanics, postural dysfunction, and pain.   REHAB POTENTIAL: Good  CLINICAL DECISION MAKING: Stable/uncomplicated  EVALUATION COMPLEXITY: Low   GOALS: Goals reviewed with patient? Yes  SHORT TERM GOALS: Target date: 10/10/22  Independent with initial HEP Goal status: met  LONG TERM GOALS: Target date: 12/19/22  Understand posture and body mechanics Goal status:ongoing  2.  Independent with advanced HEP and return to walking  Goal status: INITIAL  3.  Decrease pain 50% Goal status:partially met  4.  Increase lumbar ROM 25% Goal status: met  PLAN: PT FREQUENCY: 1-2x/week  PT DURATION: 12 weeks  PLANNED INTERVENTIONS: Therapeutic exercises, Therapeutic activity, Neuromuscular re-education, Balance training, Gait training, Patient/Family education, Self Care, Joint mobilization, Vestibular training, Canalith repositioning, Dry Needling, Electrical stimulation, Spinal mobilization, Cryotherapy, Moist heat, Taping, Traction, Ultrasound, and Manual therapy.  PLAN FOR NEXT SESSION: progress as tolerated   Sumner Boast, PT 10/13/2022, 5:47 PM

## 2022-10-17 ENCOUNTER — Ambulatory Visit: Payer: Medicare PPO | Admitting: Physical Therapy

## 2022-10-17 ENCOUNTER — Encounter: Payer: Self-pay | Admitting: Physical Therapy

## 2022-10-17 DIAGNOSIS — M5459 Other low back pain: Secondary | ICD-10-CM | POA: Diagnosis not present

## 2022-10-17 DIAGNOSIS — M62830 Muscle spasm of back: Secondary | ICD-10-CM

## 2022-10-17 NOTE — Therapy (Signed)
OUTPATIENT PHYSICAL THERAPY THORACOLUMBAR TREATMENT   Patient Name: George Freeman MRN: [id number] DOB:01-26-1946, 76 y.o., male Today's Date: 10/17/2022   PT End of Session - 10/17/22 1356     Visit Number 8    Date for PT Re-Evaluation 11/21/22    Authorization Type [employer]    PT Start Time 1350    PT Stop Time 1440    PT Time Calculation (min) 50 min    Activity Tolerance Patient tolerated treatment well    Behavior During Therapy WFL for tasks assessed/performed             Past Medical History:  Diagnosis Date   Anxiety    Arthritis    BPV (benign positional vertigo)    Depression    GERD (gastroesophageal reflux disease)    Neuromuscular disorder (HCC)    Panic disorder    PONV (postoperative nausea and vomiting)    chills and anxiety after surgery   Past Surgical History:  Procedure Laterality Date   [place]  2009   Stenosis, sciatica    BACK SURGERY  2011   Stenosis, sciatica   BACK SURGERY  December 2014   CERVICAL SPINE SURGERY  Feb. 2014   CERVICAL SPINE SURGERY  07/23/15   fusion   COLONOSCOPY     KNEE SURGERY     2x- cartilage   left foot surgery  1985   Left shoulder surgery  1990   Right shoulder surgery  2012   TOTAL KNEE ARTHROPLASTY  January 2011   TOTAL KNEE ARTHROPLASTY  Feb. 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 Yes: NPRS scale: 2/10 Pain location: back and abdomen Pain description: tight, sore Aggravating factors: first thing in the AM, standing up straight  after sitting pain could be up to 8-9/10 Relieving factors: better as the day goes   PRECAUTIONS: None  WEIGHT BEARING RESTRICTIONS No  FALLS:  Has patient fallen in last 6 months? No  LIVING ENVIRONMENT: Lives with: lives with their family Lives in: House/apartment Stairs: No Has following equipment at home: None  OCCUPATION: retired  PLOF: Independent  PATIENT GOALS have less pain, walk better move better   OBJECTIVE:   DIAGNOSTIC FINDINGS:  MRI showed stenosis and nerve impingement  PATIENT SURVEYS:  FOTO 52  COGNITION:  Overall cognitive status: Within functional limits for tasks assessed     SENSATION: WFL  MUSCLE LENGTH: Hamstrings: Right 50 deg; Left 50 deg Thomas test: tight, pirformis tight  POSTURE: rounded shoulders, forward head, decreased lumbar lordosis, and flexed trunk   PALPATION: Mild soreness in the right buttock and the right quad, tight in the lumbar area  LUMBAR ROM:   Active  A/PROM  eval  Flexion Decreased 50%  Extension Decreased 100% with some pain  Right lateral flexion Decreased 75%  Left lateral flexion Decreased 75%  Right rotation   Left rotation    (Blank rows = not tested)  LOWER EXTREMITY ROM:      LOWER EXTREMITY MMT:    MMT Right eval Left eval  Hip flexion 4 4+  Hip extension    Hip abduction 4 4  Hip adduction    Hip internal rotation    Hip external rotation     Knee flexion    Knee extension    Ankle dorsiflexion    Ankle plantarflexion    Ankle inversion    Ankle eversion     (Blank rows = not tested)  FUNCTIONAL TESTS:  5 times sit to stand: 25  GAIT: Distance walked: 100 feet Assistive device utilized: None Level of assistance: Complete Independence Comments: forward flexed trunk slight right lean     TODAY'S TREATMENT  10/17/22 Nustep level 5 x 6 minutes 10# straight arm pulls 20# rows 20# lats Education on body mechanics for travel lifting suitcase Leg ext 5# Leg curls 20# Leg press 40# Stretches of the LE's Feet on ball K2C, trunk rotation, small bridges and isometric abs   10/13/22 Nustep level 5 x 5 minutes Leg curls 25# 2x15 Leg extension5# 2x15 Lats 20# 2x15 Rows 20# 2x15 10# straight arm pulls 40# leg press 2x10 Feet on ball K2C, trunk rotation, small bridges and isometric abs   10/10/22 Nustep level 5 x 5 minutes Leg curls 25# 2x10 Leg extension 5# 2x10 10# straight arm pull Lats 20# 2x10 Rows 20# 2x10 Leg press 20# 2x10 PROM LE's Feet on ball core exercises  10/05/22 Leg curls 25# 2x10 Leg ext 5# 2x10 10# straight arms pulls Leg press 20# 2x10 Yellow tband hip abduction x10, hip extension 2x10 Feet on ball K2C, trunk rotation, small bridges and isometric abs LE stretches Seated rows 25# 2x10 Lats 25# 2x10 Thoracic extension over a towel  10/03/22 Seated row 25# 2x15 Lats 25# 2x15 Leg press 40# 2x15 10# straight arm pulls WEighted ball overhead lift Feet on ball K2C, trunk rotation, small bridges, isometric abs PROM stretches of the LE's Towel thoracic extension   09/29/22 Nustep level 5 x 6 minutes Seated rows 20# Lats 20# 2x10 10# straight arm pulls Leg press  20# 2x10 Feet on ball K2C, trunk roation, small bridges and isometric abs Clams green tband Passive stretch to the LE's   09/28/22 Nustep level 5 x 6 minutes Seated row 20# 2x10 Lats 20# 2x10 Leg press 20#  2x10 2 laps walking outside 10# straight arm pulls 2x10 Feet on ball K2C, trunk rotation, small bridge, isometric abs   PATIENT EDUCATION:  Education details: K2C, trunk rotation, HS, piriformis and quad stretches Person educated: Patient Education method: Explanation, Demonstration, Tactile cues, and Handouts Education comprehension: verbalized understanding   HOME EXERCISE PROGRAM: As above  ASSESSMENT:  CLINICAL IMPRESSION: Patient doing very well going on vacation and pleased that he feels so good, much less pain, reports mostly stiffness OBJECTIVE IMPAIRMENTS Abnormal gait, decreased activity tolerance, decreased balance, decreased mobility, difficulty walking, decreased ROM, decreased strength, increased muscle spasms, impaired flexibility, improper body mechanics, postural dysfunction, and pain.   REHAB POTENTIAL: Good  CLINICAL DECISION MAKING: Stable/uncomplicated  EVALUATION COMPLEXITY: Low   GOALS: Goals reviewed with patient? Yes  SHORT TERM GOALS: Target date: 10/10/22  Independent with initial HEP Goal status: met  LONG TERM GOALS: Target date: 12/19/22  Understand posture and body mechanics Goal status:met  2.  Independent with advanced HEP and return to walking  Goal status:met  3.  Decrease pain 50% Goal status:met  4.  Increase lumbar ROM 25% Goal status: met  PLAN: PT FREQUENCY: 1-2x/week  PT DURATION: 12 weeks  PLANNED INTERVENTIONS: Therapeutic exercises, Therapeutic activity, Neuromuscular re-education, Balance training, Gait training, Patient/Family education, Self Care, Joint mobilization, Vestibular training, Canalith repositioning, Dry Needling, Electrical stimulation, Spinal mobilization, Cryotherapy, Moist heat, Taping, Traction, Ultrasound, and Manual therapy.  PLAN FOR NEXT SESSION: d/c goals met   Sumner Boast, PT 10/17/2022, 1:56 PM

## 2022-12-28 ENCOUNTER — Encounter: Payer: Self-pay | Admitting: Physical Therapy

## 2022-12-28 ENCOUNTER — Ambulatory Visit: Payer: Medicare PPO | Attending: Health Care" | Admitting: Physical Therapy

## 2022-12-28 DIAGNOSIS — M542 Cervicalgia: Secondary | ICD-10-CM | POA: Diagnosis not present

## 2022-12-28 DIAGNOSIS — R42 Dizziness and giddiness: Secondary | ICD-10-CM | POA: Diagnosis present

## 2022-12-28 DIAGNOSIS — M62830 Muscle spasm of back: Secondary | ICD-10-CM | POA: Insufficient documentation

## 2022-12-28 DIAGNOSIS — M5459 Other low back pain: Secondary | ICD-10-CM | POA: Diagnosis present

## 2022-12-28 DIAGNOSIS — R252 Cramp and spasm: Secondary | ICD-10-CM | POA: Diagnosis present

## 2022-12-28 NOTE — Therapy (Signed)
OUTPATIENT PHYSICAL THERAPY CERVICAL EVALUATION   Patient Name: George Freeman MRN: [id number] DOB:1946/09/17, 77 y.o., male Today's Date: 12/28/2022  END OF SESSION:  PT End of Session - 12/28/22 0754     Visit Number 1    Number of Visits 13    Date for PT Re-Evaluation 02/26/23    Authorization Type [employer]    PT Start Time 0754    PT Stop Time 0841    PT Time Calculation (min) 47 min    Activity Tolerance Patient tolerated treatment well    Behavior During Therapy WFL for tasks assessed/performed             Past Medical History:  Diagnosis Date   Anxiety    Arthritis    BPV (benign positional vertigo)    Depression    GERD (gastroesophageal reflux disease)    Neuromuscular disorder (HCC)    Panic disorder    PONV (postoperative nausea and vomiting)    chills and anxiety after surgery   Past Surgical History:  Procedure Laterality Date   [place]  2009   Stenosis, sciatica    BACK SURGERY  2011   Stenosis, sciatica   BACK SURGERY  December 2014   CERVICAL SPINE SURGERY  Feb. 2014   CERVICAL SPINE SURGERY  07/23/15   fusion   COLONOSCOPY     KNEE SURGERY     2x- cartilage   left foot surgery  1985   Left shoulder surgery  1990   Right shoulder surgery  2012   TOTAL KNEE ARTHROPLASTY  January 2011   TOTAL KNEE ARTHROPLASTY  Feb. 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  MD reported spondylosis and did not think surgery was warranted.  Has some tingling in the right shoulder area and in the back of the head.  PERTINENT HISTORY:  Has dizziness and vertigo in the past, 1 level fusion 2019  PAIN:  Are you having pain? Yes: NPRS scale: 2/10 Pain location: neck right side and right shoulder  Pain description: ache, numbness, tingling Aggravating factors: turn head to the right and look up pain up to 8/10 Relieving factors: Celebrex 2x/day not move pain can be 0/10   PRECAUTIONS: None  WEIGHT BEARING RESTRICTIONS: No  FALLS:  Has patient fallen in last 6 months? No  LIVING ENVIRONMENT: Lives with: lives with their family and lives with their spouse Lives in: House/apartment Stairs: No Has following equipment at home: None  OCCUPATION: retired  PLOF: Independent and minimal pain,   PATIENT GOALS: have less pain  NEXT MD VISIT:   OBJECTIVE:   DIAGNOSTIC FINDINGS:  Cervical spondylosis  PATIENT SURVEYS:  FOTO 38  COGNITION: Overall cognitive status: Within functional limits for tasks assessed  SENSATION: Right back of head and neck, right upper trap  POSTURE: rounded shoulders and forward head  PALPATION: Mild tenderness in the upper trap and the neck, tight in the right neck area   CERVICAL ROM:   Active ROM A/PROM (deg) eval  Flexion Decreased 25%  Extension Decreased 75%  Right lateral flexion Decreased 75%  Left lateral flexion Decreased 75%  Right rotation Decreased 50%  Left rotation  Decreased 50%   (Blank rows = not tested)  UPPER EXTREMITY ROM:  Mild limitations in the shoulders just tight, right shoulder TSA and left shoulder RC repair UPPER EXTREMITY MMT:  MMT Right eval Left eval  Shoulder flexion 4- 4  Shoulder extension    Shoulder abduction    Shoulder adduction    Shoulder extension    Shoulder internal rotation 3+ 4-  Shoulder external rotation 3+ 4-  Middle trapezius    Lower trapezius    Elbow flexion    Elbow extension    Wrist flexion    Wrist extension    Wrist ulnar deviation    Wrist radial deviation    Wrist pronation    Wrist supination    Grip strength     (Blank rows = not tested)  CERVICAL SPECIAL TESTS:  Spurling's test: Positive  TODAY'S TREATMENT:                                                                                                                              DATE:   PATIENT EDUCATION:  Education details: POC and HEP Person educated: Patient Education method: Programmer, multimedia, Facilities manager, Verbal cues, and Handouts Education comprehension: verbalized understanding  HOME EXERCISE PROGRAM: Access Code: [email or web address] URL: [email or web address] Date: 12/28/2022 Prepared by: Stacie Glaze  Exercises - Standing Cervical Retraction  - 1 x daily -  7 x weekly - 2 sets - 10 reps - 3 hold - Standing Cervical Retraction with Sidebending  - 1 x daily - 7 x weekly - 2 sets - 10 reps - 3 hold - Seated Scapular Retraction  - 1 x daily - 7 x weekly - 2 sets - 10 reps - 3 hold - Seated Shoulder Shrugs  - 1 x daily - 7 x weekly - 2 sets - 10 reps - 3 hold  ASSESSMENT:  CLINICAL IMPRESSION: Patient is a 77 y.o. male who was seen today for physical therapy evaluation and treatment for neck pain with some radiculopathy.  Had a one level fusion in 2019, MD diagnosis is spondylosis.  Worse with rotation and extension.  Tightness in the neck and traps   OBJECTIVE IMPAIRMENTS: decreased ROM, decreased strength,  dizziness, increased muscle spasms, impaired flexibility, postural dysfunction, and pain.   REHAB POTENTIAL: Good  CLINICAL DECISION MAKING: Stable/uncomplicated  EVALUATION COMPLEXITY: Low   GOALS: Goals reviewed with patient? Yes  SHORT TERM GOALS: Target date: 01/11/23  Independent with initial HEP Goal status: INITIAL  LONG TERM GOALS: Target date: 03/29/23  Understand posture and body mechanics Goal status: INITIAL  2.  Have a good ergonomic set up for computer and TV use Goal status: INITIAL  3.  Increase cervical ROM 25% Goal status: INITIAL  4.  Decrease pain 25% Goal status: INITIAL  5.  Increase shoulder ER to 4/5 Goal status: INITIAL   PLAN:  PT FREQUENCY: 1-2x/week  PT DURATION: 12 weeks  PLANNED INTERVENTIONS: Therapeutic exercises, Therapeutic activity, Neuromuscular re-education, Balance training, Gait training, Patient/Family education, Self Care, Joint mobilization, Vestibular training, Canalith repositioning, Visual/preceptual remediation/compensation, Dry Needling, Electrical stimulation, Spinal mobilization, Moist heat, Traction, Ultrasound, and Manual therapy  PLAN FOR NEXT SESSION: he will be travelling a lot and we will need to spread out his visits over a longer period of time, he is leaving next week so will be conservative until he returns   [employer], PT 12/28/2022, 8:26 AM

## 2023-01-03 ENCOUNTER — Encounter: Payer: Self-pay | Admitting: Physical Therapy

## 2023-01-03 ENCOUNTER — Ambulatory Visit: Payer: Medicare PPO | Admitting: Physical Therapy

## 2023-01-03 DIAGNOSIS — R42 Dizziness and giddiness: Secondary | ICD-10-CM

## 2023-01-03 DIAGNOSIS — M62830 Muscle spasm of back: Secondary | ICD-10-CM

## 2023-01-03 DIAGNOSIS — M542 Cervicalgia: Secondary | ICD-10-CM | POA: Diagnosis not present

## 2023-01-03 DIAGNOSIS — M5459 Other low back pain: Secondary | ICD-10-CM

## 2023-01-03 DIAGNOSIS — R252 Cramp and spasm: Secondary | ICD-10-CM

## 2023-01-03 NOTE — Therapy (Signed)
OUTPATIENT PHYSICAL THERAPY CERVICAL TREATMENT   Patient Name: George Freeman MRN: [id number] DOB:1946-11-14, 77 y.o., male Today's Date: 01/03/2023  END OF SESSION:  PT End of Session - 01/03/23 1445     Visit Number 2    Number of Visits 13    Date for PT Re-Evaluation 03/22/23    Authorization Type [employer]    PT Start Time 1443    PT Stop Time 1531    PT Time Calculation (min) 48 min    Activity Tolerance Patient tolerated treatment well    Behavior During Therapy WFL for tasks assessed/performed             Past Medical History:  Diagnosis Date   Anxiety    Arthritis    BPV (benign positional vertigo)    Depression    GERD (gastroesophageal reflux disease)    Neuromuscular disorder (HCC)    Panic disorder    PONV (postoperative nausea and vomiting)    chills and anxiety after surgery   Past Surgical History:  Procedure Laterality Date   [place]  2009   Stenosis, sciatica    BACK SURGERY  2011   Stenosis, sciatica   BACK SURGERY  December 2014   CERVICAL SPINE SURGERY  Feb. 2014   CERVICAL SPINE SURGERY  07/23/15   fusion   COLONOSCOPY     KNEE SURGERY     2x- cartilage   left foot surgery  1985   Left shoulder surgery  1990   Right shoulder surgery  2012   TOTAL KNEE ARTHROPLASTY  January 2011   TOTAL KNEE ARTHROPLASTY  Feb. 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 Yes: NPRS scale: 2/10 Pain location: neck right side and right shoulder  Pain description: ache, numbness, tingling Aggravating factors: turn head to the right and look up pain up to 8/10 Relieving factors: Celebrex 2x/day not move pain can be 0/10   PRECAUTIONS: None  WEIGHT BEARING RESTRICTIONS: No  FALLS:  Has patient fallen in last 6 months? No  LIVING ENVIRONMENT: Lives with: lives with their family and lives with their spouse Lives in: House/apartment Stairs: No Has following equipment at home: None  OCCUPATION: retired  PLOF: Independent and minimal pain,   PATIENT GOALS: have less pain  NEXT MD VISIT:   OBJECTIVE:   DIAGNOSTIC FINDINGS:  Cervical spondylosis  PATIENT SURVEYS:  FOTO 38  COGNITION: Overall cognitive status: Within functional limits for tasks assessed  SENSATION: Right back of head and neck, right upper trap  POSTURE: rounded shoulders and forward head  PALPATION: Mild tenderness in the upper trap and the neck, tight in the right neck area   CERVICAL ROM:   Active ROM A/PROM (deg) eval  Flexion Decreased 25%  Extension Decreased 75%  Right lateral flexion Decreased 75%  Left lateral flexion Decreased 75%  Right rotation Decreased 50%  Left rotation Decreased 50%   (Blank rows = not tested)  UPPER EXTREMITY ROM:  Mild  limitations in the shoulders just tight, right shoulder TSA and left shoulder RC repair UPPER EXTREMITY MMT:  MMT Right eval Left eval  Shoulder flexion 4- 4  Shoulder extension    Shoulder abduction    Shoulder adduction    Shoulder extension    Shoulder internal rotation 3+ 4-  Shoulder external rotation 3+ 4-  Middle trapezius    Lower trapezius    Elbow flexion    Elbow extension    Wrist flexion    Wrist extension    Wrist ulnar deviation    Wrist radial deviation    Wrist pronation    Wrist supination    Grip strength     (Blank rows = not tested)  CERVICAL SPECIAL TESTS:  Spurling's test: Positive  TODAY'S TREATMENT:                                                                                                                              DATE:  01/03/23 Nustep Level 5 x 6 minutes Seated row 20# 2x10 Lats 20# 2x10 Leg press 20# 2x10 Red tband ER back to wall Weighted ball overhead lift 5# shrugs with upper trap and levator stretches 1# W backs with over pressure 25# HS curls 5# leg extension STM to the right neck and upper trap   PATIENT EDUCATION:  Education details: POC and HEP Person educated: Patient Education method: Programmer, multimedia, Facilities manager, Verbal  cues, and Handouts Education comprehension: verbalized understanding  HOME EXERCISE PROGRAM: Access Code: [email or web address] URL: [email or web address] Date: 12/28/2022 Prepared by: Stacie Glaze  Exercises - Standing Cervical Retraction  - 1 x daily - 7 x weekly - 2 sets - 10 reps - 3 hold - Standing Cervical Retraction with Sidebending  - 1 x daily - 7 x weekly - 2 sets - 10 reps - 3 hold - Seated Scapular Retraction  - 1 x daily - 7 x weekly - 2 sets - 10 reps - 3 hold - Seated Shoulder Shrugs  - 1 x daily - 7 x weekly - 2 sets - 10 reps - 3 hold  ASSESSMENT:  CLINICAL IMPRESSION: Patient reports that he feels the HEP is helping, less pain but still stiff at times, did well  exercises, struggles with the stretching of the upper trap and levator due to tightness, also difficulty with the W backs due to tightness OBJECTIVE IMPAIRMENTS: decreased ROM, decreased strength, dizziness, increased muscle spasms, impaired flexibility, postural dysfunction, and pain.   REHAB POTENTIAL: Good  CLINICAL DECISION MAKING: Stable/uncomplicated  EVALUATION COMPLEXITY: Low   GOALS: Goals reviewed with patient? Yes  SHORT TERM GOALS: Target date: 01/11/23  Independent with initial HEP Goal status: met  LONG TERM GOALS: Target date: 03/29/23  Understand posture and body mechanics Goal status: INITIAL  2.  Have a good ergonomic set up for computer and TV use Goal status: INITIAL  3.  Increase cervical ROM 25% Goal status: INITIAL  4.  Decrease pain 25% Goal status: INITIAL  5.  Increase shoulder ER to 4/5 Goal status: INITIAL   PLAN:  PT FREQUENCY: 1-2x/week  PT DURATION: 12 weeks  PLANNED INTERVENTIONS: Therapeutic exercises, Therapeutic activity, Neuromuscular re-education, Balance training, Gait training, Patient/Family education, Self Care, Joint mobilization, Vestibular training, Canalith repositioning, Visual/preceptual remediation/compensation, Dry Needling, Electrical stimulation, Spinal mobilization, Moist heat, Traction, Ultrasound, and Manual therapy  PLAN FOR NEXT SESSION: he will be travelling a lot and we will need to spread out his visits over a longer period of time, he is leaving next week so will be conservative until he returns   [employer], PT 01/03/2023, 2:46 PM

## 2023-01-05 ENCOUNTER — Encounter: Payer: Self-pay | Admitting: Physical Therapy

## 2023-01-05 ENCOUNTER — Ambulatory Visit: Payer: Medicare PPO | Admitting: Physical Therapy

## 2023-01-05 DIAGNOSIS — M542 Cervicalgia: Secondary | ICD-10-CM

## 2023-01-05 DIAGNOSIS — R252 Cramp and spasm: Secondary | ICD-10-CM

## 2023-01-05 DIAGNOSIS — M5459 Other low back pain: Secondary | ICD-10-CM

## 2023-01-05 DIAGNOSIS — M62830 Muscle spasm of back: Secondary | ICD-10-CM

## 2023-01-05 NOTE — Therapy (Signed)
OUTPATIENT PHYSICAL THERAPY CERVICAL TREATMENT   Patient Name: George Freeman MRN: [id number] DOB:Nov 12, 1946, 77 y.o., male Today's Date: 01/05/2023  END OF SESSION:  PT End of Session - 01/05/23 1740     Visit Number 3    Number of Visits 13    Date for PT Re-Evaluation 03/22/23    Authorization Type [employer]    PT Start Time 1750    PT Stop Time 1830    PT Time Calculation (min) 40 min    Activity Tolerance Patient tolerated treatment well    Behavior During Therapy WFL for tasks assessed/performed             Past Medical History:  Diagnosis Date   Anxiety    Arthritis    BPV (benign positional vertigo)    Depression    GERD (gastroesophageal reflux disease)    Neuromuscular disorder (HCC)    Panic disorder    PONV (postoperative nausea and vomiting)    chills and anxiety after surgery   Past Surgical History:  Procedure Laterality Date   ANAL FISSURE REPAIR  1993   APPENDECTOMY  1969   BACK SURGERY  2009   Stenosis, sciatica    BACK SURGERY  2011   Stenosis, sciatica   BACK SURGERY  December 2014   CERVICAL SPINE SURGERY  Feb. 2014   CERVICAL SPINE SURGERY  07/23/15   fusion   COLONOSCOPY     KNEE SURGERY     2x- cartilage   left foot surgery  1985   Left shoulder surgery  1990   Right shoulder surgery  2012   TOTAL KNEE ARTHROPLASTY  January 2011   TOTAL KNEE ARTHROPLASTY  Feb. 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 Yes: NPRS scale: 1/10 Pain location: neck right side and right shoulder  Pain description: ache, numbness, tingling Aggravating factors: turn head to the right and look up pain up to 8/10 Relieving factors: Celebrex 2x/day not move pain can be 0/10   PRECAUTIONS: None  WEIGHT BEARING RESTRICTIONS: No  FALLS:  Has patient fallen in last 6 months? No  LIVING ENVIRONMENT: Lives with: lives with their family and lives with their spouse Lives in: House/apartment Stairs: No Has following equipment at home: None  OCCUPATION: retired  PLOF: Independent and minimal pain,   PATIENT GOALS: have less pain  NEXT MD VISIT:   OBJECTIVE:   DIAGNOSTIC FINDINGS:  Cervical spondylosis  PATIENT SURVEYS:  FOTO 38  COGNITION: Overall cognitive status: Within functional limits for tasks assessed  SENSATION: Right back of head and neck, right upper trap  POSTURE: rounded shoulders and forward head  PALPATION: Mild tenderness in the upper trap and the neck, tight in the right neck area   CERVICAL ROM:   Active ROM A/PROM (deg) eval  Flexion Decreased 25%  Extension Decreased 75%  Right lateral flexion Decreased 75%  Left lateral flexion Decreased 75%  Right rotation Decreased 50%  Left rotation Decreased 50%   (Blank rows = not tested)  UPPER EXTREMITY ROM:  Mild limitations in the shoulders just  tight, right shoulder TSA and left shoulder RC repair UPPER EXTREMITY MMT:  MMT Right eval Left eval  Shoulder flexion 4- 4  Shoulder extension    Shoulder abduction    Shoulder adduction    Shoulder extension    Shoulder internal rotation 3+ 4-  Shoulder external rotation 3+ 4-  Middle trapezius    Lower trapezius    Elbow flexion    Elbow extension    Wrist flexion    Wrist extension    Wrist ulnar deviation    Wrist radial deviation    Wrist pronation    Wrist supination    Grip strength     (Blank rows = not tested)  CERVICAL SPECIAL TESTS:  Spurling's test: Positive  TODAY'S TREATMENT:                                                                                                                              DATE:  01/05/23 Nustep level 5 x 5 minutes Leg extension 5# Leg curls 20# Leg press 40# Seated row 20# Lats 20# 10# straight arms x10 and then 5# x 10 Red tband ER and small abduction at side STM to the cervical spine  01/03/23 Nustep Level 5 x 6 minutes Seated row 20# 2x10 Lats 20# 2x10 Leg press 20# 2x10 Red tband ER back to wall Weighted ball overhead lift 5#  shrugs with upper trap and levator stretches 1# W backs with over pressure 25# HS curls 5# leg extension STM to the right neck and upper trap   PATIENT EDUCATION:  Education details: POC and HEP Person educated: Patient Education method: Consulting civil engineer, Media planner, Verbal cues, and Handouts Education comprehension: verbalized understanding  HOME EXERCISE PROGRAM: Access Code: [email or web address] URL: [email or web address] Date: 12/28/2022 Prepared by: Lum Babe  Exercises - Standing Cervical Retraction  - 1 x daily - 7 x weekly - 2 sets - 10 reps - 3 hold - Standing Cervical Retraction with Sidebending  - 1 x daily - 7 x weekly - 2 sets - 10 reps - 3 hold - Seated Scapular Retraction  - 1 x daily - 7 x weekly - 2 sets - 10 reps - 3 hold - Seated Shoulder Shrugs  - 1 x  daily - 7 x weekly - 2 sets - 10 reps - 3 hold  ASSESSMENT:  CLINICAL IMPRESSION: Patient reports that he feels tmuch better overall, we went over the gym equipment the weight and the reps and I wrote this down for him, he is travelling over the next month but where he is staying has a gym, we went over safety.  Still some tightness in the neck OBJECTIVE IMPAIRMENTS: decreased ROM, decreased strength, dizziness, increased muscle spasms, impaired flexibility, postural dysfunction, and pain.   REHAB POTENTIAL: Good  CLINICAL DECISION MAKING: Stable/uncomplicated  EVALUATION COMPLEXITY: Low   GOALS: Goals reviewed with patient? Yes  SHORT TERM GOALS: Target date: 01/11/23  Independent with initial HEP Goal status: met  LONG TERM GOALS: Target date: 03/29/23  Understand posture and body mechanics Goal status: INITIAL  2.  Have a good ergonomic set up for computer and TV use Goal status: INITIAL  3.  Increase cervical ROM 25% Goal status: INITIAL  4.  Decrease pain 25% Goal status: INITIAL  5.  Increase shoulder ER to 4/5 Goal status: INITIAL   PLAN:  PT FREQUENCY: 1-2x/week  PT DURATION: 12 weeks  PLANNED INTERVENTIONS: Therapeutic exercises, Therapeutic activity, Neuromuscular re-education, Balance training, Gait training, Patient/Family education, Self Care, Joint mobilization, Vestibular training, Canalith repositioning, Visual/preceptual remediation/compensation, Dry Needling, Electrical stimulation, Spinal mobilization, Moist heat, Traction, Ultrasound, and Manual therapy  PLAN FOR NEXT SESSION: he will be travelling a lot and we will need to spread out his visits over a longer period of time, he is leaving next week so will be conservative until he returns   [employer], PT 01/05/2023, 6:24 PM

## 2023-02-09 ENCOUNTER — Ambulatory Visit: Payer: Medicare PPO | Admitting: Physical Therapy

## 2023-02-13 ENCOUNTER — Encounter: Payer: Self-pay | Admitting: Physical Therapy

## 2023-02-13 ENCOUNTER — Ambulatory Visit: Payer: Medicare PPO | Attending: Health Care" | Admitting: Physical Therapy

## 2023-02-13 DIAGNOSIS — M542 Cervicalgia: Secondary | ICD-10-CM | POA: Insufficient documentation

## 2023-02-13 DIAGNOSIS — M5459 Other low back pain: Secondary | ICD-10-CM | POA: Diagnosis present

## 2023-02-13 DIAGNOSIS — R252 Cramp and spasm: Secondary | ICD-10-CM | POA: Insufficient documentation

## 2023-02-13 DIAGNOSIS — M62830 Muscle spasm of back: Secondary | ICD-10-CM | POA: Diagnosis present

## 2023-02-13 DIAGNOSIS — R42 Dizziness and giddiness: Secondary | ICD-10-CM | POA: Insufficient documentation

## 2023-02-13 NOTE — Therapy (Signed)
OUTPATIENT PHYSICAL THERAPY CERVICAL TREATMENT   Patient Name: George Freeman MRN: [id number] DOB:1946-10-31, 77 y.o., male Today's Date: 02/13/2023  END OF SESSION:  PT End of Session - 02/13/23 1100     Visit Number 4    Number of Visits 13    Date for PT Re-Evaluation 03/22/23    Authorization Type [employer]    PT Start Time 1100    PT Stop Time 1150    PT Time Calculation (min) 50 min    Activity Tolerance Patient tolerated treatment well    Behavior During Therapy WFL for tasks assessed/performed             Past Medical History:  Diagnosis Date   Anxiety    Arthritis    BPV (benign positional vertigo)    Depression    GERD (gastroesophageal reflux disease)    Neuromuscular disorder (HCC)    Panic disorder    PONV (postoperative nausea and vomiting)    chills and anxiety after surgery   Past Surgical History:  Procedure Laterality Date   [place] SURGERY  2009   Stenosis, sciatica    BACK SURGERY  2011   Stenosis, sciatica   BACK SURGERY  December 2014   CERVICAL SPINE SURGERY  Feb. 2014   CERVICAL SPINE SURGERY  07/23/15   fusion   COLONOSCOPY     KNEE SURGERY     2x- cartilage   left foot surgery  1985   Left shoulder surgery  1990   Right shoulder surgery  2012   TOTAL KNEE ARTHROPLASTY  January 2011   TOTAL KNEE ARTHROPLASTY  Feb. 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 02/12/2013   Pain of right heel 12/18/2012    PCP: Coletta Memos, MD  REFERRING PROVIDER: Monica Becton MD  REFERRING DIAG: cervical spondylosis  THERAPY DIAG:   Cervicalgia  Cramp and spasm  Other low back pain  Muscle spasm of back  Dizziness and giddiness  Rationale for Evaluation and Treatment: Rehabilitation  ONSET DATE: 12/14/22  SUBJECTIVE:  SUBJECTIVE STATEMENT: Patient returns to PT after a month, he was on vacation, reports that he stopped the celebrex, reports issues with watching and using ipad is the biggest issue, but he is becoming more aware PERTINENT HISTORY:  Has dizziness and vertigo in the past, 1 level fusion 2019  PAIN:  Are you having pain? Yes: NPRS scale: 1/10 Pain location: neck right side and right shoulder  Pain description: ache, numbness, tingling Aggravating factors: turn head to the right and look up pain up to 8/10 Relieving factors: Celebrex 2x/day not move pain can be 0/10   PRECAUTIONS: None  WEIGHT BEARING RESTRICTIONS: No  FALLS:  Has patient fallen in last 6 months? No  LIVING ENVIRONMENT: Lives with: lives with their family and lives with their spouse Lives in: House/apartment Stairs: No Has following equipment at home: None  OCCUPATION: retired  PLOF: Independent and minimal pain,   PATIENT GOALS: have less pain  NEXT MD VISIT:   OBJECTIVE:   DIAGNOSTIC FINDINGS:  Cervical spondylosis  PATIENT SURVEYS:  FOTO 38  COGNITION: Overall cognitive status: Within functional limits for tasks assessed  SENSATION: Right back of head and neck, right upper trap  POSTURE: rounded shoulders and forward head  PALPATION: Mild tenderness in the upper trap and the neck, tight in the right neck area   CERVICAL ROM:   Active ROM A/PROM (deg) eval  Flexion Decreased 25%  Extension Decreased 75%  Right lateral flexion Decreased 75%  Left lateral flexion Decreased 75%  Right rotation  Decreased 50%  Left rotation Decreased 50%   (Blank rows = not tested)  UPPER EXTREMITY ROM:  Mild limitations in the shoulders just tight, right shoulder TSA and left shoulder RC repair UPPER EXTREMITY MMT:  MMT Right eval Left eval  Shoulder flexion 4- 4  Shoulder extension    Shoulder abduction    Shoulder adduction    Shoulder extension    Shoulder internal rotation 3+ 4-  Shoulder external rotation 3+ 4-  Middle trapezius    Lower trapezius    Elbow flexion    Elbow extension    Wrist flexion    Wrist extension    Wrist ulnar deviation    Wrist radial deviation    Wrist pronation    Wrist supination    Grip strength     (Blank rows = not tested)  CERVICAL SPECIAL TESTS:  Spurling's test: Positive  TODAY'S TREATMENT:                                                                                                                              DATE:  02/13/23 Nustep level 6 x 6 minutes 10# straight arm pulls 10# AR press 2x10 15# seated row 2x15 20# lats 2x15 Black tband extension lumbar Leg press 40# 2x10 STM to the cervical area and the upper trap   01/05/23 Nustep level 5 x 5 minutes Leg extension 5# Leg curls 20# Leg press 40#  Seated row 20# Lats 20# 10# straight arms x10 and then 5# x 10 Red tband ER and small abduction at side STM to the cervical spine  01/03/23 Nustep Level 5 x 6 minutes Seated row 20# 2x10 Lats 20# 2x10 Leg press 20# 2x10 Red tband ER back to wall Weighted ball overhead lift 5# shrugs with upper trap and levator stretches 1# W backs with over pressure 25# HS curls 5# leg extension STM to the right neck and upper trap   PATIENT EDUCATION:  Education details: POC and HEP Person educated: Patient Education method: Consulting civil engineer, Media planner, Verbal cues, and Handouts Education comprehension: verbalized understanding  HOME EXERCISE PROGRAM: Access Code: [email or web address] URL: [email or web address] Date:  12/28/2022 Prepared by: Lum Babe  Exercises - Standing Cervical Retraction  - 1 x daily - 7 x weekly - 2 sets - 10 reps - 3 hold - Standing Cervical Retraction with Sidebending  - 1 x daily - 7 x weekly - 2 sets - 10 reps - 3 hold - Seated Scapular Retraction  - 1 x daily - 7 x weekly - 2 sets - 10 reps - 3 hold - Seated Shoulder Shrugs  - 1 x daily - 7 x weekly - 2 sets - 10 reps - 3 hold  ASSESSMENT:  CLINICAL IMPRESSION: Patient doing well, was on vacation the past month, reports that he has noticed that if he is in a bad position he starts to have the pain and discomfort, so he is realizing how important his posture is, he is working to correct but cannot hold long, I feel that he has some weakness in the postural mms and we will work on this as well as the posture nad body mechanics OBJECTIVE IMPAIRMENTS: decreased ROM, decreased strength, dizziness, increased muscle spasms, impaired flexibility, postural dysfunction, and pain.   REHAB POTENTIAL: Good  CLINICAL DECISION MAKING: Stable/uncomplicated  EVALUATION COMPLEXITY: Low   GOALS: Goals reviewed with patient? Yes  SHORT TERM GOALS: Target date: 01/11/23  Independent with initial HEP Goal status: met  LONG TERM GOALS: Target date: 03/29/23  Understand posture and body mechanics Goal status: progressing 02/13/23  2.  Have a good ergonomic set up for computer and TV use Goal status: progressing 02/13/23  3.  Increase cervical ROM 25% Goal status: progressing   4.  Decrease pain 25% Goal status  ongoing  5.  Increase shoulder ER to 4/5 Goal status: progressing   PLAN:  PT FREQUENCY: 1-2x/week  PT DURATION: 12 weeks  PLANNED INTERVENTIONS: Therapeutic exercises, Therapeutic activity, Neuromuscular re-education, Balance training, Gait training, Patient/Family education, Self Care, Joint mobilization, Vestibular training, Canalith repositioning, Visual/preceptual remediation/compensation, Dry Needling,  Electrical stimulation, Spinal mobilization, Moist heat, Traction, Ultrasound, and Manual therapy  PLAN FOR NEXT SESSION: he will be travelling a lot and we will need to spread out his visits over a longer period of time, he is leaving next week so will be conservative until he returns   [employer], PT 02/13/2023, 11:03 AM

## 2023-02-16 ENCOUNTER — Ambulatory Visit: Payer: Medicare PPO | Admitting: Physical Therapy

## 2023-02-16 ENCOUNTER — Encounter: Payer: Self-pay | Admitting: Physical Therapy

## 2023-02-16 DIAGNOSIS — R252 Cramp and spasm: Secondary | ICD-10-CM

## 2023-02-16 DIAGNOSIS — M5459 Other low back pain: Secondary | ICD-10-CM

## 2023-02-16 DIAGNOSIS — M542 Cervicalgia: Secondary | ICD-10-CM | POA: Diagnosis not present

## 2023-02-16 DIAGNOSIS — M62830 Muscle spasm of back: Secondary | ICD-10-CM

## 2023-02-16 NOTE — Therapy (Signed)
OUTPATIENT PHYSICAL THERAPY CERVICAL TREATMENT   Patient Name: George Freeman MRN: [id number] DOB:30-Apr-1946, 77 y.o., male Today's Date: 02/16/2023  END OF SESSION:  PT End of Session - 02/16/23 1116     Visit Number 5    Number of Visits 13    Date for PT Re-Evaluation 03/22/23    Authorization Type [employer]    PT Start Time 1058    PT Stop Time 1145    PT Time Calculation (min) 47 min    Activity Tolerance Patient tolerated treatment well    Behavior During Therapy WFL for tasks assessed/performed             Past Medical History:  Diagnosis Date   Anxiety    Arthritis    BPV (benign positional vertigo)    Depression    GERD (gastroesophageal reflux disease)    Neuromuscular disorder (HCC)    Panic disorder    PONV (postoperative nausea and vomiting)    chills and anxiety after surgery   Past Surgical History:  Procedure Laterality Date   [place]  2009   Stenosis, sciatica    BACK SURGERY  2011   Stenosis, sciatica   BACK SURGERY  December 2014   CERVICAL SPINE SURGERY  Feb. 2014   CERVICAL SPINE SURGERY  07/23/15   fusion   COLONOSCOPY     KNEE SURGERY     2x- cartilage   left foot surgery  1985   Left shoulder surgery  1990   Right shoulder surgery  2012   TOTAL KNEE ARTHROPLASTY  January 2011   TOTAL KNEE ARTHROPLASTY  Feb. 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 Yes: NPRS scale: 1/10 Pain location: neck right side and right shoulder  Pain description: ache, numbness, tingling Aggravating factors: turn head to the right and look up pain up to 8/10 Relieving factors: Celebrex 2x/day not move pain can be 0/10   PRECAUTIONS: None  WEIGHT BEARING RESTRICTIONS: No  FALLS:  Has patient fallen in last 6 months? No  LIVING ENVIRONMENT: Lives with: lives with their family and lives with their spouse Lives in: House/apartment Stairs: No Has following equipment at home: None  OCCUPATION: retired  PLOF: Independent and minimal pain,   PATIENT GOALS: have less pain  NEXT MD VISIT:   OBJECTIVE:   DIAGNOSTIC FINDINGS:  Cervical spondylosis  PATIENT SURVEYS:  FOTO 38  COGNITION: Overall cognitive status: Within functional limits for tasks assessed  SENSATION: Right back of head and neck, right upper trap  POSTURE: rounded shoulders and forward head  PALPATION: Mild tenderness in the upper trap and the neck, tight in the right neck area   CERVICAL ROM:   Active ROM A/PROM (deg) eval  Flexion Decreased 25%  Extension Decreased 75%  Right lateral flexion Decreased 75%  Left lateral flexion Decreased 75%  Right rotation Decreased 50%  Left rotation Decreased 50%   (Blank rows = not tested)  UPPER EXTREMITY ROM:  Mild limitations in the shoulders just tight,  right shoulder TSA and left shoulder RC repair UPPER EXTREMITY MMT:  MMT Right eval Left eval  Shoulder flexion 4- 4  Shoulder extension    Shoulder abduction    Shoulder adduction    Shoulder extension    Shoulder internal rotation 3+ 4-  Shoulder external rotation 3+ 4-  Middle trapezius    Lower trapezius    Elbow flexion    Elbow extension    Wrist flexion    Wrist extension    Wrist ulnar deviation    Wrist radial deviation    Wrist pronation    Wrist supination    Grip strength     (Blank rows = not tested)  CERVICAL SPECIAL TESTS:  Spurling's test: Positive  TODAY'S TREATMENT:                                                                                                                              DATE:  02/16/23 UBE level 5 x 6 minutes 10# straight arm pulls 20# AR press Nustep level 5 x 6 minutes Back to wall ER shoulders Ball in lap isometrics Leg press 40# x 10, 60# x10 Farmer carry 20# STM to the neck  02/13/23 Nustep level 6 x 6 minutes 10# straight arm pulls 10# AR press 2x10 15# seated row 2x15 20# lats 2x15 Black tband extension lumbar Leg press 40# 2x10 STM to the  cervical area and the upper trap   01/05/23 Nustep level 5 x 5 minutes Leg extension 5# Leg curls 20# Leg press 40# Seated row 20# Lats 20# 10# straight arms x10 and then 5# x 10 Red tband ER and small abduction at side STM to the cervical spine  01/03/23 Nustep Level 5 x 6 minutes Seated row 20# 2x10 Lats 20# 2x10 Leg press 20# 2x10 Red tband ER back to wall Weighted ball overhead lift 5# shrugs with upper trap and levator stretches 1# W backs with over pressure 25# HS curls 5# leg extension STM to the right neck and upper trap   PATIENT EDUCATION:  Education details: POC and HEP Person educated: Patient Education method: Consulting civil engineer, Media planner, Verbal cues, and Handouts Education comprehension: verbalized understanding  HOME EXERCISE PROGRAM: Access  Code: [email or web address] URL: [email or web address] Date: 12/28/2022 Prepared by: Lum Babe  Exercises - Standing Cervical Retraction  - 1 x daily - 7 x weekly - 2 sets - 10 reps - 3 hold - Standing Cervical Retraction with Sidebending  - 1 x daily - 7 x weekly - 2 sets - 10 reps - 3 hold - Seated Scapular Retraction  - 1 x daily - 7 x weekly - 2 sets - 10 reps - 3 hold - Seated Shoulder Shrugs  - 1 x daily - 7 x weekly - 2 sets - 10 reps - 3 hold  ASSESSMENT:  CLINICAL IMPRESSION: Patient doing well, he feels like he is doing well but would really like to work on strength to help support the neck and the back.  He has had back surgery and bilateral TKA's so I do check with him a lot about pain and discomfort, he gives good effort but when fatigued he does start to stoop forward OBJECTIVE IMPAIRMENTS: decreased ROM, decreased strength, dizziness, increased muscle spasms, impaired flexibility, postural dysfunction, and pain.   REHAB POTENTIAL: Good  CLINICAL DECISION MAKING: Stable/uncomplicated  EVALUATION COMPLEXITY: Low   GOALS: Goals reviewed with patient? Yes  SHORT TERM GOALS: Target date: 01/11/23  Independent with initial HEP Goal status: met  LONG TERM GOALS: Target date: 03/29/23  Understand posture and body mechanics Goal status: progressing 02/13/23  2.  Have a good ergonomic set up for computer and TV use Goal status: progressing 02/13/23  3.  Increase cervical ROM 25% Goal status: progressing   4.  Decrease pain 25% Goal status  ongoing  5.  Increase shoulder ER to 4/5 Goal status: progressing   PLAN:  PT FREQUENCY: 1-2x/week  PT DURATION: 12 weeks  PLANNED INTERVENTIONS: Therapeutic exercises, Therapeutic activity, Neuromuscular re-education, Balance training, Gait training, Patient/Family education, Self Care, Joint mobilization, Vestibular training, Canalith repositioning, Visual/preceptual remediation/compensation, Dry Needling,  Electrical stimulation, Spinal mobilization, Moist heat, Traction, Ultrasound, and Manual therapy  PLAN FOR NEXT SESSION: work on overall strength and function until he leaves on the next trip   Lonita Debes W, PT 02/16/2023, 11:17 AM

## 2023-02-20 ENCOUNTER — Ambulatory Visit: Payer: Medicare PPO | Admitting: Physical Therapy

## 2023-02-20 ENCOUNTER — Encounter: Payer: Self-pay | Admitting: Physical Therapy

## 2023-02-20 DIAGNOSIS — M62830 Muscle spasm of back: Secondary | ICD-10-CM

## 2023-02-20 DIAGNOSIS — M5459 Other low back pain: Secondary | ICD-10-CM

## 2023-02-20 DIAGNOSIS — R252 Cramp and spasm: Secondary | ICD-10-CM

## 2023-02-20 DIAGNOSIS — M542 Cervicalgia: Secondary | ICD-10-CM

## 2023-02-20 NOTE — Therapy (Signed)
OUTPATIENT PHYSICAL THERAPY CERVICAL TREATMENT   Patient Name: George Freeman MRN: [id number] DOB:[birth year], 77 y.o., male Today's Date: 02/20/2023  END OF SESSION:  PT End of Session - 02/20/23 1056     Visit Number 6    Number of Visits 13    Date for PT Re-Evaluation 03/22/23    Authorization Type [employer]    PT Start Time 1051    PT Stop Time 1145    PT Time Calculation (min) 54 min    Activity Tolerance Patient tolerated treatment well    Behavior During Therapy WFL for tasks assessed/performed             Past Medical History:  Diagnosis Date   Anxiety    Arthritis    BPV (benign positional vertigo)    Depression    GERD (gastroesophageal reflux disease)    Neuromuscular disorder (HCC)    Panic disorder    PONV (postoperative nausea and vomiting)    chills and anxiety after surgery   Past Surgical History:  Procedure Laterality Date   [place]  2009   Stenosis, sciatica    BACK SURGERY  2011   Stenosis, sciatica   BACK SURGERY  December 2014   CERVICAL SPINE SURGERY  Feb. 2014   CERVICAL SPINE SURGERY  07/23/15   fusion   COLONOSCOPY     KNEE SURGERY     2x- cartilage   left foot surgery  1985   Left shoulder surgery  1990   Right shoulder surgery  2012   TOTAL KNEE ARTHROPLASTY  January 2011   TOTAL KNEE ARTHROPLASTY  Feb. 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 Yes: NPRS scale: 1/10 Pain location: neck right side and right shoulder  Pain description: ache, numbness, tingling Aggravating factors: turn head to the right and look up pain up to 8/10 Relieving factors: Celebrex 2x/day not move pain can be 0/10   PRECAUTIONS: None  WEIGHT BEARING RESTRICTIONS: No  FALLS:  Has patient fallen in last 6 months? No  LIVING ENVIRONMENT: Lives with: lives with their family and lives with their spouse Lives in: House/apartment Stairs: No Has following equipment at home: None  OCCUPATION: retired  PLOF: Independent and minimal pain,   PATIENT GOALS: have less pain  NEXT MD VISIT:   OBJECTIVE:   DIAGNOSTIC FINDINGS:  Cervical spondylosis  PATIENT SURVEYS:  FOTO 38  COGNITION: Overall cognitive status: Within functional limits for tasks assessed  SENSATION: Right back of head and neck, right upper trap  POSTURE: rounded shoulders and forward head  PALPATION: Mild tenderness in the upper trap and the neck, tight in the right neck area   CERVICAL ROM:   Active ROM A/PROM (deg) eval  Flexion Decreased 25%  Extension Decreased 75%  Right lateral flexion Decreased 75%  Left lateral flexion Decreased 75%  Right rotation Decreased 50%  Left rotation Decreased 50%   (Blank rows = not tested)  UPPER EXTREMITY ROM:  Mild  limitations in the shoulders just tight, right shoulder TSA and left shoulder RC repair UPPER EXTREMITY MMT:  MMT Right eval Left eval  Shoulder flexion 4- 4  Shoulder extension    Shoulder abduction    Shoulder adduction    Shoulder extension    Shoulder internal rotation 3+ 4-  Shoulder external rotation 3+ 4-  Middle trapezius    Lower trapezius    Elbow flexion    Elbow extension    Wrist flexion    Wrist extension    Wrist ulnar deviation    Wrist radial deviation    Wrist pronation    Wrist supination    Grip strength     (Blank rows = not tested)  CERVICAL SPECIAL TESTS:  Spurling's test: Positive  TODAY'S TREATMENT:                                                                                                                              DATE:  02/20/23 35# HS curls 3x10 5# Leg extension 3x10 Seated row 35# 3x10 Lats 35# 3x10 5# overhead army man carry 2 laps 40# leg press 3x10 Feet on ball K2C, trunk rotation, small bridge, isometric abs 5# hip extension and abduction STM to the cervical spine  02/16/23 UBE level 5 x 6 minutes 10# straight arm pulls 20# AR press Nustep level 5 x 6 minutes Back to wall  ER shoulders Ball in lap isometrics Leg press 40# x 10, 60# x10 Farmer carry 20# STM to the neck  02/13/23 Nustep level 6 x 6 minutes 10# straight arm pulls 10# AR press 2x10 15# seated row 2x15 20# lats 2x15 Black tband extension lumbar Leg press 40# 2x10 STM to the cervical area and the upper trap   01/05/23 Nustep level 5 x 5 minutes Leg extension 5# Leg curls 20# Leg press 40# Seated row 20# Lats 20# 10# straight arms x10 and then 5# x 10 Red tband ER and small abduction at side STM to the cervical spine  01/03/23 Nustep Level 5 x 6 minutes Seated row 20# 2x10 Lats 20# 2x10 Leg press 20# 2x10 Red tband ER back to wall Weighted ball overhead lift 5# shrugs with upper trap and levator stretches 1# W backs with over pressure 25# HS  curls 5# leg extension STM to the right neck and upper trap   PATIENT EDUCATION:  Education details: POC and HEP Person educated: Patient Education method: Consulting civil engineer, Media planner, Verbal cues, and Handouts Education comprehension: verbalized understanding  HOME EXERCISE PROGRAM: Access Code: [email or web address] URL: [email or web address] Date: 12/28/2022 Prepared by: Lum Babe  Exercises - Standing Cervical Retraction  - 1 x daily - 7 x weekly - 2 sets - 10 reps - 3 hold - Standing Cervical Retraction with Sidebending  - 1 x daily - 7 x weekly - 2 sets - 10 reps - 3 hold - Seated Scapular Retraction  - 1 x daily - 7 x weekly - 2 sets - 10 reps - 3 hold - Seated Shoulder Shrugs  - 1 x daily - 7 x weekly - 2 sets - 10 reps - 3 hold  ASSESSMENT:  CLINICAL IMPRESSION: I continue to push is overall fitness and function and strength.  He responds well, just needs some good cues for form and posture and to slow down.  He did struggle with the hips some.  Still some tightness int eh cervical mms OBJECTIVE IMPAIRMENTS: decreased ROM, decreased strength, dizziness, increased muscle spasms, impaired flexibility, postural dysfunction, and pain.   REHAB POTENTIAL: Good  CLINICAL DECISION MAKING: Stable/uncomplicated  EVALUATION COMPLEXITY: Low   GOALS: Goals reviewed with patient? Yes  SHORT TERM GOALS: Target date: 01/11/23  Independent with initial HEP Goal status: met  LONG TERM GOALS: Target date: 03/29/23  Understand posture and body mechanics Goal status: progressing 02/13/23  2.  Have a good ergonomic set up for computer and TV use Goal status: progressing 02/13/23  3.  Increase cervical ROM 25% Goal status: progressing   4.  Decrease pain 25% Goal status  ongoing  5.  Increase shoulder ER to 4/5 Goal status: progressing   PLAN:  PT FREQUENCY: 1-2x/week  PT DURATION: 12 weeks  PLANNED INTERVENTIONS: Therapeutic exercises, Therapeutic activity,  Neuromuscular re-education, Balance training, Gait training, Patient/Family education, Self Care, Joint mobilization, Vestibular training, Canalith repositioning, Visual/preceptual remediation/compensation, Dry Needling, Electrical stimulation, Spinal mobilization, Moist heat, Traction, Ultrasound, and Manual therapy  PLAN FOR NEXT SESSION: work on overall strength and function until he leaves on the next trip   Chisom Muntean W, PT 02/20/2023, 10:57 AM

## 2023-02-23 ENCOUNTER — Encounter: Payer: Self-pay | Admitting: Physical Therapy

## 2023-02-23 ENCOUNTER — Ambulatory Visit: Payer: Medicare PPO | Admitting: Physical Therapy

## 2023-02-23 DIAGNOSIS — M5459 Other low back pain: Secondary | ICD-10-CM

## 2023-02-23 DIAGNOSIS — M542 Cervicalgia: Secondary | ICD-10-CM | POA: Diagnosis not present

## 2023-02-23 DIAGNOSIS — M62830 Muscle spasm of back: Secondary | ICD-10-CM

## 2023-02-23 DIAGNOSIS — R252 Cramp and spasm: Secondary | ICD-10-CM

## 2023-02-23 NOTE — Therapy (Signed)
OUTPATIENT PHYSICAL THERAPY CERVICAL TREATMENT   Patient Name: George Freeman MRN: [id number] DOB:1946/11/22, 77 y.o., male Today's Date: 02/23/2023  END OF SESSION:  PT End of Session - 02/23/23 1048     Visit Number 7    Number of Visits 13    Date for PT Re-Evaluation 03/22/23    Authorization Type [employer]    PT Start Time 1048    PT Stop Time 1140    PT Time Calculation (min) 52 min    Activity Tolerance Patient tolerated treatment well    Behavior During Therapy WFL for tasks assessed/performed             Past Medical History:  Diagnosis Date   Anxiety    Arthritis    BPV (benign positional vertigo)    Depression    GERD (gastroesophageal reflux disease)    Neuromuscular disorder (HCC)    Panic disorder    PONV (postoperative nausea and vomiting)    chills and anxiety after surgery   Past Surgical History:  Procedure Laterality Date   [place]  2009   Stenosis, sciatica    BACK SURGERY  2011   Stenosis, sciatica   BACK SURGERY  December 2014   CERVICAL SPINE SURGERY  Feb. 2014   CERVICAL SPINE SURGERY  07/23/15   fusion   COLONOSCOPY     KNEE SURGERY     2x- cartilage   left foot surgery  1985   Left shoulder surgery  1990   Right shoulder surgery  2012   TOTAL KNEE ARTHROPLASTY  January 2011   TOTAL KNEE ARTHROPLASTY  Feb. 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 Yes: NPRS scale: 1/10 Pain location: neck right side and right shoulder  Pain description: ache, numbness, tingling Aggravating factors: turn head to the right and look up pain up to 8/10 Relieving factors: Celebrex 2x/day not move pain can be 0/10   PRECAUTIONS: None  WEIGHT BEARING RESTRICTIONS: No  FALLS:  Has patient fallen in last 6 months? No  LIVING ENVIRONMENT: Lives with: lives with their family and lives with their spouse Lives in: House/apartment Stairs: No Has following equipment at home: None  OCCUPATION: retired  PLOF: Independent and minimal pain,   PATIENT GOALS: have less pain  NEXT MD VISIT:   OBJECTIVE:   DIAGNOSTIC FINDINGS:  Cervical spondylosis  PATIENT SURVEYS:  FOTO 38  COGNITION: Overall cognitive status: Within functional limits for tasks assessed  SENSATION: Right back of head and neck, right upper trap  POSTURE: rounded shoulders and forward head  PALPATION: Mild tenderness in the upper trap and the neck, tight in the right neck area   CERVICAL ROM:   Active ROM A/PROM (deg) eval  Flexion Decreased 25%  Extension Decreased 75%  Right lateral flexion Decreased 75%  Left lateral flexion Decreased 75%  Right rotation Decreased 50%  Left rotation Decreased 50%   (Blank rows = not tested)  UPPER EXTREMITY ROM:  Mild limitations in the shoulders just tight, right shoulder TSA and left shoulder RC  repair UPPER EXTREMITY MMT:  MMT Right eval Left eval  Shoulder flexion 4- 4  Shoulder extension    Shoulder abduction    Shoulder adduction    Shoulder extension    Shoulder internal rotation 3+ 4-  Shoulder external rotation 3+ 4-  Middle trapezius    Lower trapezius    Elbow flexion    Elbow extension    Wrist flexion    Wrist extension    Wrist ulnar deviation    Wrist radial deviation    Wrist pronation    Wrist supination    Grip strength     (Blank rows = not tested)  CERVICAL SPECIAL TESTS:  Spurling's test: Positive  TODAY'S TREATMENT:                                                                                                                              DATE:  02/23/23 Nustep level 5 x 6 minutes 35# HS curls 3x10 5# leg extnesion 3x10 10# Straight arm pulls 35# seated row 35# lats 60# leg press 10# chest press Black tband extension Step on and off airex side step On airex volleyball On airex head turns and eyes closed  02/20/23 35# HS curls 3x10 5# Leg extension 3x10 Seated row 35# 3x10 Lats 35# 3x10 5# overhead army man carry 2 laps 40# leg press 3x10 Feet on ball K2C, trunk rotation, small bridge,  isometric abs 5# hip extension and abduction STM to the cervical spine  02/16/23 UBE level 5 x 6 minutes 10# straight arm pulls 20# AR press Nustep level 5 x 6 minutes Back to wall ER shoulders Ball in lap isometrics Leg press 40# x 10, 60# x10 Farmer carry 20# STM to the neck  02/13/23 Nustep level 6 x 6 minutes 10# straight arm pulls 10# AR press 2x10 15# seated row 2x15 20# lats 2x15 Black tband extension lumbar Leg press 40# 2x10 STM to the cervical area and the upper trap   01/05/23 Nustep level 5 x 5 minutes Leg extension 5# Leg curls 20# Leg press 40# Seated row 20# Lats 20# 10# straight arms x10 and then 5# x 10 Red tband ER and small abduction at side STM to the cervical spine  PATIENT EDUCATION:  Education  details: POC and HEP Person educated: Patient Education method: Consulting civil engineer, Media planner, Verbal cues, and Handouts Education comprehension: verbalized understanding  HOME EXERCISE PROGRAM: Access Code: [email or web address] URL: [email or web address] Date: 12/28/2022 Prepared by: Lum Babe  Exercises - Standing Cervical Retraction  - 1 x daily - 7 x weekly - 2 sets - 10 reps - 3 hold - Standing Cervical Retraction with Sidebending  - 1 x daily - 7 x weekly - 2 sets - 10 reps - 3 hold - Seated Scapular Retraction  - 1 x daily - 7 x weekly - 2 sets - 10 reps - 3 hold - Seated Shoulder Shrugs  - 1 x daily - 7 x weekly - 2 sets - 10 reps - 3 hold  ASSESSMENT:  CLINICAL IMPRESSION: I continue to push is overall fitness and function and strength.  He does have some neuropathy so the airex is difficult for him and the eyes closed is very difficult.  No pain during any exercises. OBJECTIVE IMPAIRMENTS: decreased ROM, decreased strength, dizziness, increased muscle spasms, impaired flexibility, postural dysfunction, and pain.   REHAB POTENTIAL: Good  CLINICAL DECISION MAKING: Stable/uncomplicated  EVALUATION COMPLEXITY: Low   GOALS: Goals reviewed with patient? Yes  SHORT TERM GOALS: Target date: 01/11/23  Independent with initial HEP Goal status: met  LONG TERM GOALS: Target date: 03/29/23  Understand posture and body mechanics Goal status: progressing 02/13/23  2.  Have a good ergonomic set up for computer and TV use Goal status: progressing 02/13/23  3.  Increase cervical ROM 25% Goal status: met 02/23/23  4.  Decrease pain 25% Goal status  ongoing  5.  Increase shoulder ER to 4/5 Goal status: progressing   PLAN:  PT FREQUENCY: 1-2x/week  PT DURATION: 12 weeks  PLANNED INTERVENTIONS: Therapeutic exercises, Therapeutic activity, Neuromuscular re-education, Balance training, Gait training, Patient/Family education, Self Care, Joint mobilization, Vestibular  training, Canalith repositioning, Visual/preceptual remediation/compensation, Dry Needling, Electrical stimulation, Spinal mobilization, Moist heat, Traction, Ultrasound, and Manual therapy  PLAN FOR NEXT SESSION: work on overall strength and function until he leaves on the next trip   Estha Few W, PT 02/23/2023, 10:52 AM

## 2023-03-01 ENCOUNTER — Encounter: Payer: Self-pay | Admitting: Physical Therapy

## 2023-03-01 ENCOUNTER — Ambulatory Visit: Payer: Medicare PPO | Attending: Health Care" | Admitting: Physical Therapy

## 2023-03-01 DIAGNOSIS — M62830 Muscle spasm of back: Secondary | ICD-10-CM | POA: Diagnosis present

## 2023-03-01 DIAGNOSIS — M542 Cervicalgia: Secondary | ICD-10-CM

## 2023-03-01 DIAGNOSIS — R252 Cramp and spasm: Secondary | ICD-10-CM

## 2023-03-01 DIAGNOSIS — M5459 Other low back pain: Secondary | ICD-10-CM

## 2023-03-01 NOTE — Therapy (Signed)
OUTPATIENT PHYSICAL THERAPY CERVICAL TREATMENT   Patient Name: George Freeman MRN: [id number] DOB:[birth year], 77 y.o., male Today's Date: 03/01/2023  END OF SESSION:  PT End of Session - 03/01/23 0847     Visit Number 8    Number of Visits 13    Date for PT Re-Evaluation 03/22/23    Authorization Type [employer]    PT Start Time [phone]    PT Stop Time 0928    PT Time Calculation (min) 45 min    Activity Tolerance Patient tolerated treatment well    Behavior During Therapy WFL for tasks assessed/performed             Past Medical History:  Diagnosis Date   Anxiety    Arthritis    BPV (benign positional vertigo)    Depression    GERD (gastroesophageal reflux disease)    Neuromuscular disorder (HCC)    Panic disorder    PONV (postoperative nausea and vomiting)    chills and anxiety after surgery   Past Surgical History:  Procedure Laterality Date   [place]  2009   Stenosis, sciatica    BACK SURGERY  2011   Stenosis, sciatica   BACK SURGERY  December 2014   CERVICAL SPINE SURGERY  Feb. 2014   CERVICAL SPINE SURGERY  07/23/15   fusion   COLONOSCOPY     KNEE SURGERY     2x- cartilage   left foot surgery  1985   Left shoulder surgery  1990   Right shoulder surgery  2012   TOTAL KNEE ARTHROPLASTY  January 2011   TOTAL KNEE ARTHROPLASTY  Feb. 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 Yes: NPRS scale: 1/10 Pain location: neck right side and right shoulder  Pain description: ache, numbness, tingling Aggravating factors: turn head to the right and look up pain up to 8/10 Relieving factors: Celebrex 2x/day not move pain can be 0/10   PRECAUTIONS: None  WEIGHT BEARING RESTRICTIONS: No  FALLS:  Has patient fallen in last 6 months? No  LIVING ENVIRONMENT: Lives with: lives with their family and lives with their spouse Lives in: House/apartment Stairs: No Has following equipment at home: None  OCCUPATION: retired  PLOF: Independent and minimal pain,   PATIENT GOALS: have less pain  NEXT MD VISIT:   OBJECTIVE:   DIAGNOSTIC FINDINGS:  Cervical spondylosis  PATIENT SURVEYS:  FOTO 38  COGNITION: Overall cognitive status: Within functional limits for tasks assessed  SENSATION: Right back of head and neck, right upper trap  POSTURE: rounded shoulders and forward head  PALPATION: Mild tenderness in the upper trap and the neck, tight in the right neck area   CERVICAL ROM:   Active ROM A/PROM (deg) eval  Flexion Decreased 25%  Extension Decreased 75%  Right lateral flexion Decreased 75%  Left lateral flexion Decreased 75%  Right rotation Decreased 50%  Left rotation Decreased 50%   (Blank rows = not tested)  UPPER EXTREMITY ROM:  Mild  limitations in the shoulders just tight, right shoulder TSA and left shoulder RC repair UPPER EXTREMITY MMT:  MMT Right eval Left eval  Shoulder flexion 4- 4  Shoulder extension    Shoulder abduction    Shoulder adduction    Shoulder extension    Shoulder internal rotation 3+ 4-  Shoulder external rotation 3+ 4-  Middle trapezius    Lower trapezius    Elbow flexion    Elbow extension    Wrist flexion    Wrist extension    Wrist ulnar deviation    Wrist radial deviation    Wrist pronation    Wrist supination    Grip strength     (Blank rows = not tested)  CERVICAL SPECIAL TESTS:  Spurling's test: Positive  TODAY'S TREATMENT:                                                                                                                              DATE:  03/01/23 Nustep level 5 x 6 mintues Leg curls 25# 2x10 Leg extension 5# 2x10 10# straight arm pulls 15# AR press 25# rows 25# lats 40# leg press Feet on ball K2C, trunk rotation, bridge and isometric abs Cervical retraction Scapular retraction Red tband horizontal abduction Doorway stretch  02/23/23 Nustep level 5 x 6 minutes 35# HS curls 3x10 5# leg  extnesion 3x10 10# Straight arm pulls 35# seated row 35# lats 60# leg press 10# chest press Black tband extension Step on and off airex side step On airex volleyball On airex head turns and eyes closed  02/20/23 35# HS curls 3x10 5# Leg extension 3x10 Seated row 35# 3x10 Lats 35# 3x10 5# overhead army man carry 2 laps 40# leg press 3x10 Feet on ball K2C, trunk rotation, small bridge, isometric abs 5# hip extension and abduction STM to the cervical spine  02/16/23 UBE level 5 x 6 minutes 10# straight arm pulls 20# AR press Nustep level 5 x 6 minutes Back to wall ER shoulders Ball in lap isometrics Leg press 40# x 10, 60# x10 Farmer carry 20# STM to the neck  02/13/23 Nustep level 6 x 6 minutes 10# straight arm pulls 10# AR press 2x10 15#  seated row 2x15 20# lats 2x15 Black tband extension lumbar Leg press 40# 2x10 STM to the cervical area and the upper trap   01/05/23 Nustep level 5 x 5 minutes Leg extension 5# Leg curls 20# Leg press 40# Seated row 20# Lats 20# 10# straight arms x10 and then 5# x 10 Red tband ER and small abduction at side STM to the cervical spine  PATIENT EDUCATION:  Education details: POC and HEP Person educated: Patient Education method: Consulting civil engineer, Media planner, Verbal cues, and Handouts Education comprehension: verbalized understanding  HOME EXERCISE PROGRAM: Access Code: [email or web address] URL: [email or web address] Date: 12/28/2022 Prepared by: Lum Babe  Exercises - Standing Cervical Retraction  - 1 x daily - 7 x weekly - 2 sets - 10 reps - 3 hold - Standing Cervical Retraction with Sidebending  - 1 x daily - 7 x weekly - 2 sets - 10 reps - 3 hold - Seated Scapular Retraction  - 1 x daily - 7 x weekly - 2 sets - 10 reps - 3 hold - Seated Shoulder Shrugs  - 1 x daily - 7 x weekly - 2 sets - 10 reps - 3 hold  ASSESSMENT:  CLINICAL IMPRESSION: Patient doing well, he did go to his gym and was able to safely do about 6 exercises, we did discuss that he is welcome to take pix and we could go over this.  He still has some tightness in the neck and is working on his posture for the use of ipad and watching TV.  OBJECTIVE IMPAIRMENTS: decreased ROM, decreased strength, dizziness, increased muscle spasms, impaired flexibility, postural dysfunction, and pain.   REHAB POTENTIAL: Good  CLINICAL DECISION MAKING: Stable/uncomplicated  EVALUATION COMPLEXITY: Low   GOALS: Goals reviewed with patient? Yes  SHORT TERM GOALS: Target date: 01/11/23  Independent with initial HEP Goal status: met  LONG TERM GOALS: Target date: 03/29/23  Understand posture and body mechanics Goal status: progressing 02/13/23  2.  Have a good ergonomic set up for computer and TV use Goal  status: progressing 02/13/23  3.  Increase cervical ROM 25% Goal status: met 02/23/23  4.  Decrease pain 25% Goal status  progressing  5.  Increase shoulder ER to 4/5 Goal status: progressing   PLAN:  PT FREQUENCY: 1-2x/week  PT DURATION: 12 weeks  PLANNED INTERVENTIONS: Therapeutic exercises, Therapeutic activity, Neuromuscular re-education, Balance training, Gait training, Patient/Family education, Self Care, Joint mobilization, Vestibular training, Canalith repositioning, Visual/preceptual remediation/compensation, Dry Needling, Electrical stimulation, Spinal mobilization, Moist heat, Traction, Ultrasound, and Manual therapy  PLAN FOR NEXT SESSION: work on overall strength and function until he leaves on the next trip  Sumner Boast, PT 03/01/2023, 8:47 AM

## 2023-03-06 ENCOUNTER — Encounter: Payer: Self-pay | Admitting: Physical Therapy

## 2023-03-06 ENCOUNTER — Ambulatory Visit: Payer: Medicare PPO | Admitting: Physical Therapy

## 2023-03-06 DIAGNOSIS — M542 Cervicalgia: Secondary | ICD-10-CM

## 2023-03-06 DIAGNOSIS — R252 Cramp and spasm: Secondary | ICD-10-CM

## 2023-03-06 DIAGNOSIS — M62830 Muscle spasm of back: Secondary | ICD-10-CM

## 2023-03-06 DIAGNOSIS — M5459 Other low back pain: Secondary | ICD-10-CM

## 2023-03-06 NOTE — Therapy (Signed)
OUTPATIENT PHYSICAL THERAPY CERVICAL TREATMENT   Patient Name: George Freeman MRN: [id number] DOB:1946/05/24, 77 y.o., male Today's Date: 03/06/2023  END OF SESSION:  PT End of Session - 03/06/23 1107     Visit Number 9    Number of Visits 13    Date for PT Re-Evaluation 03/22/23    Authorization Type [employer]    PT Start Time 1057    PT Stop Time 1143    PT Time Calculation (min) 46 min    Activity Tolerance Patient tolerated treatment well    Behavior During Therapy WFL for tasks assessed/performed             Past Medical History:  Diagnosis Date   Anxiety    Arthritis    BPV (benign positional vertigo)    Depression    GERD (gastroesophageal reflux disease)    Neuromuscular disorder (HCC)    Panic disorder    PONV (postoperative nausea and vomiting)    chills and anxiety after surgery   Past Surgical History:  Procedure Laterality Date   [place]  2009   Stenosis, sciatica    BACK SURGERY  2011   Stenosis, sciatica   BACK SURGERY  December 2014   CERVICAL SPINE SURGERY  Feb. 2014   CERVICAL SPINE SURGERY  07/23/15   fusion   COLONOSCOPY     KNEE SURGERY     2x- cartilage   left foot surgery  1985   Left shoulder surgery  1990   Right shoulder surgery  2012   TOTAL KNEE ARTHROPLASTY  January 2011   TOTAL KNEE ARTHROPLASTY  Feb. 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 Yes: NPRS scale: 1/10 Pain location: neck right side and right shoulder  Pain description: ache, numbness, tingling Aggravating factors: turn head to the right and look up pain up to 8/10 Relieving factors: Celebrex 2x/day not move pain can be 0/10   PRECAUTIONS: None  WEIGHT BEARING RESTRICTIONS: No  FALLS:  Has patient fallen in last 6 months? No  LIVING ENVIRONMENT: Lives with: lives with their family and lives with their spouse Lives in: House/apartment Stairs: No Has following equipment at home: None  OCCUPATION: retired  PLOF: Independent and minimal pain,   PATIENT GOALS: have less pain  NEXT MD VISIT:   OBJECTIVE:   DIAGNOSTIC FINDINGS:  Cervical spondylosis  PATIENT SURVEYS:  FOTO 38  COGNITION: Overall cognitive status: Within functional limits for tasks assessed  SENSATION: Right back of head and neck, right upper trap  POSTURE: rounded shoulders and forward head  PALPATION: Mild tenderness in the upper trap and the neck, tight in the right neck area   CERVICAL ROM:   Active ROM A/PROM (deg) eval  Flexion Decreased 25%  Extension Decreased 75%  Right lateral flexion Decreased 75%  Left lateral flexion Decreased 75%  Right rotation Decreased 50%  Left rotation Decreased 50%   (Blank rows = not tested)  UPPER EXTREMITY ROM:  Mild limitations in the shoulders just tight, right  shoulder TSA and left shoulder RC repair UPPER EXTREMITY MMT:  MMT Right eval Left eval  Shoulder flexion 4- 4  Shoulder extension    Shoulder abduction    Shoulder adduction    Shoulder extension    Shoulder internal rotation 3+ 4-  Shoulder external rotation 3+ 4-  Middle trapezius    Lower trapezius    Elbow flexion    Elbow extension    Wrist flexion    Wrist extension    Wrist ulnar deviation    Wrist radial deviation    Wrist pronation    Wrist supination    Grip strength     (Blank rows = not tested)  CERVICAL SPECIAL TESTS:  Spurling's test: Positive  TODAY'S TREATMENT:                                                                                                                              DATE:  03/06/23 Nustep level 5 x 6 minutes Leg press 40# 3x10 Leg curls 25# 2x15 Leg extension 5# 2x15 10# straight arm pulls 3x10 25# rows 3x10 25# lats 3x10 Passive stretch LE's and some thoracic opening stretches Back to wall red tband horizontal abduction Red tband ER Overhead weighted ball lift Gentle passive stretch to to neck  03/01/23 Nustep level 5 x 6 mintues Leg curls 25# 2x10 Leg extension 5# 2x10 10# straight arm pulls  15# AR press 25# rows 25# lats 40# leg press Feet on ball K2C, trunk rotation, bridge and isometric abs Cervical retraction Scapular retraction Red tband horizontal abduction Doorway stretch  02/23/23 Nustep level 5 x 6 minutes 35# HS curls 3x10 5# leg extnesion 3x10 10# Straight arm pulls 35# seated row 35# lats 60# leg press 10# chest press Black tband extension Step on and off airex side step On airex volleyball On airex head turns and eyes closed  02/20/23 35# HS curls 3x10 5# Leg extension 3x10 Seated row 35# 3x10 Lats 35# 3x10 5# overhead army man carry 2 laps 40# leg press 3x10 Feet on ball K2C, trunk rotation, small bridge, isometric abs 5# hip extension and abduction STM to the cervical  spine  02/16/23 UBE level 5 x 6 minutes 10# straight arm pulls 20# AR press Nustep level 5 x 6 minutes Back to wall ER shoulders Ball in lap isometrics Leg press 40# x 10, 60# x10 Farmer carry 20# STM to the neck  02/13/23 Nustep level 6 x 6 minutes 10# straight arm pulls 10# AR press 2x10 15# seated row 2x15 20# lats 2x15 Black tband extension lumbar Leg press 40# 2x10 STM to the cervical area and the upper trap   01/05/23 Nustep level 5 x 5 minutes Leg extension 5# Leg curls 20# Leg press 40# Seated row 20# Lats 20# 10# straight arms x10 and then 5# x 10 Red tband ER and small abduction at side STM to the cervical spine  PATIENT EDUCATION:  Education details: POC and HEP Person educated: Patient Education method: Consulting civil engineer, Media planner, Verbal cues, and Handouts Education comprehension: verbalized understanding  HOME EXERCISE PROGRAM: Access Code: [email or web address] URL: [email or web address] Date: 12/28/2022 Prepared by: Lum Babe  Exercises - Standing Cervical Retraction  - 1 x daily - 7 x weekly - 2 sets - 10 reps - 3 hold - Standing Cervical Retraction with Sidebending  - 1 x daily - 7 x weekly - 2 sets - 10 reps - 3 hold - Seated Scapular Retraction  - 1 x daily - 7 x weekly - 2 sets - 10 reps - 3 hold - Seated Shoulder Shrugs  - 1 x daily - 7 x weekly - 2 sets - 10 reps - 3 hold  ASSESSMENT:  CLINICAL IMPRESSION: Patient reports that he is still trying to go to the gym 1-2x/week, reports no issues so far.  He reports minimal to no HA's lately and his back is doing better, he thinks the core work helps but still reports that at the end of a walk he is stooped over and his back is hurting.  OBJECTIVE IMPAIRMENTS: decreased ROM, decreased strength, dizziness, increased muscle spasms, impaired flexibility, postural dysfunction, and pain.   REHAB POTENTIAL: Good  CLINICAL DECISION MAKING: Stable/uncomplicated  EVALUATION COMPLEXITY:  Low   GOALS: Goals reviewed with patient? Yes  SHORT TERM GOALS: Target date: 01/11/23  Independent with initial HEP Goal status: met  LONG TERM GOALS: Target date: 03/29/23  Understand posture and body mechanics Goal status: progressing 02/13/23  2.  Have a good ergonomic set up for computer and TV use Goal status: met 03/06/23  3.  Increase cervical ROM 25% Goal status: met 02/23/23  4.  Decrease pain 25% Goal status  progressing  5.  Increase shoulder ER to 4/5 Goal status: met 03/06/23   PLAN:  PT FREQUENCY: 1-2x/week  PT DURATION: 12 weeks  PLANNED INTERVENTIONS: Therapeutic exercises, Therapeutic activity, Neuromuscular re-education, Balance training, Gait training,  Patient/Family education, Self Care, Joint mobilization, Vestibular training, Canalith repositioning, Visual/preceptual remediation/compensation, Dry Needling, Electrical stimulation, Spinal mobilization, Moist heat, Traction, Ultrasound, and Manual therapy  PLAN FOR NEXT SESSION: continue to add strength and functional activities   Alexyss Balzarini W, PT 03/06/2023, 11:08 AM

## 2023-03-08 ENCOUNTER — Encounter: Payer: Self-pay | Admitting: Physical Therapy

## 2023-03-08 ENCOUNTER — Ambulatory Visit: Payer: Medicare PPO | Admitting: Physical Therapy

## 2023-03-08 DIAGNOSIS — M5459 Other low back pain: Secondary | ICD-10-CM

## 2023-03-08 DIAGNOSIS — M62830 Muscle spasm of back: Secondary | ICD-10-CM

## 2023-03-08 DIAGNOSIS — M542 Cervicalgia: Secondary | ICD-10-CM | POA: Diagnosis not present

## 2023-03-08 DIAGNOSIS — R252 Cramp and spasm: Secondary | ICD-10-CM

## 2023-03-08 NOTE — Therapy (Signed)
OUTPATIENT PHYSICAL THERAPY CERVICAL TREATMENT Progress Note Reporting Period 12/28/22 to 03/08/23  See note below for Objective Data and Assessment of Progress/Goals.      Patient Name: George Freeman MRN: [id number] DOB:[birth year], 77 y.o., male Today's Date: 03/08/2023  END OF SESSION:  PT End of Session - 03/08/23 1057     Visit Number 10    Number of Visits 13    Date for PT Re-Evaluation 03/22/23    Authorization Type [employer]    PT Start Time 1048    PT Stop Time 1140    PT Time Calculation (min) 52 min    Activity Tolerance Patient tolerated treatment well    Behavior During Therapy WFL for tasks assessed/performed             Past Medical History:  Diagnosis Date   Anxiety    Arthritis    BPV (benign positional vertigo)    Depression    GERD (gastroesophageal reflux disease)    Neuromuscular disorder (HCC)    Panic disorder    PONV (postoperative nausea and vomiting)    chills and anxiety after surgery   Past Surgical History:  Procedure Laterality Date   [place]  2009   Stenosis, sciatica    BACK SURGERY  2011   Stenosis, sciatica   BACK SURGERY  December 2014   CERVICAL SPINE SURGERY  Feb. 2014   CERVICAL SPINE SURGERY  07/23/15   fusion   COLONOSCOPY     KNEE SURGERY     2x- cartilage   left foot surgery  1985   Left shoulder surgery  1990   Right shoulder surgery  2012   TOTAL KNEE ARTHROPLASTY  January 2011   TOTAL KNEE ARTHROPLASTY  Feb. 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 Yes: NPRS scale: 1/10 Pain location: neck right side and right shoulder  Pain description: ache, numbness, tingling Aggravating factors: turn head to the right and look up pain up to 8/10 Relieving factors: Celebrex 2x/day not move pain can be 0/10   PRECAUTIONS: None  WEIGHT BEARING RESTRICTIONS: No  FALLS:  Has patient fallen in last 6 months? No  LIVING ENVIRONMENT: Lives with: lives with their family and lives with their spouse Lives in: House/apartment Stairs: No Has following equipment at home: None  OCCUPATION: retired  PLOF: Independent and minimal pain,   PATIENT GOALS: have less pain  NEXT MD VISIT:   OBJECTIVE:   DIAGNOSTIC FINDINGS:  Cervical spondylosis  PATIENT SURVEYS:  FOTO 38  COGNITION: Overall cognitive status: Within functional limits for tasks assessed  SENSATION: Right back of head and neck, right upper trap  POSTURE: rounded shoulders and forward head  PALPATION: Mild tenderness in the upper trap and the neck, tight in the right neck area   CERVICAL ROM:   Active ROM A/PROM (deg) eval  Flexion Decreased 25%  Extension Decreased 75%  Right lateral flexion Decreased 75%  Left lateral flexion Decreased 75%  Right rotation  Decreased 50%  Left rotation Decreased 50%   (Blank rows = not tested)  UPPER EXTREMITY ROM:  Mild limitations in the shoulders just tight, right shoulder TSA and left shoulder RC repair UPPER EXTREMITY MMT:  MMT Right eval Left eval  Shoulder flexion 4- 4  Shoulder extension    Shoulder abduction    Shoulder adduction    Shoulder extension    Shoulder internal rotation 3+ 4-  Shoulder external rotation 3+ 4-  Middle trapezius    Lower trapezius    Elbow flexion    Elbow extension    Wrist flexion    Wrist extension    Wrist ulnar deviation    Wrist radial deviation    Wrist pronation    Wrist supination    Grip strength     (Blank rows = not tested)  CERVICAL SPECIAL TESTS:  Spurling's test: Positive  TODAY'S TREATMENT:                                                                                                                              DATE:  03/08/23 Gait outside around the building in the back fast pace UBE level 3 x 4 mintues Went over and performed LE and back stretching that he cn do at home 10# straight arm pulls Red tband back to wall horizontal abduction Leg press 40# 2x10 34# rows 35# lats 5# shrugs with upper trap and levator stretches Doorway stretch  STM  03/06/23 Nustep level 5 x 6 minutes Leg press 40#  3x10 Leg curls 25# 2x15 Leg extension 5# 2x15 10# straight arm pulls 3x10 25# rows 3x10 25# lats 3x10 Passive stretch LE's and some thoracic opening stretches Back to wall red tband horizontal abduction Red tband ER Overhead weighted ball lift Gentle passive stretch to to neck  03/01/23 Nustep level 5 x 6 mintues Leg curls 25# 2x10 Leg extension 5# 2x10 10# straight arm pulls 15# AR press 25# rows 25# lats 40# leg press Feet on ball K2C, trunk rotation, bridge and isometric abs Cervical retraction Scapular retraction Red tband horizontal abduction Doorway stretch  02/23/23 Nustep level 5 x 6 minutes 35# HS curls 3x10 5#  leg extnesion 3x10 10# Straight arm pulls 35# seated row 35# lats 60# leg press 10# chest press Black tband extension Step on and off airex side step On airex volleyball On airex head turns and eyes closed  02/20/23 35# HS curls 3x10 5# Leg extension 3x10 Seated row 35# 3x10 Lats 35# 3x10 5# overhead army man carry 2 laps 40# leg press 3x10 Feet on ball K2C, trunk rotation, small bridge, isometric abs 5# hip extension and abduction STM to the cervical spine  02/16/23 UBE level 5 x 6 minutes 10# straight arm pulls 20# AR press Nustep level 5 x 6 minutes Back to wall ER shoulders Ball in lap isometrics Leg press 40# x 10, 60# x10 Farmer carry 20# STM to the neck  02/13/23 Nustep level 6 x 6 minutes 10# straight arm pulls 10# AR press 2x10 15# seated row 2x15 20# lats 2x15 Black tband extension lumbar Leg press 40# 2x10 STM to the cervical area and the upper trap   01/05/23 Nustep level 5 x 5 minutes Leg extension 5# Leg curls 20# Leg press 40# Seated row 20# Lats 20# 10# straight arms x10 and then 5# x 10 Red tband ER and small abduction at side STM to the cervical spine  PATIENT EDUCATION:  Education details: POC and HEP Person educated: Patient Education method: Consulting civil engineer, Media planner, Verbal cues, and Handouts Education comprehension: verbalized understanding  HOME EXERCISE PROGRAM: Access Code: [email or web address] URL: [email or web address] Date: 12/28/2022 Prepared by: Lum Babe  Exercises - Standing Cervical Retraction  - 1 x daily - 7 x weekly - 2 sets - 10 reps - 3 hold - Standing Cervical Retraction with Sidebending  - 1 x daily - 7 x weekly - 2 sets - 10 reps - 3 hold - Seated Scapular Retraction  - 1 x daily - 7 x weekly - 2 sets - 10 reps - 3 hold - Seated Shoulder Shrugs  - 1 x daily - 7 x weekly - 2 sets - 10 reps - 3 hold  ASSESSMENT:  CLINICAL IMPRESSION: Patient asked about the stiffness he has when he gets up to go  walk, I gave him HEP for flexibility and performed with him in the gym, he felt like he understood and could do it, he does report that the has had some neck pain again as he has been on the ipad more.  We again discussed the posture aspect of this  OBJECTIVE IMPAIRMENTS: decreased ROM, decreased strength, dizziness, increased muscle spasms, impaired flexibility, postural dysfunction, and pain.   REHAB POTENTIAL: Good  CLINICAL DECISION MAKING: Stable/uncomplicated  EVALUATION COMPLEXITY: Low   GOALS: Goals reviewed with patient? Yes  SHORT TERM GOALS: Target date: 01/11/23  Independent with initial HEP Goal status: met  LONG TERM GOALS: Target date: 03/29/23  Understand posture and body mechanics Goal status: progressing 02/13/23  2.  Have a good ergonomic set up for computer and TV use Goal status: met 03/06/23  3.  Increase cervical ROM 25% Goal status: met 02/23/23  4.  Decrease pain 25% Goal status  progressing  5.  Increase shoulder ER to 4/5 Goal status: met 03/06/23   PLAN:  PT FREQUENCY: 1-2x/week  PT DURATION: 12 weeks  PLANNED INTERVENTIONS: Therapeutic exercises, Therapeutic activity, Neuromuscular re-education, Balance training, Gait training, Patient/Family education, Self Care, Joint mobilization, Vestibular training, Canalith repositioning, Visual/preceptual remediation/compensation, Dry Needling, Electrical stimulation, Spinal mobilization, Moist heat, Traction, Ultrasound, and Manual therapy  PLAN FOR NEXT SESSION: continue to add strength and functional activities   Larosa Rhines W, PT 03/08/2023, 10:58 AM

## 2023-03-14 ENCOUNTER — Ambulatory Visit: Payer: Medicare PPO | Admitting: Physical Therapy

## 2023-03-14 ENCOUNTER — Encounter: Payer: Self-pay | Admitting: Physical Therapy

## 2023-03-14 DIAGNOSIS — M542 Cervicalgia: Secondary | ICD-10-CM | POA: Diagnosis not present

## 2023-03-14 DIAGNOSIS — R252 Cramp and spasm: Secondary | ICD-10-CM

## 2023-03-14 DIAGNOSIS — M5459 Other low back pain: Secondary | ICD-10-CM

## 2023-03-14 NOTE — Therapy (Signed)
OUTPATIENT PHYSICAL THERAPY CERVICAL TREATMENT    Patient Name: George Freeman MRN: [id number] DOB:01-16-1946, 77 y.o., male Today's Date: 03/14/2023  END OF SESSION:  PT End of Session - 03/14/23 0942     Visit Number 11    Number of Visits 13    Date for PT Re-Evaluation 03/22/23    Authorization Type [employer]    PT Start Time 0928    PT Stop Time 1012    PT Time Calculation (min) 44 min    Activity Tolerance Patient tolerated treatment well    Behavior During Therapy WFL for tasks assessed/performed             Past Medical History:  Diagnosis Date   Anxiety    Arthritis    BPV (benign positional vertigo)    Depression    GERD (gastroesophageal reflux disease)    Neuromuscular disorder (HCC)    Panic disorder    PONV (postoperative nausea and vomiting)    chills and anxiety after surgery   Past Surgical History:  Procedure Laterality Date   [place]  2009   Stenosis, sciatica    BACK SURGERY  2011   Stenosis, sciatica   BACK SURGERY  December 2014   CERVICAL SPINE SURGERY  Feb. 2014   CERVICAL SPINE SURGERY  07/23/15   fusion   COLONOSCOPY     KNEE SURGERY     2x- cartilage   left foot surgery  1985   Left shoulder surgery  1990   Right shoulder surgery  2012   TOTAL KNEE ARTHROPLASTY  January 2011   TOTAL KNEE ARTHROPLASTY  Feb. 2012   TOTAL SHOULDER ARTHROPLASTY Right 05/23/2017   Procedure: RIGHT TOTAL SHOULDER ARTHROPLASTY;  Surgeon: Meredith Pel, MD;  Location: 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 Yes: NPRS scale: 1/10 Pain location: neck right side and right shoulder  Pain description: ache, numbness, tingling Aggravating factors: turn head to the right and look up pain up to 8/10 Relieving factors: Celebrex 2x/day not move pain can be 0/10   PRECAUTIONS: None  WEIGHT BEARING RESTRICTIONS: No  FALLS:  Has patient fallen in last 6 months? No  LIVING ENVIRONMENT: Lives with: lives with their family and lives with their spouse Lives in: House/apartment Stairs: No Has following equipment at home: None  OCCUPATION: retired  PLOF: Independent and minimal pain,   PATIENT GOALS: have less pain  NEXT MD VISIT:   OBJECTIVE:   DIAGNOSTIC FINDINGS:  Cervical spondylosis  PATIENT SURVEYS:  FOTO 38  COGNITION: Overall cognitive status: Within functional limits for tasks assessed  SENSATION: Right back of head and neck, right upper trap  POSTURE: rounded shoulders and forward head  PALPATION: Mild tenderness in the upper trap and the neck, tight in the right neck area   CERVICAL ROM:   Active ROM A/PROM (deg) eval  Flexion Decreased 25%  Extension Decreased 75%  Right lateral flexion Decreased 75%  Left lateral flexion Decreased 75%  Right rotation Decreased 50%  Left rotation Decreased 50%   (Blank rows = not tested)  UPPER EXTREMITY ROM:  Mild limitations in the  shoulders just tight, right shoulder TSA and left shoulder RC repair UPPER EXTREMITY MMT:  MMT Right eval Left eval  Shoulder flexion 4- 4  Shoulder extension    Shoulder abduction    Shoulder adduction    Shoulder extension    Shoulder internal rotation 3+ 4-  Shoulder external rotation 3+ 4-  Middle trapezius    Lower trapezius    Elbow flexion    Elbow extension    Wrist flexion    Wrist extension    Wrist ulnar deviation    Wrist radial deviation    Wrist pronation    Wrist supination    Grip strength     (Blank rows = not tested)  CERVICAL SPECIAL TESTS:  Spurling's test: Positive  TODAY'S TREATMENT:                                                                                                                              DATE:  03/14/23 UBE level 4 x 6 minutes Seated row 25# LAts 25# Leg curls 25# LEg extesion 5# Triceps 25# Biceps 10# Back to wall 3# W backs Doorway stretch STM to the neck, manual cervical traction/occipital release  03/08/23 Gait outside around the building in the back fast pace UBE level 3 x 4 mintues Went over and performed LE and back stretching that he cn do at  home 10# straight arm pulls Red tband back to wall horizontal abduction Leg press 40# 2x10 34# rows 35# lats 5# shrugs with upper trap and levator stretches Doorway stretch  STM  03/06/23 Nustep level 5 x 6 minutes Leg press 40# 3x10 Leg curls 25# 2x15 Leg extension 5# 2x15 10# straight arm pulls 3x10 25# rows 3x10 25# lats 3x10 Passive stretch LE's and some thoracic opening stretches Back to wall red tband horizontal abduction Red tband ER Overhead weighted ball lift Gentle passive stretch to to neck  03/01/23 Nustep level 5 x 6 mintues Leg curls 25# 2x10 Leg extension 5# 2x10 10# straight arm pulls 15# AR press 25# rows 25# lats 40# leg press Feet on ball K2C, trunk rotation, bridge and isometric abs Cervical retraction Scapular retraction Red tband  horizontal abduction Doorway stretch  02/23/23 Nustep level 5 x 6 minutes 35# HS curls 3x10 5# leg extnesion 3x10 10# Straight arm pulls 35# seated row 35# lats 60# leg press 10# chest press Black tband extension Step on and off airex side step On airex volleyball On airex head turns and eyes closed  02/20/23 35# HS curls 3x10 5# Leg extension 3x10 Seated row 35# 3x10 Lats 35# 3x10 5# overhead army man carry 2 laps 40# leg press 3x10 Feet on ball K2C, trunk rotation, small bridge, isometric abs 5# hip extension and abduction STM to the cervical spine  02/16/23 UBE level 5 x 6 minutes 10# straight arm pulls 20# AR press Nustep level 5 x 6 minutes Back to wall ER shoulders Ball in lap isometrics Leg press 40# x 10, 60# x10 Farmer carry 20# STM to the neck  PATIENT EDUCATION:  Education details: POC and HEP Person educated: Patient Education method: Consulting civil engineer, Media planner, Verbal cues, and Handouts Education comprehension: verbalized understanding  HOME EXERCISE PROGRAM: Access Code: [email or web address] URL: [email or web address] Date: 12/28/2022 Prepared by: Lum Babe  Exercises - Standing Cervical Retraction  - 1 x daily - 7 x weekly - 2 sets - 10 reps - 3 hold - Standing Cervical Retraction with Sidebending  - 1 x daily - 7 x weekly - 2 sets - 10 reps - 3 hold - Seated Scapular Retraction  - 1 x daily - 7 x weekly - 2 sets - 10 reps - 3 hold - Seated Shoulder Shrugs  - 1 x daily - 7 x weekly - 2 sets - 10 reps - 3 hold  ASSESSMENT:  CLINICAL IMPRESSION: We continue the discussion of posture with reading news on the phone and propping the arm up and ergonomic reminders to look up and stretch.  This seems to be his biggest issue, reports back is much better and the neck is only hurting with reading on his phone or ipad  OBJECTIVE IMPAIRMENTS: decreased ROM, decreased strength, dizziness, increased muscle spasms, impaired flexibility, postural  dysfunction, and pain.   REHAB POTENTIAL: Good  CLINICAL DECISION MAKING: Stable/uncomplicated  EVALUATION COMPLEXITY: Low   GOALS: Goals reviewed with patient? Yes  SHORT TERM GOALS: Target date: 01/11/23  Independent with initial HEP Goal status: met  LONG TERM GOALS: Target date: 03/29/23  Understand posture and body mechanics Goal status: progressing 02/13/23  2.  Have a good ergonomic set up for computer and TV use Goal status: met 03/06/23  3.  Increase cervical ROM 25% Goal status: met 02/23/23  4.  Decrease pain 25% Goal status  progressing  5.  Increase shoulder ER to 4/5 Goal status: met 03/06/23   PLAN:  PT  FREQUENCY: 1-2x/week  PT DURATION: 12 weeks  PLANNED INTERVENTIONS: Therapeutic exercises, Therapeutic activity, Neuromuscular re-education, Balance training, Gait training, Patient/Family education, Self Care, Joint mobilization, Vestibular training, Canalith repositioning, Visual/preceptual remediation/compensation, Dry Needling, Electrical stimulation, Spinal mobilization, Moist heat, Traction, Ultrasound, and Manual therapy  PLAN FOR NEXT SESSION: continue to add strength and functional activities   Griffon Herberg W, PT 03/14/2023, 9:42 AM

## 2023-03-16 ENCOUNTER — Encounter: Payer: Self-pay | Admitting: Physical Therapy

## 2023-03-16 ENCOUNTER — Ambulatory Visit: Payer: Medicare PPO | Admitting: Physical Therapy

## 2023-03-16 DIAGNOSIS — M542 Cervicalgia: Secondary | ICD-10-CM | POA: Diagnosis not present

## 2023-03-16 DIAGNOSIS — R252 Cramp and spasm: Secondary | ICD-10-CM

## 2023-03-16 NOTE — Therapy (Signed)
OUTPATIENT PHYSICAL THERAPY CERVICAL TREATMENT    Patient Name: George Freeman MRN: [id number] DOB:[birth year], 77 y.o., male Today's Date: 03/16/2023  END OF SESSION:  PT End of Session - 03/16/23 1101     Visit Number 12    Number of Visits 13    Date for PT Re-Evaluation 03/22/23    Authorization Type [employer]    PT Start Time 1100    PT Stop Time 1150    PT Time Calculation (min) 50 min    Activity Tolerance Patient tolerated treatment well    Behavior During Therapy WFL for tasks assessed/performed             Past Medical History:  Diagnosis Date   Anxiety    Arthritis    BPV (benign positional vertigo)    Depression    GERD (gastroesophageal reflux disease)    Neuromuscular disorder (HCC)    Panic disorder    PONV (postoperative nausea and vomiting)    chills and anxiety after surgery   Past Surgical History:  Procedure Laterality Date   [place]  2009   Stenosis, sciatica    BACK SURGERY  2011   Stenosis, sciatica   BACK SURGERY  December 2014   CERVICAL SPINE SURGERY  Feb. 2014   CERVICAL SPINE SURGERY  07/23/15   fusion   COLONOSCOPY     KNEE SURGERY     2x- cartilage   left foot surgery  1985   Left shoulder surgery  1990   Right shoulder surgery  2012   TOTAL KNEE ARTHROPLASTY  January 2011   TOTAL KNEE ARTHROPLASTY  Feb. 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 Yes: NPRS scale: 1/10 Pain location: neck right side and right shoulder  Pain description: ache, numbness, tingling Aggravating factors: turn head to the right and look up pain up to 8/10 Relieving factors: Celebrex 2x/day not move pain can be 0/10   PRECAUTIONS: None  WEIGHT BEARING RESTRICTIONS: No  FALLS:  Has patient fallen in last 6 months? No  LIVING ENVIRONMENT: Lives with: lives with their family and lives with their spouse Lives in: House/apartment Stairs: No Has following equipment at home: None  OCCUPATION: retired  PLOF: Independent and minimal pain,   PATIENT GOALS: have less pain  NEXT MD VISIT:   OBJECTIVE:   DIAGNOSTIC FINDINGS:  Cervical spondylosis  PATIENT SURVEYS:  FOTO 38  COGNITION: Overall cognitive status: Within functional limits for tasks assessed  SENSATION: Right back of head and neck, right upper trap  POSTURE: rounded shoulders and forward head  PALPATION: Mild tenderness in the upper trap and the neck, tight in the right neck area   CERVICAL ROM:   Active ROM A/PROM (deg) eval  Flexion Decreased 25%  Extension Decreased 75%  Right lateral flexion Decreased 75%  Left lateral flexion Decreased 75%  Right rotation Decreased 50%  Left rotation Decreased 50%   (Blank rows = not tested)  UPPER EXTREMITY ROM:  Mild limitations in the shoulders just tight, right shoulder TSA and left  shoulder RC repair UPPER EXTREMITY MMT:  MMT Right eval Left eval  Shoulder flexion 4- 4  Shoulder extension    Shoulder abduction    Shoulder adduction    Shoulder extension    Shoulder internal rotation 3+ 4-  Shoulder external rotation 3+ 4-  Middle trapezius    Lower trapezius    Elbow flexion    Elbow extension    Wrist flexion    Wrist extension    Wrist ulnar deviation    Wrist radial deviation    Wrist pronation    Wrist supination    Grip strength     (Blank rows = not tested)  CERVICAL SPECIAL TESTS:  Spurling's test: Positive  TODAY'S TREATMENT:                                                                                                                              DATE:  03/16/23 Brisk walk aorund the building in back watching posture 10# straight arm pulls 15# AR press 5# ball rhythmic stabilization facing the wall 3# W backs 7# shrugs with upper trap and levator stretches Green tband ER Green tband horizontal abduction Doorway stretch STM to the neck  03/14/23 UBE level 4 x 6 minutes Seated row 25# LAts 25# Leg curls 25# LEg extesion 5# Triceps 25# Biceps 10# Back to wall  3# W backs Doorway stretch STM to the neck, manual cervical traction/occipital release  03/08/23 Gait outside around the building in the back fast pace UBE level 3 x 4 mintues Went over and performed LE and back stretching that he cn do at home 10# straight arm pulls Red tband back to wall horizontal abduction Leg press 40# 2x10 34# rows 35# lats 5# shrugs with upper trap and levator stretches Doorway stretch  STM  03/06/23 Nustep level 5 x 6 minutes Leg press 40# 3x10 Leg curls 25# 2x15 Leg extension 5# 2x15 10# straight arm pulls 3x10 25# rows 3x10 25# lats 3x10 Passive stretch LE's and some thoracic opening stretches Back to wall red tband horizontal abduction Red tband ER Overhead weighted ball lift Gentle passive stretch to to neck  03/01/23 Nustep  level 5 x 6 mintues Leg curls 25# 2x10 Leg extension 5# 2x10 10# straight arm pulls 15# AR press 25# rows 25# lats 40# leg press Feet on ball K2C, trunk rotation, bridge and isometric abs Cervical retraction Scapular retraction Red tband horizontal abduction Doorway stretch  02/23/23 Nustep level 5 x 6 minutes 35# HS curls 3x10 5# leg extnesion 3x10 10# Straight arm pulls 35# seated row 35# lats 60# leg press 10# chest press Black tband extension Step on and off airex side step On airex volleyball On airex head turns and eyes closed  02/20/23 35# HS curls 3x10 5# Leg extension 3x10 Seated row 35# 3x10 Lats 35# 3x10 5# overhead army man carry 2 laps 40# leg press 3x10 Feet on ball K2C, trunk rotation, small bridge, isometric abs 5# hip extension and abduction STM to the cervical spine  02/16/23 UBE level 5 x 6 minutes 10# straight arm pulls 20# AR press Nustep level 5 x 6 minutes Back to wall ER shoulders Ball in lap isometrics Leg press 40# x 10, 60# x10 Farmer carry 20# STM to the neck  PATIENT EDUCATION:  Education details: POC and HEP Person educated: Patient Education method: Consulting civil engineer, Media planner, Verbal cues, and Handouts Education comprehension: verbalized understanding  HOME EXERCISE PROGRAM: Access Code: [email or web address] URL: [email or web address] Date: 12/28/2022 Prepared by: Lum Babe  Exercises - Standing Cervical Retraction  - 1 x daily - 7 x weekly - 2 sets - 10 reps - 3 hold - Standing Cervical Retraction with Sidebending  - 1 x daily - 7 x weekly - 2 sets - 10 reps - 3 hold - Seated Scapular Retraction  - 1 x daily - 7 x weekly - 2 sets - 10 reps - 3 hold - Seated Shoulder Shrugs  - 1 x daily - 7 x weekly - 2 sets - 10 reps - 3 hold  ASSESSMENT:  CLINICAL IMPRESSION: Patient is doing well I think his awareness of the issue with posture and sitting for longer periods of time is really helping him understand the  importance of posture  OBJECTIVE IMPAIRMENTS: decreased ROM, decreased strength, dizziness, increased muscle spasms, impaired flexibility, postural dysfunction, and pain.   REHAB POTENTIAL: Good  CLINICAL DECISION MAKING: Stable/uncomplicated  EVALUATION COMPLEXITY: Low   GOALS: Goals reviewed with patient? Yes  SHORT TERM GOALS: Target date: 01/11/23  Independent with initial HEP Goal status: met  LONG TERM GOALS: Target date: 03/29/23  Understand posture and body mechanics Goal status: progressing 02/13/23  2.  Have a good ergonomic set up for computer and TV use Goal status: met 03/06/23  3.  Increase cervical ROM 25% Goal status: met 02/23/23  4.  Decrease pain  25% Goal status  progressing  5.  Increase shoulder ER to 4/5 Goal status: met 03/06/23   PLAN:  PT FREQUENCY: 1-2x/week  PT DURATION: 12 weeks  PLANNED INTERVENTIONS: Therapeutic exercises, Therapeutic activity, Neuromuscular re-education, Balance training, Gait training, Patient/Family education, Self Care, Joint mobilization, Vestibular training, Canalith repositioning, Visual/preceptual remediation/compensation, Dry Needling, Electrical stimulation, Spinal mobilization, Moist heat, Traction, Ultrasound, and Manual therapy  PLAN FOR NEXT SESSION: will see one more visit and then D/C, assure HEP and posture  Jonessa Triplett W, PT 03/16/2023, 11:02 AM

## 2023-03-20 ENCOUNTER — Ambulatory Visit: Payer: Medicare PPO | Admitting: Physical Therapy

## 2023-03-20 ENCOUNTER — Encounter: Payer: Self-pay | Admitting: Physical Therapy

## 2023-03-20 DIAGNOSIS — M542 Cervicalgia: Secondary | ICD-10-CM

## 2023-03-20 DIAGNOSIS — M5459 Other low back pain: Secondary | ICD-10-CM

## 2023-03-20 DIAGNOSIS — R252 Cramp and spasm: Secondary | ICD-10-CM

## 2023-03-20 NOTE — Therapy (Signed)
OUTPATIENT PHYSICAL THERAPY CERVICAL TREATMENT    Patient Name: George Freeman MRN: [id number] DOB:1946/09/23, 77 y.o., male Today's Date: 03/20/2023  END OF SESSION:  PT End of Session - 03/20/23 1053     Visit Number 13    Number of Visits 13    Date for PT Re-Evaluation 03/22/23    Authorization Type [employer]    PT Start Time 1053    PT Stop Time 1145    PT Time Calculation (min) 52 min    Activity Tolerance Patient tolerated treatment well    Behavior During Therapy WFL for tasks assessed/performed             Past Medical History:  Diagnosis Date   Anxiety    Arthritis    BPV (benign positional vertigo)    Depression    GERD (gastroesophageal reflux disease)    Neuromuscular disorder (HCC)    Panic disorder    PONV (postoperative nausea and vomiting)    chills and anxiety after surgery   Past Surgical History:  Procedure Laterality Date   [place]  2009   Stenosis, sciatica    BACK SURGERY  2011   Stenosis, sciatica   BACK SURGERY  December 2014   CERVICAL SPINE SURGERY  Feb. 2014   CERVICAL SPINE SURGERY  07/23/15   fusion   COLONOSCOPY     KNEE SURGERY     2x- cartilage   left foot surgery  1985   Left shoulder surgery  1990   Right shoulder surgery  2012   TOTAL KNEE ARTHROPLASTY  January 2011   TOTAL KNEE ARTHROPLASTY  Feb. 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  I know that my posture is a big issue. PERTINENT HISTORY:  Has dizziness and vertigo in the past, 1 level fusion 2019  PAIN:  Are you having pain? Yes: NPRS scale: 1/10 Pain location: neck right side and right shoulder  Pain description: ache, numbness, tingling Aggravating factors: turn head to the right and look up pain up to 8/10 Relieving factors: Celebrex 2x/day not move pain can be 0/10   PRECAUTIONS: None  WEIGHT BEARING RESTRICTIONS: No  FALLS:  Has patient fallen in last 6 months? No  LIVING ENVIRONMENT: Lives with: lives with their family and lives with their spouse Lives in: House/apartment Stairs: No Has following equipment at home: None  OCCUPATION: retired  PLOF: Independent and minimal pain,   PATIENT GOALS: have less pain  NEXT MD VISIT:   OBJECTIVE:   DIAGNOSTIC FINDINGS:  Cervical spondylosis  PATIENT SURVEYS:  FOTO 38  COGNITION: Overall cognitive status: Within functional limits for tasks assessed  SENSATION: Right back of head and neck, right upper trap  POSTURE: rounded shoulders and forward head  PALPATION: Mild tenderness in the upper trap and the neck, tight in the right neck area   CERVICAL ROM:   Active ROM A/PROM (deg) eval  Flexion Decreased 25%  Extension Decreased 75%  Right lateral flexion Decreased 75%  Left lateral flexion Decreased 75%  Right rotation Decreased 50%  Left rotation Decreased 50%   (Blank rows = not tested)  UPPER EXTREMITY ROM:  Mild limitations in the shoulders just tight,  right shoulder TSA and left shoulder RC repair UPPER EXTREMITY MMT:  MMT Right eval Left eval  Shoulder flexion 4- 4  Shoulder extension    Shoulder abduction    Shoulder adduction    Shoulder extension    Shoulder internal rotation 3+ 4-  Shoulder external rotation 3+ 4-  Middle trapezius    Lower trapezius    Elbow flexion    Elbow extension    Wrist flexion    Wrist extension    Wrist ulnar deviation    Wrist radial deviation    Wrist pronation    Wrist supination    Grip strength     (Blank rows = not tested)  CERVICAL SPECIAL TESTS:  Spurling's test: Positive  TODAY'S TREATMENT:                                                                                                                              DATE:  03/20/23 UBE level 4 x 6 minutes 10# straight arm pulls Red tand horizontal abduction Red tband ER 3# W backs 5# shrugs upper trap and levator stretches Doorway stretches Review of posture body mechanics and HEP STM tot he neck and the upper traps  03/16/23 Brisk walk aorund the building in back watching posture 10# straight arm pulls 15# AR press 5# ball rhythmic stabilization facing  the wall 3# W backs 7# shrugs with upper trap and levator stretches Green tband ER Green tband horizontal abduction Doorway stretch STM to the neck  03/14/23 UBE level 4 x 6 minutes Seated row 25# LAts 25# Leg curls 25# LEg extesion 5# Triceps 25# Biceps 10# Back to wall 3# W backs Doorway stretch STM to the neck, manual cervical traction/occipital release  03/08/23 Gait outside around the building in the back fast pace UBE level 3 x 4 mintues Went over and performed LE and back stretching that he cn do at home 10# straight arm pulls Red tband back to wall horizontal abduction Leg press 40# 2x10 34# rows 35# lats 5# shrugs with upper trap and levator stretches Doorway stretch  STM  03/06/23 Nustep level 5 x 6 minutes Leg press 40# 3x10 Leg curls 25#  2x15 Leg extension 5# 2x15 10# straight arm pulls 3x10 25# rows 3x10 25# lats 3x10 Passive stretch LE's and some thoracic opening stretches Back to wall red tband horizontal abduction Red tband ER Overhead weighted ball lift Gentle passive stretch to to neck  03/01/23 Nustep level 5 x 6 mintues Leg curls 25# 2x10 Leg extension 5# 2x10 10# straight arm pulls 15# AR press 25# rows 25# lats 40# leg press Feet on ball K2C, trunk rotation, bridge and isometric abs Cervical retraction Scapular retraction Red tband horizontal abduction Doorway stretch  02/23/23 Nustep level 5 x 6 minutes 35# HS curls 3x10 5# leg extnesion 3x10 10# Straight arm pulls 35# seated row 35# lats 60# leg press 10# chest press Black tband extension Step on and off airex side step On airex volleyball On airex head turns and eyes closed  02/20/23 35# HS curls 3x10 5# Leg extension 3x10 Seated row 35# 3x10 Lats 35# 3x10 5# overhead army man carry 2 laps 40# leg press 3x10 Feet on ball K2C, trunk rotation, small bridge, isometric abs 5# hip extension and abduction STM to the cervical spine  02/16/23 UBE level 5 x 6 minutes 10# straight arm pulls 20# AR press Nustep level 5 x 6 minutes Back to wall ER shoulders Ball in lap isometrics Leg press 40# x 10, 60# x10 Farmer carry 20# STM to the neck  PATIENT EDUCATION:  Education details: POC and HEP Person educated: Patient Education method: Consulting civil engineer, Media planner, Verbal cues, and Handouts Education comprehension: verbalized understanding  HOME EXERCISE PROGRAM: Access Code: [email or web address] URL: [email or web address] Date: 12/28/2022 Prepared by: Lum Babe  Exercises - Standing Cervical Retraction  - 1 x daily - 7 x weekly - 2 sets - 10 reps - 3 hold - Standing Cervical Retraction with Sidebending  - 1 x daily - 7 x weekly - 2 sets - 10 reps - 3 hold - Seated Scapular Retraction  - 1 x daily - 7 x weekly - 2 sets - 10  reps - 3 hold - Seated Shoulder Shrugs  - 1 x daily - 7 x weekly - 2 sets - 10 reps - 3 hold  ASSESSMENT:  CLINICAL IMPRESSION: Patient much more aware of his posture and body mechanics, he reports that he is working on it.  He reports that he will have some difficulty with his HEP as far as motivation to do it at times.  OBJECTIVE IMPAIRMENTS: decreased ROM, decreased strength, dizziness, increased muscle spasms, impaired flexibility, postural dysfunction, and pain.   REHAB POTENTIAL: Good  CLINICAL DECISION MAKING: Stable/uncomplicated  EVALUATION COMPLEXITY: Low   GOALS: Goals reviewed with patient? Yes  SHORT TERM GOALS:  Target date: 01/11/23  Independent with initial HEP Goal status: met  LONG TERM GOALS: Target date: 03/29/23  Understand posture and body mechanics Goal status: met  2.  Have a good ergonomic set up for computer and TV use Goal status: met 03/06/23  3.  Increase cervical ROM 25% Goal status: met 02/23/23  4.  Decrease pain 25% Goal status  met  5.  Increase shoulder ER to 4/5 Goal status: met 03/06/23   PLAN:  PT FREQUENCY: 1-2x/week  PT DURATION: 12 weeks  PLANNED INTERVENTIONS: Therapeutic exercises, Therapeutic activity, Neuromuscular re-education, Balance training, Gait training, Patient/Family education, Self Care, Joint mobilization, Vestibular training, Canalith repositioning, Visual/preceptual remediation/compensation, Dry Needling, Electrical stimulation, Spinal mobilization, Moist heat, Traction, Ultrasound, and Manual therapy  PLAN FOR NEXT SESSION: all goals met D/C  Sumner Boast, PT 03/20/2023, 10:57 AM

## 2023-11-02 ENCOUNTER — Encounter: Payer: Self-pay | Admitting: Physician Assistant

## 2023-11-02 ENCOUNTER — Telehealth: Payer: Self-pay | Admitting: Orthopedic Surgery

## 2023-11-02 ENCOUNTER — Other Ambulatory Visit (INDEPENDENT_AMBULATORY_CARE_PROVIDER_SITE_OTHER): Payer: Medicare PPO

## 2023-11-02 ENCOUNTER — Ambulatory Visit: Payer: Medicare PPO | Admitting: Physician Assistant

## 2023-11-02 DIAGNOSIS — M25512 Pain in left shoulder: Secondary | ICD-10-CM

## 2023-11-02 NOTE — Progress Notes (Signed)
Office Visit Note   Patient: George Freeman           Date of Birth: May 22, 1946           MRN: [id number] Visit Date: 11/02/2023              Requested by: Tracey Harries, MD [address] [place],  [place] [address] PCP: Tracey Harries, MD  Chief Complaint  Patient presents with   Left Shoulder - Pain      HPI: Patient is a pleasant 77 year old gentleman who is a patient of Dr. Diamantina Providence.  He is 6 years status post right total shoulder arthroplasty.  He comes in today with a chief complaint of left arm pain.  He had his COVID-vaccine on Tuesday.  He woke up Wednesday morning with significant pain in his left arm and he thought his shoulder.  Significant enough that he could not move it and was using a sling.  Denies any fever or chills.  Has been icing it thinks it might feel just a little bit better.  Assessment & Plan: Visit Diagnoses:  1. Acute pain of left shoulder     Plan: He is neurovascularly intact today.  He does have forward elevation internal rotation behind his back some pain with external rotation pain is more focused in the deltoid and tricep more than the shoulder with no significant impingement findings.  Does not have any tenderness over the [facility] joint.  With it coinciding with his COVID-vaccine I do think this could be secondary to the injection.  Have told him to take Mobic once a day which she has at home as well as ice this.  He has a regularly scheduled appointment with Dr. August Saucer in 2 weeks.  If he gets worse before then he can call me right away if he still has significant pain could also consider a subacromial injection though again with muscle testing today seems to be more problematic in his triceps and deltoid  Follow-Up Instructions: No follow-ups on file.   Ortho Exam  Patient is alert, oriented, no adenopathy, well-dressed, normal affect, normal respiratory effort. Examination of his left shoulder he has no redness no erythema compartments  are soft compressible grip strength is intact he is neurovascularly intact he has forward elevation negative drop arm test can internally rotate behind the back with some pain external rotation reproduces a small amount of pain negative empty can test negative speeds test does have significant pain with resisted extension of his arm and his triceps.  Imaging: XR Shoulder Left  Result Date: 11/02/2023 Images of his left shoulder demonstrates degenerative changes and calcifications within the [facility] joint.  No evidence of dislocation or fracture  No images are attached to the encounter.  Labs: Lab Results  Component Value Date   HGBA1C 5.1 03/24/2015   REPTSTATUS 05/17/2017 FINAL 05/16/2017   CULT <10,000 COLONIES/mL INSIGNIFICANT GROWTH (A) 05/16/2017     Lab Results  Component Value Date   ALBUMIN 4.2 05/16/2017    No results found for: "MG" No results found for: "VD25OH"  No results found for: "PREALBUMIN"    Latest Ref Rng & Units 05/16/2017   10:36 AM  CBC EXTENDED  WBC 4.0 - 10.5 K/uL 6.5   RBC 4.22 - 5.81 MIL/uL 5.71   Hemoglobin 13.0 - 17.0 g/dL 86.5   HCT 78.4 - 69.6 % 50.4   Platelets 150 - 400 K/uL 147      There is no  height or weight on file to calculate BMI.  Orders:  Orders Placed This Encounter  Procedures   XR Shoulder Left   No orders of the defined types were placed in this encounter.    Procedures: No procedures performed  Clinical Data: No additional findings.  ROS:  All other systems negative, except as noted in the HPI. Review of Systems  Objective: Vital Signs: There were no vitals taken for this visit.  Specialty Comments:  No specialty comments available.  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 02/12/2013   Pain of right heel 12/18/2012   Past Medical  History:  Diagnosis Date   Anxiety    Arthritis    BPV (benign positional vertigo)    Depression    GERD (gastroesophageal reflux disease)    Neuromuscular disorder (HCC)    Panic disorder    PONV (postoperative nausea and vomiting)    chills and anxiety after surgery    Family History  Problem Relation Age of Onset   Diabetes Mother    Alzheimer's disease Sister    Acute myelogenous leukemia Unknown    Neuropathy Neg Hx     Past Surgical History:  Procedure Laterality Date   ANAL FISSURE REPAIR  1993   APPENDECTOMY  1969   BACK SURGERY  2009   Stenosis, sciatica    BACK SURGERY  2011   Stenosis, sciatica   BACK SURGERY  December 2014   CERVICAL SPINE SURGERY  Feb. 2014   CERVICAL SPINE SURGERY  07/23/15   fusion   COLONOSCOPY     KNEE SURGERY     2x- cartilage   left foot surgery  1985   Left shoulder surgery  1990   Right shoulder surgery  2012   TOTAL KNEE ARTHROPLASTY  January 2011   TOTAL KNEE ARTHROPLASTY  Feb. 2012   TOTAL SHOULDER ARTHROPLASTY Right 05/23/2017   Procedure: RIGHT TOTAL SHOULDER ARTHROPLASTY;  Surgeon: Cammy Copa, MD;  Location: [facility];  Service: Orthopedics;  Laterality: Right;   VASECTOMY     Social History   Occupational History   Occupation: Retired  Tobacco Use   Smoking status: Never   Smokeless tobacco: Never  Vaping Use   Vaping status: Never Used  Substance and Sexual Activity   Alcohol use: No    Alcohol/week: 0.0 standard drinks of alcohol   Drug use: No   Sexual activity: Not on file

## 2023-11-02 NOTE — Telephone Encounter (Signed)
Patient called and said his arm is in a splint and when he lift it, he in a lot of pain. Now Tuesday he took a covid vaccine and he wanted to know is it ok to get something for pain?[phone]

## 2023-11-02 NOTE — Telephone Encounter (Signed)
I called. Scheduled appt to see [place] today to start work up for shoulder pain and decreased ROM. Scheduled f/u appt as well with Dr August Saucer 11/25

## 2023-11-13 ENCOUNTER — Telehealth: Payer: Self-pay | Admitting: Orthopedic Surgery

## 2023-11-13 NOTE — Telephone Encounter (Signed)
Pt called in for an appt he had some concerns about his shoulder and getting injection because he got his covid vaccine he did see [facility] Persons on 11/07 and he is still having pain pt was scheduled for 12/04 which was the next available appt but his wife is coming in on 11/21 at 3:45 and he was wondering if he could also see August Saucer when she comes in. I advised that is not something we usually do and we do not know if he needs to be out the clinic at a certain time.

## 2023-11-14 NOTE — Telephone Encounter (Signed)
Patient worked in for Wednesday.Marland KitchenMarland Kitchen

## 2023-11-15 ENCOUNTER — Ambulatory Visit: Payer: Medicare PPO | Admitting: Orthopedic Surgery

## 2023-11-15 DIAGNOSIS — M7552 Bursitis of left shoulder: Secondary | ICD-10-CM | POA: Diagnosis not present

## 2023-11-17 ENCOUNTER — Encounter: Payer: Self-pay | Admitting: Orthopedic Surgery

## 2023-11-17 NOTE — Progress Notes (Unsigned)
Office Visit Note   Patient: George Freeman           Date of Birth: November 08, 1946           MRN: [id number] Visit Date: 11/15/2023 Requested by: Tracey Harries, MD [address] [place],  [place] [address] PCP: Tracey Harries, MD  Subjective: Chief Complaint  Patient presents with   Left Shoulder - Pain    HPI: George Freeman is a 77 y.o. male who presents to the office reporting left shoulder and arm pain.  Patient had COVID-vaccine 10/31/2023.  Symptoms started after that injection.  Patient reports radiating pain but no numbness and tingling no neck pain.  He is right-hand dominant.  Has had reverse replacement on that right-hand side.  Radiographs unremarkable.  Mild arthritis present but acromiohumeral distance maintained.  Most of his pain occurs with abduction.  Patient states 2 days after the injection he could not move his arm.  Ice and Mobic have helped.  Pain does wake him from sleep at night..                ROS: All systems reviewed are negative as they relate to the chief complaint within the history of present illness.  Patient denies fevers or chills.  Assessment & Plan: Visit Diagnoses: No diagnosis found.  Plan: Impression is left shoulder pain following COVID injection.  Hard to say exactly what is at play here but I think his pain radiation pattern is consistent with bursitis.  Rotator cuff strength is reasonable.  Subacromial injection performed today and we will see how he does.  Follow-Up Instructions: No follow-ups on file.   Orders:  No orders of the defined types were placed in this encounter.  No orders of the defined types were placed in this encounter.     Procedures: Large Joint Inj: L subacromial bursa on 11/15/2023 5:24 PM Indications: diagnostic evaluation and pain Details: 18 G 1.5 in needle, posterior approach  Arthrogram: No  Medications: 9 mL bupivacaine 0.5 %; 40 mg methylPREDNISolone acetate 40 MG/ML; 5 mL lidocaine 1  % Outcome: tolerated well, no immediate complications Procedure, treatment alternatives, risks and benefits explained, specific risks discussed. Consent was given by the patient. Immediately prior to procedure a time out was called to verify the correct patient, procedure, equipment, support staff and site/side marked as required. Patient was prepped and draped in the usual sterile fashion.       Clinical Data: No additional findings.  Objective: Vital Signs: There were no vitals taken for this visit.  Physical Exam:  Constitutional: Patient appears well-developed HEENT:  Head: Normocephalic Eyes:EOM are normal Neck: Normal range of motion Cardiovascular: Normal rate Pulmonary/chest: Effort normal Neurologic: Patient is alert Skin: Skin is warm Psychiatric: Patient has normal mood and affect  Ortho Exam: Ortho exam demonstrates good rotator cuff strength infraspinatus supraspinatus and subscap muscle testing on that left-hand side.  Does have pain with abduction.  No restriction of external rotation at 50 degrees of abduction left versus right.  Neck range of motion intact.  No paresthesias C5-T1.  Specialty Comments:  No specialty comments available.  Imaging: No results found.   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 02/12/2013   Pain of right heel 12/18/2012   Past Medical History:  Diagnosis Date   Anxiety  Arthritis    BPV (benign positional vertigo)    Depression    GERD (gastroesophageal reflux disease)    Neuromuscular disorder (HCC)    Panic disorder    PONV (postoperative nausea and vomiting)    chills and anxiety after surgery    Family History  Problem Relation Age of Onset   Diabetes Mother    Alzheimer's disease Sister    Acute myelogenous leukemia Unknown     Neuropathy Neg Hx     Past Surgical History:  Procedure Laterality Date   ANAL FISSURE REPAIR  1993   APPENDECTOMY  1969   BACK SURGERY  2009   Stenosis, sciatica    BACK SURGERY  2011   Stenosis, sciatica   BACK SURGERY  December 2014   CERVICAL SPINE SURGERY  Feb. 2014   CERVICAL SPINE SURGERY  07/23/15   fusion   COLONOSCOPY     KNEE SURGERY     2x- cartilage   left foot surgery  1985   Left shoulder surgery  1990   Right shoulder surgery  2012   TOTAL KNEE ARTHROPLASTY  January 2011   TOTAL KNEE ARTHROPLASTY  Feb. 2012   TOTAL SHOULDER ARTHROPLASTY Right 05/23/2017   Procedure: RIGHT TOTAL SHOULDER ARTHROPLASTY;  Surgeon: Cammy Copa, MD;  Location: [facility];  Service: Orthopedics;  Laterality: Right;   VASECTOMY     Social History   Occupational History   Occupation: Retired  Tobacco Use   Smoking status: Never   Smokeless tobacco: Never  Vaping Use   Vaping status: Never Used  Substance and Sexual Activity   Alcohol use: No    Alcohol/week: 0.0 standard drinks of alcohol   Drug use: No   Sexual activity: Not on file

## 2023-11-19 MED ORDER — LIDOCAINE HCL 1 % IJ SOLN
5.0000 mL | INTRAMUSCULAR | Status: AC | PRN
  Administered 2023-11-15: 5 mL

## 2023-11-19 MED ORDER — METHYLPREDNISOLONE ACETATE 40 MG/ML IJ SUSP
40.0000 mg | INTRAMUSCULAR | Status: AC | PRN
  Administered 2023-11-15: 40 mg via INTRA_ARTICULAR

## 2023-11-19 MED ORDER — BUPIVACAINE HCL 0.5 % IJ SOLN
9.0000 mL | INTRAMUSCULAR | Status: AC | PRN
  Administered 2023-11-15: 9 mL via INTRA_ARTICULAR

## 2023-11-20 ENCOUNTER — Ambulatory Visit: Payer: Medicare PPO | Admitting: Orthopedic Surgery

## 2023-11-29 ENCOUNTER — Ambulatory Visit: Payer: Medicare PPO | Admitting: Orthopedic Surgery

## 2023-11-29 ENCOUNTER — Telehealth: Payer: Self-pay | Admitting: Orthopedic Surgery

## 2023-11-29 DIAGNOSIS — M7552 Bursitis of left shoulder: Secondary | ICD-10-CM

## 2023-11-29 DIAGNOSIS — M25512 Pain in left shoulder: Secondary | ICD-10-CM

## 2023-11-29 NOTE — Telephone Encounter (Signed)
Patient called asked if he can get set up to have an MRI for his left shoulder? The number to contact patient is [phone]

## 2023-11-29 NOTE — Telephone Encounter (Signed)
Okay for MRI arthrogram left shoulder evaluate for rotator cuff tear versus biceps tendon problem.  Thanks

## 2023-11-30 NOTE — Telephone Encounter (Signed)
Mri ordered 

## 2023-11-30 NOTE — Telephone Encounter (Signed)
Lvm advising  

## 2023-12-08 ENCOUNTER — Ambulatory Visit
Admission: RE | Admit: 2023-12-08 | Discharge: 2023-12-08 | Disposition: A | Discharge status: Home / Self Care | Payer: Medicare PPO | Source: Ambulatory Visit | Attending: Orthopedic Surgery | Admitting: Orthopedic Surgery

## 2023-12-08 DIAGNOSIS — M25512 Pain in left shoulder: Secondary | ICD-10-CM

## 2023-12-08 DIAGNOSIS — M7552 Bursitis of left shoulder: Secondary | ICD-10-CM

## 2023-12-08 MED ORDER — IOPAMIDOL (ISOVUE-M 200) INJECTION 41%
13.0000 mL | Freq: Once | INTRAMUSCULAR | Status: AC
  Administered 2023-12-08: 13 mL via INTRA_ARTICULAR

## 2023-12-11 ENCOUNTER — Other Ambulatory Visit: Payer: Self-pay

## 2023-12-11 ENCOUNTER — Encounter: Payer: Self-pay | Admitting: Orthopedic Surgery

## 2023-12-11 ENCOUNTER — Ambulatory Visit: Payer: Medicare PPO | Admitting: Orthopedic Surgery

## 2023-12-11 DIAGNOSIS — M25512 Pain in left shoulder: Secondary | ICD-10-CM | POA: Diagnosis not present

## 2023-12-11 DIAGNOSIS — M65912 Unspecified synovitis and tenosynovitis, left shoulder: Secondary | ICD-10-CM

## 2023-12-11 MED ORDER — CELECOXIB 100 MG PO CAPS: 100.0000 mg | ORAL_CAPSULE | Freq: Two times a day (BID) | ORAL | 0 refills | Status: DC

## 2023-12-11 MED ORDER — BUPIVACAINE HCL 0.5 % IJ SOLN
9.0000 mL | INTRAMUSCULAR | Status: AC | PRN
  Administered 2023-12-11: 9 mL via INTRA_ARTICULAR

## 2023-12-11 MED ORDER — LIDOCAINE HCL 1 % IJ SOLN
5.0000 mL | INTRAMUSCULAR | Status: AC | PRN
  Administered 2023-12-11: 5 mL

## 2023-12-11 MED ORDER — METHYLPREDNISOLONE ACETATE 40 MG/ML IJ SUSP
40.0000 mg | INTRAMUSCULAR | Status: AC | PRN
  Administered 2023-12-11: 40 mg via INTRA_ARTICULAR

## 2023-12-11 NOTE — Progress Notes (Signed)
Office Visit Note   Patient: George Freeman           Date of Birth: 02-04-1946           MRN: [id number] Visit Date: 12/11/2023 Requested by: Tracey Harries, MD [address] [place],  [place] [address] PCP: Tracey Harries, MD  Subjective: No chief complaint on file.   HPI: George Freeman is a 77 y.o. male who presents to the office reporting left shoulder pain.  Patient had a COVID injection into his left shoulder several months ago with immediate onset of pain in his shoulder has been painful since.  Forward flexion not a problem abduction definitely is a problem.  MRI scan has not been read yet but on my review he does not have any rotator cuff tearing.  Biceps tendon looks like it has some tendinosis at the distal intra-articular portion.  Not too much in terms of glenohumeral arthritis.  No full-thickness rotator cuff tear..                ROS: All systems reviewed are negative as they relate to the chief complaint within the history of present illness.  Patient denies fevers or chills.  Assessment & Plan: Visit Diagnoses: No diagnosis found.  Plan: Impression is structurally intact left shoulder.  He may be getting a little bit of an early frozen shoulder based on restriction of external rotation only.  He does have excellent passive forward flexion.  Plan at this time is intra-articular glenohumeral joint injection performed under ultrasound guidance.  Subacromial injection gave him 2 or 3 days of relief but then his symptoms recurred.  I think we could try this injection today for diagnostic and therapeutic purposes.  Also counseled him that Celebrex 200 mg twice a day is a dose that is a little too high.  Recommended 100 mg twice a day.  He will come back as needed.  Follow-Up Instructions: No follow-ups on file.   Orders:  No orders of the defined types were placed in this encounter.  No orders of the defined types were placed in this encounter.      Procedures: Large Joint Inj: L glenohumeral on 12/11/2023 6:31 PM Indications: diagnostic evaluation and pain Details: 22 G 1.5 in and 3.5 in needle, ultrasound-guided posterior approach  Arthrogram: No  Medications: 9 mL bupivacaine 0.5 %; 40 mg methylPREDNISolone acetate 40 MG/ML; 5 mL lidocaine 1 % Outcome: tolerated well, no immediate complications Procedure, treatment alternatives, risks and benefits explained, specific risks discussed. Consent was given by the patient. Immediately prior to procedure a time out was called to verify the correct patient, procedure, equipment, support staff and site/side marked as required. Patient was prepped and draped in the usual sterile fashion.       Clinical Data: No additional findings.  Objective: Vital Signs: There were no vitals taken for this visit.  Physical Exam:  Constitutional: Patient appears well-developed HEENT:  Head: Normocephalic Eyes:EOM are normal Neck: Normal range of motion Cardiovascular: Normal rate Pulmonary/chest: Effort normal Neurologic: Patient is alert Skin: Skin is warm Psychiatric: Patient has normal mood and affect  Ortho Exam: Ortho exam demonstrates range of motion on the left of 30/100/170.  Rotator cuff strength intact infraspinatus supraspinatus and subscap muscle testing.  No coarseness or grinding is present.  Does have pain with both passive and active abduction and this does not localize to the [facility] joint.  Specialty Comments:  No specialty comments available.  Imaging:  No results found.   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 02/12/2013   Pain of right heel 12/18/2012   Past Medical History:  Diagnosis Date   Anxiety    Arthritis    BPV (benign positional vertigo)    Depression    GERD (gastroesophageal  reflux disease)    Neuromuscular disorder (HCC)    Panic disorder    PONV (postoperative nausea and vomiting)    chills and anxiety after surgery    Family History  Problem Relation Age of Onset   Diabetes Mother    Alzheimer's disease Sister    Acute myelogenous leukemia Unknown    Neuropathy Neg Hx     Past Surgical History:  Procedure Laterality Date   ANAL FISSURE REPAIR  1993   APPENDECTOMY  1969   BACK SURGERY  2009   Stenosis, sciatica    BACK SURGERY  2011   Stenosis, sciatica   BACK SURGERY  December 2014   CERVICAL SPINE SURGERY  Feb. 2014   CERVICAL SPINE SURGERY  07/23/15   fusion   COLONOSCOPY     KNEE SURGERY     2x- cartilage   left foot surgery  1985   Left shoulder surgery  1990   Right shoulder surgery  2012   TOTAL KNEE ARTHROPLASTY  January 2011   TOTAL KNEE ARTHROPLASTY  Feb. 2012   TOTAL SHOULDER ARTHROPLASTY Right 05/23/2017   Procedure: RIGHT TOTAL SHOULDER ARTHROPLASTY;  Surgeon: Cammy Copa, MD;  Location: [facility] OR;  Service: Orthopedics;  Laterality: Right;   VASECTOMY     Social History   Occupational History   Occupation: Retired  Tobacco Use   Smoking status: Never   Smokeless tobacco: Never  Vaping Use   Vaping status: Never Used  Substance and Sexual Activity   Alcohol use: No    Alcohol/week: 0.0 standard drinks of alcohol   Drug use: No   Sexual activity: Not on file

## 2024-01-25 ENCOUNTER — Other Ambulatory Visit: Payer: Self-pay | Admitting: Orthopedic Surgery

## 2024-04-02 ENCOUNTER — Encounter: Payer: Self-pay | Admitting: Physical Therapy

## 2024-04-02 ENCOUNTER — Ambulatory Visit: Attending: Family Medicine | Admitting: Physical Therapy

## 2024-04-02 DIAGNOSIS — R252 Cramp and spasm: Secondary | ICD-10-CM | POA: Diagnosis present

## 2024-04-02 DIAGNOSIS — M62830 Muscle spasm of back: Secondary | ICD-10-CM | POA: Insufficient documentation

## 2024-04-02 DIAGNOSIS — M25512 Pain in left shoulder: Secondary | ICD-10-CM | POA: Insufficient documentation

## 2024-04-02 DIAGNOSIS — M542 Cervicalgia: Secondary | ICD-10-CM | POA: Insufficient documentation

## 2024-04-02 DIAGNOSIS — M5459 Other low back pain: Secondary | ICD-10-CM | POA: Insufficient documentation

## 2024-04-02 NOTE — Therapy (Signed)
 OUTPATIENT PHYSICAL THERAPY CERVICAL/LUMBAR EVALUATION   Patient Name: George Freeman MRN: [id number] DOB:May 04, 1946, 78 y.o., male Today's Date: 04/02/2024  END OF SESSION:  PT End of Session - 04/02/24 1611     Visit Number 1    Number of Visits 13    Date for PT Re-Evaluation 07/02/24    Authorization Type [employer]    PT Start Time 1603    PT Stop Time 1700    PT Time Calculation (min) 57 min    Activity Tolerance Patient tolerated treatment well    Behavior During Therapy WFL for tasks assessed/performed             Past Medical History:  Diagnosis Date   Anxiety    Arthritis    BPV (benign positional vertigo)    Depression    GERD (gastroesophageal reflux disease)    Neuromuscular disorder (HCC)    Panic disorder    PONV (postoperative nausea and vomiting)    chills and anxiety after surgery   Past Surgical History:  Procedure Laterality Date   ANAL FISSURE REPAIR  1993   APPENDECTOMY  1969   BACK SURGERY  2009   Stenosis, sciatica    BACK SURGERY  2011   Stenosis, sciatica   BACK SURGERY  December 2014   CERVICAL SPINE SURGERY  Feb. 2014   CERVICAL SPINE SURGERY  07/23/15   fusion   COLONOSCOPY     KNEE SURGERY     2x- cartilage   left foot surgery  1985   Left shoulder surgery  1990   Right shoulder surgery  2012   TOTAL KNEE ARTHROPLASTY  January 2011   TOTAL KNEE ARTHROPLASTY  Feb. 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  MD reported spondylosis and did not think surgery was warranted.  Has some tingling in the right shoulder area and in the back of the head.  HE also has LBP.  Reports left shoulder pain and loss of ROM after a vaccination  PERTINENT HISTORY:  Has dizziness and vertigo in the past, 1 level fusion 2019  PAIN:  Are you having pain? Yes: NPRS scale: 3/10 Pain location: neck right side and right shoulder, low back  Pain description: ache, numbness, tingling Aggravating factors: turn head to the right and look up , walkingpain up to 8/10 Relieving factors: Celebrex 1x/day, not move pain can be 0/10, he is currently trying to not take the pain medicine   PRECAUTIONS: None  WEIGHT BEARING RESTRICTIONS: No  FALLS:  Has patient fallen in last 6 months? No  LIVING ENVIRONMENT: Lives with: lives with their family and lives with their spouse Lives in: House/apartment Stairs: No Has following equipment at home: None  OCCUPATION: retired  PLOF: Independent and minimal pain,   PATIENT GOALS: have less pain  NEXT MD VISIT:   OBJECTIVE:   DIAGNOSTIC FINDINGS:  Cervical spondylosis  COGNITION: Overall cognitive status: Within functional limits for tasks assessed  SENSATION: Right back of head and neck, right upper trap  POSTURE: rounded shoulders and forward head, decreased lordosis  PALPATION: Mild tenderness in the upper trap and the neck, tight in the right neck area, tight in the lumbar  area  LUMBAR ROM:  Decreased 50% for flexion, decreased 75% for other motions CERVICAL ROM:   Active ROM A/PROM (deg) eval  Flexion Decreased 25%  Extension Decreased 75%  Right lateral flexion Decreased 75%  Left lateral flexion Decreased 75%  Right rotation Decreased 50%  Left rotation Decreased 50%   (Blank rows = not tested)  UPPER EXTREMITY ROM:  Mild limitations in the shoulders just tight, right shoulder UPPER EXTREMITY MMT:  MMT Right eval Left eval  Shoulder flexion 4- 4  Shoulder extension    Shoulder abduction    Shoulder adduction    Shoulder extension    Shoulder internal rotation 3+ 4-  Shoulder external rotation 3+ 4-  Middle trapezius    Lower trapezius    Elbow flexion    Elbow extension    Wrist flexion    Wrist extension    Wrist ulnar deviation    Wrist radial deviation    Wrist pronation    Wrist supination    Grip strength     (Blank rows = not tested)  CERVICAL SPECIAL TESTS:  Spurling's test: Positive Very tight HS and piriformis ODI= 23/50 = 56% BERG 43/56  TODAY'S TREATMENT:                                                                                                                              DATE:  04/02/24 Evaluation  PATIENT EDUCATION:  Education details: POC and HEP Person educated: Patient Education method: Programmer, multimedia, Facilities manager, Verbal cues, and Handouts Education comprehension: verbalized understanding  HOME EXERCISE PROGRAM: Access Code: [email or web address] URL: [email or web address] Date: 04/02/24 Prepared by: Stacie Glaze  Exercises - Standing Cervical Retraction  - 1 x daily - 7 x weekly - 2 sets - 10 reps - 3 hold - Standing Cervical Retraction with Sidebending  - 1 x daily - 7 x weekly - 2 sets - 10 reps - 3 hold - Seated Scapular Retraction  - 1 x daily - 7 x weekly - 2 sets - 10 reps - 3 hold - Seated Shoulder Shrugs  - 1 x daily - 7 x weekly - 2 sets - 10 reps - 3  hold  ASSESSMENT:  CLINICAL IMPRESSION: Patient is a 78 y.o. male who was seen today for physical therapy evaluation and treatment for neck pain with some radiculopathy, back pain and some balance issues.  .  Had a one level fusion in 2019, MD diagnosis is spondylosis.  Berg balance 43/56, very limited cervical and lumbar ROM, poor core  OBJECTIVE IMPAIRMENTS: decreased ROM, decreased strength, dizziness, increased muscle spasms, impaired flexibility, postural dysfunction, and pain.   REHAB POTENTIAL: Good  CLINICAL DECISION MAKING: Stable/uncomplicated  EVALUATION COMPLEXITY: Low   GOALS: Goals reviewed with patient? Yes  SHORT TERM GOALS: Target date: 05/02/24  Independent with initial HEP Goal status: INITIAL  LONG TERM GOALS: Target date: 07/02/24  Understand posture and body mechanics Goal status: INITIAL  2.  Have a good ergonomic set up for computer and TV use Goal status: INITIAL  3.  Increase cervical ROM 25% Goal status: INITIAL  4.  Decrease pain 25% Goal status: INITIAL  5.  Berg balance test to 48/56 Goal status: INITIAL   PLAN:  PT FREQUENCY: 1-2x/week  PT DURATION: 12 weeks  PLANNED INTERVENTIONS: Therapeutic exercises, Therapeutic activity, Neuromuscular re-education, Balance training, Gait training, Patient/Family education, Self Care, Joint mobilization, Vestibular training, Canalith repositioning, Visual/preceptual remediation/compensation, Dry Needling, Electrical stimulation, Spinal mobilization, Moist heat, Traction, Ultrasound, and Manual therapy  PLAN FOR NEXT SESSION: he will be travelling a lot and we will need to spread out his visits over a longer period of time, he is leaving in two weeks   Decorey Wahlert W, PT 04/02/2024, 4:13 PM

## 2024-04-04 ENCOUNTER — Encounter: Payer: Self-pay | Admitting: Physical Therapy

## 2024-04-04 ENCOUNTER — Ambulatory Visit: Admitting: Physical Therapy

## 2024-04-04 DIAGNOSIS — M542 Cervicalgia: Secondary | ICD-10-CM

## 2024-04-04 DIAGNOSIS — M5459 Other low back pain: Secondary | ICD-10-CM

## 2024-04-04 DIAGNOSIS — R252 Cramp and spasm: Secondary | ICD-10-CM

## 2024-04-04 DIAGNOSIS — M62830 Muscle spasm of back: Secondary | ICD-10-CM

## 2024-04-04 NOTE — Therapy (Signed)
 OUTPATIENT PHYSICAL THERAPY CERVICAL/LUMBAR TREATMENT   Patient Name: George Freeman MRN: [id number] DOB:1946-11-16, 78 y.o., male Today's Date: 04/04/2024  END OF SESSION:  PT End of Session - 04/04/24 1356     Visit Number 2    Number of Visits 13    Date for PT Re-Evaluation 07/02/24    Authorization Type [employer]    PT Start Time 1355    PT Stop Time 1440    PT Time Calculation (min) 45 min    Activity Tolerance Patient tolerated treatment well    Behavior During Therapy WFL for tasks assessed/performed             Past Medical History:  Diagnosis Date   Anxiety    Arthritis    BPV (benign positional vertigo)    Depression    GERD (gastroesophageal reflux disease)    Neuromuscular disorder (HCC)    Panic disorder    PONV (postoperative nausea and vomiting)    chills and anxiety after surgery   Past Surgical History:  Procedure Laterality Date   ANAL FISSURE REPAIR  1993   APPENDECTOMY  1969   BACK SURGERY  2009   Stenosis, sciatica    BACK SURGERY  2011   Stenosis, sciatica   BACK SURGERY  December 2014   CERVICAL SPINE SURGERY  Feb. 2014   CERVICAL SPINE SURGERY  07/23/15   fusion   COLONOSCOPY     KNEE SURGERY     2x- cartilage   left foot surgery  1985   Left shoulder surgery  1990   Right shoulder surgery  2012   TOTAL KNEE ARTHROPLASTY  January 2011   TOTAL KNEE ARTHROPLASTY  Feb. 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  MD reported spondylosis and did not think surgery was warranted.  Has some tingling in the right shoulder area and in the back of the head.  HE also has LBP.  Reports left shoulder pain and loss of ROM after a vaccination  PERTINENT HISTORY:  Has dizziness and vertigo in the past, 1 level fusion 2019  PAIN:  Are you having pain? Yes: NPRS scale: 3/10 Pain location: neck right side and right shoulder, low back  Pain description: ache, numbness, tingling Aggravating factors: turn head to the right and look up , walkingpain up to 8/10 Relieving factors: Celebrex 1x/day, not move pain can be 0/10, he is currently trying to not take the pain medicine   PRECAUTIONS: None  WEIGHT BEARING RESTRICTIONS: No  FALLS:  Has patient fallen in last 6 months? No  LIVING ENVIRONMENT: Lives with: lives with their family and lives with their spouse Lives in: House/apartment Stairs: No Has following equipment at home: None  OCCUPATION: retired  PLOF: Independent and minimal pain,   PATIENT GOALS: have less pain  NEXT MD VISIT:   OBJECTIVE:   DIAGNOSTIC FINDINGS:  Cervical spondylosis  COGNITION: Overall cognitive status: Within functional limits for tasks assessed  SENSATION: Right back of head and neck, right upper trap  POSTURE: rounded shoulders and forward head, decreased lordosis  PALPATION: Mild tenderness in the upper trap and the neck, tight in the right neck area, tight in the lumbar  area  LUMBAR ROM:  Decreased 50% for flexion, decreased 75% for other motions CERVICAL ROM:   Active ROM A/PROM (deg) eval  Flexion Decreased 25%  Extension Decreased 75%  Right lateral flexion Decreased 75%  Left lateral flexion Decreased 75%  Right rotation Decreased 50%  Left rotation Decreased 50%   (Blank rows = not tested)  UPPER EXTREMITY ROM:  Mild limitations in the shoulders just tight, right shoulder UPPER EXTREMITY MMT:  MMT Right eval Left eval  Shoulder flexion 4- 4  Shoulder extension    Shoulder abduction    Shoulder adduction    Shoulder extension    Shoulder internal rotation 3+ 4-  Shoulder external rotation 3+ 4-  Middle trapezius    Lower trapezius    Elbow flexion    Elbow extension    Wrist flexion    Wrist extension    Wrist ulnar deviation    Wrist radial deviation    Wrist pronation    Wrist supination    Grip strength     (Blank rows = not tested)  CERVICAL SPECIAL TESTS:  Spurling's test: Positive Very tight HS and piriformis ODI= 23/50 = 56% BERG 43/56  TODAY'S TREATMENT:                                                                                                                              DATE:  04/04/24 Seated rows 25# 2x10 Lats 25# 2x10 10# straight arm pulls 2x10 Leg press 40# 2x10 Leg curls 25# 2x10 Leg extension 5# 2x10 AR press 20# 2x10 5# hip extension 5# hip abduction 10# biceps 25# triceps Red tband back to wall ER and horizontal abduction Feet on ball K2C, bridge, isometric abs  04/02/24 Evaluation  PATIENT EDUCATION:  Education details: POC and HEP Person educated: Patient Education method: Programmer, multimedia, Facilities manager, Verbal cues, and Handouts Education comprehension: verbalized understanding  HOME EXERCISE PROGRAM: Access Code: [email or web address] URL: [email or web address] Date: 04/02/24 Prepared by: Stacie Glaze  Exercises - Standing Cervical Retraction  - 1 x daily - 7 x weekly - 2  sets - 10 reps - 3 hold - Standing Cervical Retraction with Sidebending  - 1 x daily - 7 x weekly - 2 sets - 10 reps - 3 hold - Seated Scapular Retraction  - 1 x daily - 7 x weekly - 2 sets - 10 reps - 3 hold - Seated Shoulder Shrugs  - 1 x daily - 7 x weekly - 2 sets - 10 reps - 3 hold  ASSESSMENT:  CLINICAL IMPRESSION: I really did a full good body workout in our gym, went over and needed cues for posture and form, with cues for core activation as well.  He did not have any pain with any of the exercises  Patient is a 78 y.o. male who was seen today for physical therapy evaluation and treatment for neck pain with some radiculopathy, back pain and some balance issues.  .  Had a one level fusion in 2019, MD diagnosis is spondylosis.  Berg balance 43/56, very limited cervical and lumbar ROM, poor core  OBJECTIVE IMPAIRMENTS: decreased ROM, decreased strength, dizziness, increased muscle spasms, impaired flexibility, postural dysfunction, and pain.   REHAB POTENTIAL: Good  CLINICAL DECISION MAKING: Stable/uncomplicated  EVALUATION COMPLEXITY: Low   GOALS: Goals reviewed with patient? Yes  SHORT TERM GOALS: Target date: 05/02/24  Independent with initial HEP Goal status: INITIAL  LONG TERM GOALS: Target date: 07/02/24  Understand posture and body mechanics Goal status: INITIAL  2.  Have a good ergonomic set up for computer and TV use Goal status: INITIAL  3.  Increase cervical ROM 25% Goal status: INITIAL  4.  Decrease pain 25% Goal status: INITIAL  5.  Berg balance test to 48/56 Goal status: INITIAL   PLAN:  PT FREQUENCY: 1-2x/week  PT DURATION: 12 weeks  PLANNED INTERVENTIONS: Therapeutic exercises, Therapeutic activity, Neuromuscular re-education, Balance training, Gait training, Patient/Family education, Self Care, Joint mobilization, Vestibular training, Canalith repositioning, Visual/preceptual remediation/compensation, Dry Needling, Electrical stimulation, Spinal  mobilization, Moist heat, Traction, Ultrasound, and Manual therapy  PLAN FOR NEXT SESSION: he will be travelling a lot and we will need to spread out his visits over a longer period of time, he is leaving in two weeks   Ciela Mahajan W, PT 04/04/2024, 1:57 PM

## 2024-04-22 ENCOUNTER — Ambulatory Visit: Admitting: Physical Therapy

## 2024-04-22 ENCOUNTER — Encounter: Payer: Self-pay | Admitting: Physical Therapy

## 2024-04-22 DIAGNOSIS — R252 Cramp and spasm: Secondary | ICD-10-CM

## 2024-04-22 DIAGNOSIS — M5459 Other low back pain: Secondary | ICD-10-CM

## 2024-04-22 DIAGNOSIS — M62830 Muscle spasm of back: Secondary | ICD-10-CM

## 2024-04-22 DIAGNOSIS — M542 Cervicalgia: Secondary | ICD-10-CM | POA: Diagnosis not present

## 2024-04-22 NOTE — Therapy (Signed)
 OUTPATIENT PHYSICAL THERAPY CERVICAL/LUMBAR TREATMENT   Patient Name: George Freeman MRN: [id number] DOB:1946/06/18, 78 y.o., male Today's Date: 04/22/2024  END OF SESSION:  PT End of Session - 04/22/24 1440     Visit Number 3    Number of Visits 13    Date for PT Re-Evaluation 07/02/24    Authorization Type [employer]    PT Start Time 1443    PT Stop Time 1530    PT Time Calculation (min) 47 min    Activity Tolerance Patient tolerated treatment well    Behavior During Therapy WFL for tasks assessed/performed             Past Medical History:  Diagnosis Date   Anxiety    Arthritis    BPV (benign positional vertigo)    Depression    GERD (gastroesophageal reflux disease)    Neuromuscular disorder (HCC)    Panic disorder    PONV (postoperative nausea and vomiting)    chills and anxiety after surgery   Past Surgical History:  Procedure Laterality Date   ANAL FISSURE REPAIR  1993   APPENDECTOMY  1969   BACK SURGERY  2009   Stenosis, sciatica    BACK SURGERY  2011   Stenosis, sciatica   BACK SURGERY  December 2014   CERVICAL SPINE SURGERY  Feb. 2014   CERVICAL SPINE SURGERY  07/23/15   fusion   COLONOSCOPY     KNEE SURGERY     2x- cartilage   left foot surgery  1985   Left shoulder surgery  1990   Right shoulder surgery  2012   TOTAL KNEE ARTHROPLASTY  January 2011   TOTAL KNEE ARTHROPLASTY  Feb. 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  Reports that he over did it with trying to work out at the gym and then had to run a little bit due to some rain.  Pain a 5-6/10 and is limping and looks very stiff  Patient reports that he has had some neck pain for over a year.  MD reported spondylosis and did not think surgery was warranted.  Has some tingling in the right shoulder area and in the back of the head.  HE also has LBP.  Reports left shoulder pain and loss of ROM after a vaccination  PERTINENT HISTORY:  Has dizziness and vertigo in the past, 1 level fusion 2019  PAIN:  Are you having pain? Yes: NPRS scale: 3/10 Pain location: neck right side and right shoulder, low back  Pain description: ache, numbness, tingling Aggravating factors: turn head to the right and look up , walkingpain up to 8/10 Relieving factors: Celebrex  1x/day, not move pain can be 0/10, he is currently trying to not take the pain medicine   PRECAUTIONS: None  WEIGHT BEARING RESTRICTIONS: No  FALLS:  Has patient fallen in last 6 months? No  LIVING ENVIRONMENT: Lives with: lives with their family and lives with their spouse Lives in: House/apartment Stairs: No Has following equipment at home: None  OCCUPATION: retired  PLOF: Independent and minimal pain,   PATIENT GOALS: have less pain  NEXT MD VISIT:   OBJECTIVE:   DIAGNOSTIC FINDINGS:  Cervical spondylosis  COGNITION: Overall cognitive status: Within functional limits for tasks assessed  SENSATION: Right back of head and neck, right upper  trap  POSTURE: rounded shoulders and forward head, decreased lordosis  PALPATION: Mild tenderness in the upper trap and the neck, tight in the right neck area, tight in the lumbar area  LUMBAR ROM:  Decreased 50% for flexion, decreased 75% for other motions CERVICAL ROM:   Active ROM A/PROM (deg) eval  Flexion Decreased 25%  Extension Decreased 75%  Right lateral flexion Decreased 75%  Left lateral flexion Decreased 75%  Right rotation Decreased 50%  Left rotation Decreased 50%   (Blank rows = not tested)  UPPER EXTREMITY ROM:  Mild limitations in the shoulders just tight, right shoulder UPPER EXTREMITY MMT:  MMT Right eval Left eval  Shoulder flexion 4- 4  Shoulder extension    Shoulder abduction    Shoulder adduction    Shoulder extension    Shoulder internal rotation 3+ 4-  Shoulder external rotation 3+ 4-  Middle trapezius    Lower trapezius    Elbow flexion    Elbow extension    Wrist flexion    Wrist extension    Wrist ulnar deviation    Wrist radial deviation    Wrist pronation    Wrist supination    Grip strength     (Blank rows = not tested)  CERVICAL SPECIAL TESTS:  Spurling's test: Positive Very tight HS and piriformis ODI= 23/50 = 56% BERG 43/56  TODAY'S TREATMENT:  DATE:  04/22/24 Bike level 3 x 5 minutes 25# lats 25# rows 20# AR press Passive stretch LE's STM to the right HS  04/04/24 Seated rows 25# 2x10 Lats 25# 2x10 10# straight arm pulls 2x10 Leg press 40# 2x10 Leg curls 25# 2x10 Leg extension 5# 2x10 AR press 20# 2x10 5# hip extension 5# hip abduction 10# biceps 25# triceps Red tband back to wall ER and horizontal abduction Feet on ball K2C, bridge, isometric abs  04/02/24 Evaluation  PATIENT EDUCATION:  Education details: POC and HEP Person educated: Patient Education method: Programmer, multimedia,  Facilities manager, Verbal cues, and Handouts Education comprehension: verbalized understanding  HOME EXERCISE PROGRAM: Access Code: [email or web address] URL: [email or web address] Date: 04/02/24 Prepared by: Cherylene Corrente  Exercises - Standing Cervical Retraction  - 1 x daily - 7 x weekly - 2 sets - 10 reps - 3 hold - Standing Cervical Retraction with Sidebending  - 1 x daily - 7 x weekly - 2 sets - 10 reps - 3 hold - Seated Scapular Retraction  - 1 x daily - 7 x weekly - 2 sets - 10 reps - 3 hold - Seated Shoulder Shrugs  - 1 x daily - 7 x weekly - 2 sets - 10 reps - 3 hold  ASSESSMENT:  CLINICAL IMPRESSION: Patient with a flare up of the left HS, he reports over did it with a workout and then had to run because it started raining.  He also was on a flight to [place]  and this caused him to stiffen up, he is obviously limping on th left, I worry that this could cause his LBP to worsen  Patient is a 78 y.o. male who was seen today for physical therapy evaluation and treatment for neck pain with some radiculopathy, back pain and some balance issues.  .  Had a one level fusion in 2019, MD diagnosis is spondylosis.  Berg balance 43/56, very limited cervical and lumbar ROM, poor core  OBJECTIVE IMPAIRMENTS: decreased ROM, decreased strength, dizziness, increased muscle spasms, impaired flexibility, postural dysfunction, and pain.   REHAB POTENTIAL: Good  CLINICAL DECISION MAKING: Stable/uncomplicated  EVALUATION COMPLEXITY: Low   GOALS: Goals reviewed with patient? Yes  SHORT TERM GOALS: Target date: 05/02/24  Independent with initial HEP Goal status: met 04/22/24  LONG TERM GOALS: Target date: 07/02/24  Understand posture and body mechanics Goal status: INITIAL  2.  Have a good ergonomic set up for computer and TV use Goal status: INITIAL  3.  Increase cervical ROM 25% Goal status: INITIAL  4.  Decrease pain 25% Goal status: INITIAL  5.  Berg balance test to 48/56 Goal  status: INITIAL   PLAN:  PT FREQUENCY: 1-2x/week  PT DURATION: 12 weeks  PLANNED INTERVENTIONS: Therapeutic exercises, Therapeutic activity, Neuromuscular re-education, Balance training, Gait training, Patient/Family education, Self Care, Joint mobilization, Vestibular training, Canalith repositioning, Visual/preceptual remediation/compensation, Dry Needling, Electrical stimulation, Spinal mobilization, Moist heat, Traction, Ultrasound, and Manual therapy  PLAN FOR NEXT SESSION: he will be travelling a lot and we will need to spread out his visits over a longer period of time, he is leaving in two weeks may need to bump up visits so he is doing well when he travels  [place], PT 04/22/2024, 2:47 PM

## 2024-04-29 ENCOUNTER — Ambulatory Visit: Attending: Family Medicine | Admitting: Physical Therapy

## 2024-04-29 ENCOUNTER — Encounter: Payer: Self-pay | Admitting: Physical Therapy

## 2024-04-29 DIAGNOSIS — M542 Cervicalgia: Secondary | ICD-10-CM | POA: Insufficient documentation

## 2024-04-29 DIAGNOSIS — R252 Cramp and spasm: Secondary | ICD-10-CM | POA: Diagnosis present

## 2024-04-29 DIAGNOSIS — M6281 Muscle weakness (generalized): Secondary | ICD-10-CM | POA: Insufficient documentation

## 2024-04-29 DIAGNOSIS — M62830 Muscle spasm of back: Secondary | ICD-10-CM | POA: Diagnosis present

## 2024-04-29 DIAGNOSIS — M5459 Other low back pain: Secondary | ICD-10-CM | POA: Diagnosis present

## 2024-04-29 NOTE — Therapy (Signed)
 OUTPATIENT PHYSICAL THERAPY CERVICAL/LUMBAR TREATMENT   Patient Name: LAURA BUSTO MRN: [id number] DOB:1946/04/05, 78 y.o., male Today's Date: 04/29/2024  END OF SESSION:  PT End of Session - 04/29/24 1527     Visit Number 4    Number of Visits 13    Date for PT Re-Evaluation 07/02/24    Authorization Type [employer]    PT Start Time 1527    PT Stop Time 1613    PT Time Calculation (min) 46 min    Activity Tolerance Patient tolerated treatment well    Behavior During Therapy WFL for tasks assessed/performed             Past Medical History:  Diagnosis Date   Anxiety    Arthritis    BPV (benign positional vertigo)    Depression    GERD (gastroesophageal reflux disease)    Neuromuscular disorder (HCC)    Panic disorder    PONV (postoperative nausea and vomiting)    chills and anxiety after surgery   Past Surgical History:  Procedure Laterality Date   ANAL FISSURE REPAIR  1993   APPENDECTOMY  1969   BACK SURGERY  2009   Stenosis, sciatica    BACK SURGERY  2011   Stenosis, sciatica   BACK SURGERY  December 2014   CERVICAL SPINE SURGERY  Feb. 2014   CERVICAL SPINE SURGERY  07/23/15   fusion   COLONOSCOPY     KNEE SURGERY     2x- cartilage   left foot surgery  1985   Left shoulder surgery  1990   Right shoulder surgery  2012   TOTAL KNEE ARTHROPLASTY  January 2011   TOTAL KNEE ARTHROPLASTY  Feb. 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   Patient reports that he has had some neck pain for over a year.  MD reported spondylosis and did not think surgery was warranted.  Has some tingling in the right shoulder area and in the back of the head.  HE also has LBP.  Reports left shoulder pain and loss of ROM after a vaccination  PERTINENT HISTORY:  Has dizziness and vertigo in the past, 1 level fusion 2019  PAIN:  Are you having pain? Yes: NPRS scale: 3/10 Pain location: neck right side and right shoulder, low back  Pain description: ache, numbness, tingling Aggravating factors: turn head to the right and look up , walkingpain up to 8/10 Relieving factors: Celebrex  1x/day, not move pain can be 0/10, he is currently trying to not take the pain medicine   PRECAUTIONS: None  WEIGHT BEARING RESTRICTIONS: No  FALLS:  Has patient fallen in last 6 months? No  LIVING ENVIRONMENT: Lives with: lives with their family and lives with their spouse Lives in: House/apartment Stairs: No Has following equipment at home: None  OCCUPATION: retired  PLOF: Independent and minimal pain,   PATIENT GOALS: have less pain  NEXT MD VISIT:   OBJECTIVE:   DIAGNOSTIC FINDINGS:  Cervical spondylosis  COGNITION: Overall cognitive status: Within functional limits for tasks assessed  SENSATION: Right back of head and neck, right upper trap  POSTURE: rounded shoulders and forward head, decreased lordosis  PALPATION: Mild tenderness in the upper trap and the neck, tight in  the right neck area, tight in the lumbar area  LUMBAR ROM:  Decreased 50% for flexion, decreased 75% for other motions CERVICAL ROM:   Active ROM A/PROM (deg) eval  Flexion Decreased 25%  Extension Decreased 75%  Right lateral flexion Decreased 75%  Left lateral flexion Decreased 75%  Right rotation Decreased 50%  Left rotation Decreased 50%   (Blank rows = not tested)  UPPER EXTREMITY ROM:  Mild limitations in the shoulders just tight, right shoulder UPPER EXTREMITY MMT:  MMT Right eval Left eval  Shoulder flexion 4- 4  Shoulder extension    Shoulder abduction    Shoulder adduction    Shoulder extension    Shoulder internal rotation 3+ 4-  Shoulder external rotation 3+ 4-  Middle trapezius    Lower trapezius    Elbow flexion    Elbow extension    Wrist flexion    Wrist extension    Wrist ulnar deviation    Wrist radial deviation    Wrist pronation    Wrist supination    Grip strength     (Blank rows = not tested)  CERVICAL SPECIAL TESTS:  Spurling's test: Positive Very tight HS and piriformis ODI= 23/50 = 56% BERG 43/56  TODAY'S TREATMENT:  DATE:  04/29/24 Bike level 5 x 6 minutes 25# lats 25# row 20# AR press 10# straight arm pulls Gait outside around the back building he started having some left HS pain up to 3/10 coming up the hill Passive stretch STM to the HS  04/22/24 Bike level 3 x 5 minutes 25# lats 25# rows 20# AR press Passive stretch LE's STM to the right HS  04/04/24 Seated rows 25# 2x10 Lats 25# 2x10 10# straight arm pulls 2x10 Leg press 40# 2x10 Leg curls 25# 2x10 Leg extension 5# 2x10 AR press 20# 2x10 5# hip extension 5# hip abduction 10# biceps 25# triceps Red tband back to wall ER and horizontal abduction Feet on ball K2C, bridge, isometric abs  04/02/24 Evaluation  PATIENT EDUCATION:  Education  details: POC and HEP Person educated: Patient Education method: Programmer, multimedia, Facilities manager, Verbal cues, and Handouts Education comprehension: verbalized understanding  HOME EXERCISE PROGRAM: Access Code: [email or web address] URL: [email or web address] Date: 04/02/24 Prepared by: Cherylene Corrente  Exercises - Standing Cervical Retraction  - 1 x daily - 7 x weekly - 2 sets - 10 reps - 3 hold - Standing Cervical Retraction with Sidebending  - 1 x daily - 7 x weekly - 2 sets - 10 reps - 3 hold - Seated Scapular Retraction  - 1 x daily - 7 x weekly - 2 sets - 10 reps - 3 hold - Seated Shoulder Shrugs  - 1 x daily - 7 x weekly - 2 sets - 10 reps - 3 hold  ASSESSMENT:  CLINICAL IMPRESSION: Patient reports that he feels that the HS is feeling a little better, he is concerned as he will be traveling in the near future and really want to not have any issues as he is unsure of how much walking he may have to do.  Cues needed for exercises for form and posture  Patient is a 78 y.o. male who was seen today for physical therapy evaluation and treatment for neck pain with some radiculopathy, back pain and some balance issues.  .  Had a one level fusion in 2019, MD diagnosis is spondylosis.  Berg balance 43/56, very limited cervical and lumbar ROM, poor core  OBJECTIVE IMPAIRMENTS: decreased ROM, decreased strength, dizziness, increased muscle spasms, impaired flexibility, postural dysfunction, and pain.   REHAB POTENTIAL: Good  CLINICAL DECISION MAKING: Stable/uncomplicated  EVALUATION COMPLEXITY: Low   GOALS: Goals reviewed with patient? Yes  SHORT TERM GOALS: Target date: 05/02/24  Independent with initial HEP Goal status: met 04/22/24  LONG TERM GOALS: Target date: 07/02/24  Understand posture and body mechanics Goal status: progressing 04/29/24  2.  Have a good ergonomic set up for computer and TV use Goal status: INITIAL  3.  Increase cervical ROM 25% Goal status: INITIAL  4.   Decrease pain 25% Goal status: ongoing 04/29/24  5.  Berg balance test to 48/56 Goal status: INITIAL   PLAN:  PT FREQUENCY: 1-2x/week  PT DURATION: 12 weeks  PLANNED INTERVENTIONS: Therapeutic exercises, Therapeutic activity, Neuromuscular re-education, Balance training, Gait training, Patient/Family education, Self Care, Joint mobilization, Vestibular training, Canalith repositioning, Visual/preceptual remediation/compensation, Dry Needling, Electrical stimulation, Spinal mobilization, Moist heat, Traction, Ultrasound, and Manual therapy  PLAN FOR NEXT SESSION: focus on the HS and the low back and get him ready for his trip  Hollis Lurie, PT 04/29/2024, 3:28 PM

## 2024-05-01 NOTE — Therapy (Signed)
 OUTPATIENT PHYSICAL THERAPY MALE PELVIC EVALUATION   Patient Name: George Freeman MRN: [id number] DOB:1946-03-03, 78 y.o., male Today's Date: 05/02/2024  END OF SESSION:  PT End of Session - 05/02/24 1730     Visit Number 5    Date for PT Re-Evaluation 08/02/24    Authorization Type [employer]    PT Start Time 1400    PT Stop Time 1443    PT Time Calculation (min) 43 min    Activity Tolerance Patient tolerated treatment well    Behavior During Therapy WFL for tasks assessed/performed             Past Medical History:  Diagnosis Date   Anxiety    Arthritis    BPV (benign positional vertigo)    Depression    GERD (gastroesophageal reflux disease)    Neuromuscular disorder (HCC)    Panic disorder    PONV (postoperative nausea and vomiting)    chills and anxiety after surgery   Past Surgical History:  Procedure Laterality Date   ANAL FISSURE REPAIR  1993   APPENDECTOMY  1969   BACK SURGERY  2009   Stenosis, sciatica    BACK SURGERY  2011   Stenosis, sciatica   BACK SURGERY  December 2014   CERVICAL SPINE SURGERY  Feb. 2014   CERVICAL SPINE SURGERY  07/23/15   fusion   COLONOSCOPY     KNEE SURGERY     2x- cartilage   left foot surgery  1985   Left shoulder surgery  1990   Right shoulder surgery  2012   TOTAL KNEE ARTHROPLASTY  January 2011   TOTAL KNEE ARTHROPLASTY  Feb. 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 Was taking imodium for symptom relief, it was working pretty well. Pt not sure if that would be a good long term solution. Tried squatty potty, helped some, still some leakage. Pt tried Miralax.  Leakage started a couple of years ago, it was not so bad, used some pads, maybe came from diet. He is not quite sure, some sweets maybe. They travel a lot, he eats ice cream and doughnuts.  "Spotting" 2-3 times/ day, diarrhea, sometimes, starts to chafe Tries to stay between 190- 195 lbs   Fluid intake: diet sodas a lot-3-4, water  PAIN:  Are you having pain? No- no stomach, rectum  PRECAUTIONS: None  RED FLAGS: None   WEIGHT BEARING RESTRICTIONS: No  FALLS:  Has patient fallen in last 6 months? 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  Not doing anything now Fully empty rectum: Yes: squatty potty helps Leakage:  Yes: 2-3 times/ day Pads: Yes:   Fiber supplement: No  URINATION: had a urolift 3 years ago Pain with urination: Noafter urolift Fully empty bladder: Yes:   Stream: Strong Urgency: No Frequency: no- sleeps all night Leakage: none Pads: No  INTERCOURSE: Pain with intercourse: none- frequency less than used to    OBJECTIVE:  Note: Objective measures were completed at Evaluation unless otherwise noted.    PATIENT SURVEYS:    PFIQ-7 10, CRAIQ- 7 10  COGNITION: Overall cognitive status: Within functional limits for tasks assessed     SENSATION: Light touch: Appears intact Proprioception: Appears intact  MUSCLE LENGTH: Hamstrings: tight bilateral 70D    LUMBAR SPECIAL TESTS:  Slump test: Negative    POSTURE: rounded shoulders, forward head, decreased lumbar lordosis, and flexed trunk   PELVIC ALIGNMENT: even  LUMBARAROM/PROM: stiffness throughout- seeing PT at [facility]   LOWER EXTREMITY AROM/PROM: limited throughout   LOWER EXTREMITY MMT: 4/5 throughout   PALPATION:               External Perineal Exam within functional limitations               Internal Pelvic Floor mild weakness and decreased coordination with contraction EAS, IAS, difficulty with bulging/ lengthening, took a lot of effort Patient confirms identification and approves PT to assess internal pelvic floor and treatment Yes  PELVIC MMT:   MMT eval  Internal Anal Sphincter 3/5  External Anal Sphincter 3/5  Puborectalis   Diastasis Recti 1-2 fingers at and above umbilicus  (Blank rows = not tested)  TONE: low  TODAY'S TREATMENT:  DATE: 05/02/24  EVAL see below   PATIENT EDUCATION:  Education details: relevant anatomy, exam findings, expectation of PF, HEP Person educated: Patient Education method: Explanation, Demonstration, Tactile cues, Verbal cues,  and Handouts Education comprehension: verbalized understanding, returned demonstration, verbal cues required, tactile cues required, and needs further education  HOME EXERCISE PROGRAM: Access Code: [email or web address] URL: [email or web address] Date: 05/02/2024 Prepared by: Jameson Mcburney Anai Lipson  Exercises - Supine Diaphragmatic Breathing  - 1 x daily - 7 x weekly - 3 sets - 10 reps - Supine Bridge with Pelvic Floor Contraction  - 1 x daily - 7 x weekly - 3 sets - 10 reps - Seated Abdominal Press into [employer]  - 1 x daily - 7 x weekly - 3 sets - 10 reps - Rowing with Pelvic Floor Contraction  - 1 x daily - 7 x weekly - 3 sets - 10 reps - Seated Isometric Hip Adduction with Pelvic Floor Contraction  - 1 x daily - 7 x weekly - 3 sets - 10 reps - Horizontal abd with TB with TRA breath  - 1 x daily - 7 x weekly - 3 sets - 10 reps  ASSESSMENT:  CLINICAL IMPRESSION: Patient is a 78 y.o. M who was seen today for physical therapy evaluation and treatment for constipation. Pt with difficulty contracting and relaxing anal sphincters, decreased strength and coordination, increased effort needed to lengthen pelvic floor. Pt complained more of diarrhea and was agreeable to trial of fiber ( metamucil) to bulk up stool.Discussed use of Desitin cream to decrease chafing and cont use of squatty potty. Pt drinks several diet sodas a day, will try a 2 week water only reset to see if this helps with diarrhea. He reported that sugar can irritate his gut. He is stiff throughout his trunk, has diastasis rectus and umbilical hernia and will benefit from pelvic PT to reduce fecal accidents, optimize stool consistency to [facility] stool scale 3-4 and strengthen and coordinate his pelvic floor with his core and breathing.   OBJECTIVE IMPAIRMENTS: decreased coordination, decreased knowledge of condition, decreased ROM, decreased strength, increased fascial restrictions, increased muscle spasms, impaired tone, and pain.    ACTIVITY LIMITATIONS: continence and toileting  PARTICIPATION LIMITATIONS: driving and community activity  PERSONAL FACTORS: Time since onset of injury/illness/exacerbation are also affecting patient's functional outcome.   REHAB POTENTIAL: Good  CLINICAL DECISION MAKING: Evolving/moderate complexity  EVALUATION COMPLEXITY: Moderate   GOALS: Goals reviewed with patient? Yes  SHORT TERM GOALS: Target date: 05/02/24   Pt will be independent with HEP.   Baseline: Goal status: INITIAL  2.  Pt will be independent with use of squatty potty, relaxed toileting mechanics, and improved bowel movement techniques in order to increase ease of bowel movements and complete evacuation.   Baseline:  Goal status: INITIAL  LONG TERM GOALS: Target date: 07/25/2024    Pt will be independent with advanced HEP.   Baseline:  Goal status: INITIAL  2.  Pt will have Bristol stool scale type 3-4 to reduce risk of fecal smearing Baseline:  Goal status: INITIAL  3.  Pt will have at least 1 bowel movement/ day Baseline:  Goal status: INITIAL  4.  Pt will have 0 fecal incontinence accidents/ week Baseline:  Goal status: INITIAL   PLAN:  PT FREQUENCY: 1-2x/week  PT DURATION: 12 weeks  PLANNED INTERVENTIONS: 97110-Therapeutic exercises, 97530- Therapeutic activity, [id number]- Neuromuscular re-education, 97535- Self Care, [id number]- Manual therapy, [id number]- Electrical stimulation (manual), Taping, Dry Needling, Joint mobilization, Joint manipulation, Spinal manipulation,  Spinal mobilization, Scar mobilization, Cryotherapy, Moist heat, and Biofeedback  PLAN FOR NEXT SESSION: food/ bowel diary, abdominal massage, review exercises for back, add pelvic floor awareness exercises for strengthening/ coordination, abdominal strengthening   Lineth Thielke, PT 05/02/2024, 5:31 PM

## 2024-05-02 ENCOUNTER — Other Ambulatory Visit: Payer: Self-pay

## 2024-05-02 ENCOUNTER — Ambulatory Visit: Admitting: Physical Therapy

## 2024-05-02 ENCOUNTER — Encounter: Payer: Self-pay | Admitting: Physical Therapy

## 2024-05-02 DIAGNOSIS — M62830 Muscle spasm of back: Secondary | ICD-10-CM

## 2024-05-02 DIAGNOSIS — R278 Other lack of coordination: Secondary | ICD-10-CM

## 2024-05-02 DIAGNOSIS — M6281 Muscle weakness (generalized): Secondary | ICD-10-CM | POA: Diagnosis present

## 2024-05-02 DIAGNOSIS — M5459 Other low back pain: Secondary | ICD-10-CM | POA: Diagnosis present

## 2024-05-02 DIAGNOSIS — R252 Cramp and spasm: Secondary | ICD-10-CM

## 2024-05-06 ENCOUNTER — Ambulatory Visit: Admitting: Physical Therapy

## 2024-05-06 ENCOUNTER — Encounter: Payer: Self-pay | Admitting: Physical Therapy

## 2024-05-06 DIAGNOSIS — R252 Cramp and spasm: Secondary | ICD-10-CM

## 2024-05-06 DIAGNOSIS — M5459 Other low back pain: Secondary | ICD-10-CM

## 2024-05-06 DIAGNOSIS — M542 Cervicalgia: Secondary | ICD-10-CM | POA: Diagnosis not present

## 2024-05-06 DIAGNOSIS — M6281 Muscle weakness (generalized): Secondary | ICD-10-CM

## 2024-05-06 DIAGNOSIS — M62830 Muscle spasm of back: Secondary | ICD-10-CM

## 2024-05-06 NOTE — Therapy (Signed)
 OUTPATIENT PHYSICAL THERAPY CERVICAL/LUMBAR TREATMENT   Patient Name: George Freeman MRN: [id number] DOB:Apr 11, 1946, 78 y.o., male Today's Date: 05/06/2024  END OF SESSION:  PT End of Session - 05/06/24 1349     Visit Number 6    Number of Visits 13    Date for PT Re-Evaluation 08/02/24    Authorization Type [employer]    PT Start Time 1450    PT Stop Time 1536    PT Time Calculation (min) 46 min    Activity Tolerance Patient tolerated treatment well    Behavior During Therapy WFL for tasks assessed/performed             Past Medical History:  Diagnosis Date   Anxiety    Arthritis    BPV (benign positional vertigo)    Depression    GERD (gastroesophageal reflux disease)    Neuromuscular disorder (HCC)    Panic disorder    PONV (postoperative nausea and vomiting)    chills and anxiety after surgery   Past Surgical History:  Procedure Laterality Date   ANAL FISSURE REPAIR  1993   APPENDECTOMY  1969   BACK SURGERY  2009   Stenosis, sciatica    BACK SURGERY  2011   Stenosis, sciatica   BACK SURGERY  December 2014   CERVICAL SPINE SURGERY  Feb. 2014   CERVICAL SPINE SURGERY  07/23/15   fusion   COLONOSCOPY     KNEE SURGERY     2x- cartilage   left foot surgery  1985   Left shoulder surgery  1990   Right shoulder surgery  2012   TOTAL KNEE ARTHROPLASTY  January 2011   TOTAL KNEE ARTHROPLASTY  Feb. 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  MD reported spondylosis and did not think surgery was warranted.  Has some tingling in the right shoulder area and in the back of the head.  HE also has LBP.  Reports left shoulder pain and loss of ROM after a vaccination  PERTINENT HISTORY:  Has dizziness and vertigo in the past, 1 level fusion 2019  PAIN:  Are you having pain? Yes: NPRS scale: 3/10 Pain location: neck right side and right shoulder, low back  Pain description: ache, numbness, tingling Aggravating factors: turn head to the right and look up , walkingpain up to 8/10 Relieving factors: Celebrex  1x/day, not move pain can be 0/10, he is currently trying to not take the pain medicine   PRECAUTIONS: None  WEIGHT BEARING RESTRICTIONS: No  FALLS:  Has patient fallen in last 6 months? No  LIVING ENVIRONMENT: Lives with: lives with their family and lives with their spouse Lives in: House/apartment Stairs: No Has following equipment at home: None  OCCUPATION: retired  PLOF: Independent and minimal pain,   PATIENT GOALS: have less pain  NEXT MD VISIT:   OBJECTIVE:   DIAGNOSTIC FINDINGS:  Cervical spondylosis  COGNITION: Overall cognitive status: Within functional limits for tasks assessed  SENSATION: Right back of head and neck, right upper trap  POSTURE: rounded shoulders and forward head, decreased lordosis  PALPATION: Mild tenderness  in the upper trap and the neck, tight in the right neck area, tight in the lumbar area  LUMBAR ROM:  Decreased 50% for flexion, decreased 75% for other motions CERVICAL ROM:   Active ROM A/PROM (deg) eval  Flexion Decreased 25%  Extension Decreased 75%  Right lateral flexion Decreased 75%  Left lateral flexion Decreased 75%  Right rotation Decreased 50%  Left rotation Decreased 50%   (Blank rows = not tested)  UPPER EXTREMITY ROM:  Mild limitations in the shoulders just tight, right shoulder UPPER EXTREMITY MMT:  MMT Right eval Left eval  Shoulder flexion 4- 4  Shoulder extension    Shoulder abduction    Shoulder adduction    Shoulder extension    Shoulder internal rotation 3+ 4-  Shoulder external rotation 3+ 4-  Middle trapezius    Lower trapezius    Elbow flexion    Elbow extension    Wrist flexion    Wrist extension    Wrist ulnar deviation    Wrist radial deviation    Wrist pronation    Wrist supination    Grip strength     (Blank rows = not tested)  CERVICAL SPECIAL TESTS:  Spurling's test: Positive Very tight HS and piriformis ODI= 23/50 = 56% BERG 43/56  TODAY'S TREATMENT:  DATE:  05/06/24 Nustep level 5 x 6 minutes 15# HS curls 2x10 5# leg extension 25# rows 25# lats 20# leg press Feet on ball K2C, rotation, bridge, isometric abs Passive stretch LE's STM with the Tgun to the left HS  04/29/24 Bike level 5 x 6 minutes 25# lats 25# row 20# AR press 10# straight arm pulls Gait outside around the back building he started having some left HS pain up to 3/10 coming up the hill Passive stretch STM to the HS  04/22/24 Bike level 3 x 5 minutes 25# lats 25# rows 20# AR press Passive stretch LE's STM to the right HS  04/04/24 Seated rows 25# 2x10 Lats 25# 2x10 10# straight arm pulls 2x10 Leg press 40# 2x10 Leg curls  25# 2x10 Leg extension 5# 2x10 AR press 20# 2x10 5# hip extension 5# hip abduction 10# biceps 25# triceps Red tband back to wall ER and horizontal abduction Feet on ball K2C, bridge, isometric abs  04/02/24 Evaluation  PATIENT EDUCATION:  Education details: POC and HEP Person educated: Patient Education method: Programmer, multimedia, Facilities manager, Verbal cues, and Handouts Education comprehension: verbalized understanding  HOME EXERCISE PROGRAM: Access Code: [email or web address] URL: [email or web address] Date: 04/02/24 Prepared by: Cherylene Corrente  Exercises - Standing Cervical Retraction  - 1 x daily - 7 x weekly - 2 sets - 10 reps - 3 hold - Standing Cervical Retraction with Sidebending  - 1 x daily - 7 x weekly - 2 sets - 10 reps - 3 hold - Seated Scapular Retraction  - 1 x daily - 7 x weekly - 2 sets - 10 reps - 3 hold - Seated Shoulder Shrugs  - 1 x daily - 7 x weekly - 2 sets - 10 reps - 3 hold  ASSESSMENT:  CLINICAL IMPRESSION: Continues to feel better, still some soreness reports was in car for 3.5 hours today and is a little sore, reports that he did walk 2 miles the other day and he did feel some pain  Patient is a 78 y.o. male who was seen today for physical therapy evaluation and treatment for neck pain with some radiculopathy, back pain and some balance issues.  .  Had a one level fusion in 2019, MD diagnosis is spondylosis.  Berg balance 43/56, very limited cervical and lumbar ROM, poor core  OBJECTIVE IMPAIRMENTS: decreased ROM, decreased strength, dizziness, increased muscle spasms, impaired flexibility, postural dysfunction, and pain.   REHAB POTENTIAL: Good  CLINICAL DECISION MAKING: Stable/uncomplicated  EVALUATION COMPLEXITY: Low   GOALS: Goals reviewed with patient? Yes  SHORT TERM GOALS: Target date: 05/02/24  Independent with initial HEP Goal status: met 04/22/24  LONG TERM GOALS: Target date: 07/02/24  Understand posture and body mechanics Goal  status: progressing 04/29/24  2.  Have a good ergonomic set up for computer and TV use Goal status: INITIAL  3.  Increase cervical ROM 25% Goal status: INITIAL  4.  Decrease pain 25% Goal status: ongoing 04/29/24  5.  Berg balance test to 48/56 Goal status: INITIAL   PLAN:  PT FREQUENCY: 1-2x/week  PT DURATION: 12 weeks  PLANNED INTERVENTIONS: Therapeutic exercises, Therapeutic activity, Neuromuscular re-education, Balance training, Gait training, Patient/Family education, Self Care, Joint mobilization, Vestibular training, Canalith repositioning, Visual/preceptual remediation/compensation, Dry Needling, Electrical stimulation, Spinal mobilization, Moist heat, Traction, Ultrasound, and Manual therapy  PLAN FOR NEXT SESSION: focus on the HS and the low back and get him ready for his trip  Hollis Lurie, PT 05/06/2024, 1:50 PM

## 2024-05-07 ENCOUNTER — Ambulatory Visit: Admitting: Physical Therapy

## 2024-05-07 ENCOUNTER — Encounter: Payer: Self-pay | Admitting: Physical Therapy

## 2024-05-07 DIAGNOSIS — M5459 Other low back pain: Secondary | ICD-10-CM

## 2024-05-07 DIAGNOSIS — M6281 Muscle weakness (generalized): Secondary | ICD-10-CM

## 2024-05-07 DIAGNOSIS — M542 Cervicalgia: Secondary | ICD-10-CM | POA: Diagnosis not present

## 2024-05-07 DIAGNOSIS — R252 Cramp and spasm: Secondary | ICD-10-CM

## 2024-05-07 DIAGNOSIS — M62830 Muscle spasm of back: Secondary | ICD-10-CM

## 2024-05-07 NOTE — Therapy (Signed)
 OUTPATIENT PHYSICAL THERAPY CERVICAL/LUMBAR TREATMENT   Patient Name: George Freeman MRN: [id number] DOB:1946-03-29, 78 y.o., male Today's Date: 05/07/2024  END OF SESSION:  PT End of Session - 05/07/24 1656     Visit Number 7    Number of Visits 13    Date for PT Re-Evaluation 08/02/24    Authorization Type [employer]    PT Start Time 1650    PT Stop Time 1740    PT Time Calculation (min) 50 min    Activity Tolerance Patient tolerated treatment well    Behavior During Therapy WFL for tasks assessed/performed             Past Medical History:  Diagnosis Date   Anxiety    Arthritis    BPV (benign positional vertigo)    Depression    GERD (gastroesophageal reflux disease)    Neuromuscular disorder (HCC)    Panic disorder    PONV (postoperative nausea and vomiting)    chills and anxiety after surgery   Past Surgical History:  Procedure Laterality Date   ANAL FISSURE REPAIR  1993   APPENDECTOMY  1969   BACK SURGERY  2009   Stenosis, sciatica    BACK SURGERY  2011   Stenosis, sciatica   BACK SURGERY  December 2014   CERVICAL SPINE SURGERY  Feb. 2014   CERVICAL SPINE SURGERY  07/23/15   fusion   COLONOSCOPY     KNEE SURGERY     2x- cartilage   left foot surgery  1985   Left shoulder surgery  1990   Right shoulder surgery  2012   TOTAL KNEE ARTHROPLASTY  January 2011   TOTAL KNEE ARTHROPLASTY  Feb. 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  MD reported spondylosis and did not think surgery was warranted.  Has some tingling in the right shoulder area and in the back of the head.  HE also has LBP.  Reports left shoulder pain and loss of ROM after a vaccination  PERTINENT HISTORY:  Has dizziness and vertigo in the past, 1 level fusion 2019  PAIN:  Are you having pain? Yes: NPRS scale: 3/10 Pain location: neck right side and right shoulder, low back  Pain description: ache, numbness, tingling Aggravating factors: turn head to the right and look up , walkingpain up to 8/10 Relieving factors: Celebrex  1x/day, not move pain can be 0/10, he is currently trying to not take the pain medicine   PRECAUTIONS: None  WEIGHT BEARING RESTRICTIONS: No  FALLS:  Has patient fallen in last 6 months? No  LIVING ENVIRONMENT: Lives with: lives with their family and lives with their spouse Lives in: House/apartment Stairs: No Has following equipment at home: None  OCCUPATION: retired  PLOF: Independent and minimal pain,   PATIENT GOALS: have less pain  NEXT MD VISIT:   OBJECTIVE:   DIAGNOSTIC FINDINGS:  Cervical spondylosis  COGNITION: Overall cognitive status: Within functional limits for tasks assessed  SENSATION: Right back of head and neck, right upper trap  POSTURE: rounded shoulders and forward head, decreased lordosis  PALPATION: Mild tenderness in the upper trap and the neck,  tight in the right neck area, tight in the lumbar area  LUMBAR ROM:  Decreased 50% for flexion, decreased 75% for other motions CERVICAL ROM:   Active ROM A/PROM (deg) eval  Flexion Decreased 25%  Extension Decreased 75%  Right lateral flexion Decreased 75%  Left lateral flexion Decreased 75%  Right rotation Decreased 50%  Left rotation Decreased 50%   (Blank rows = not tested)  UPPER EXTREMITY ROM:  Mild limitations in the shoulders just tight, right shoulder UPPER EXTREMITY MMT:  MMT Right eval Left eval  Shoulder flexion 4- 4  Shoulder extension    Shoulder abduction    Shoulder adduction    Shoulder extension    Shoulder internal rotation 3+ 4-  Shoulder external rotation 3+ 4-  Middle trapezius    Lower trapezius    Elbow flexion    Elbow extension    Wrist flexion    Wrist extension    Wrist ulnar deviation    Wrist radial deviation    Wrist pronation    Wrist supination    Grip strength     (Blank rows = not tested)  CERVICAL SPECIAL TESTS:  Spurling's test: Positive Very tight HS and piriformis ODI= 23/50 = 56% BERG 43/56  TODAY'S TREATMENT:  DATE:  05/07/24 Nustep level 5 x 6 mintues 15# HS curls 2x10 Leg extension 5# 2x10 10# straight arm pulls 20# leg press 2x10 15# AR press 2x10 20# lats 2x10 Back to wall butt touches and scapular touches Red tband horizontal abduction Stretch of HS and piriformis STM to the left HS  05/06/24 Nustep level 5 x 6 minutes 15# HS curls 2x10 5# leg extension 25# rows 25# lats 20# leg press Feet on ball K2C, rotation, bridge, isometric abs Passive stretch LE's STM with the Tgun to the left HS  04/29/24 Bike level 5 x 6 minutes 25# lats 25# row 20# AR press 10# straight arm pulls Gait outside around the back building he started having some left HS pain up to 3/10 coming up the  hill Passive stretch STM to the HS  04/22/24 Bike level 3 x 5 minutes 25# lats 25# rows 20# AR press Passive stretch LE's STM to the right HS  04/04/24 Seated rows 25# 2x10 Lats 25# 2x10 10# straight arm pulls 2x10 Leg press 40# 2x10 Leg curls 25# 2x10 Leg extension 5# 2x10 AR press 20# 2x10 5# hip extension 5# hip abduction 10# biceps 25# triceps Red tband back to wall ER and horizontal abduction Feet on ball K2C, bridge, isometric abs  04/02/24 Evaluation  PATIENT EDUCATION:  Education details: POC and HEP Person educated: Patient Education method: Programmer, multimedia, Facilities manager, Verbal cues, and Handouts Education comprehension: verbalized understanding  HOME EXERCISE PROGRAM: Access Code: [email or web address] URL: [email or web address] Date: 04/02/24 Prepared by: Cherylene Corrente  Exercises - Standing Cervical Retraction  - 1 x daily - 7 x weekly - 2 sets - 10 reps - 3 hold - Standing Cervical Retraction with Sidebending  - 1 x daily - 7 x weekly - 2 sets - 10 reps - 3 hold - Seated Scapular Retraction  - 1 x daily - 7 x weekly - 2 sets - 10 reps - 3 hold - Seated Shoulder Shrugs  - 1 x daily - 7 x weekly - 2 sets - 10 reps - 3 hold  ASSESSMENT:  CLINICAL IMPRESSION: Patient was able to walk 2 miles today, c/o fatigue in the back and poor posture, iced after this and reports no bad effects, we continued to work on the HS and went back to adding back ROM and strength, had some pain with the scapular touches coming off the wall  Patient is a 78 y.o. male who was seen today for physical therapy evaluation and treatment for neck pain with some radiculopathy, back pain and some balance issues.  .  Had a one level fusion in 2019, MD diagnosis is spondylosis.  Berg balance 43/56, very limited cervical and lumbar ROM, poor core  OBJECTIVE IMPAIRMENTS: decreased ROM, decreased strength, dizziness, increased muscle spasms, impaired flexibility, postural dysfunction, and pain.    REHAB POTENTIAL: Good  CLINICAL DECISION MAKING: Stable/uncomplicated  EVALUATION COMPLEXITY: Low   GOALS: Goals reviewed with patient? Yes  SHORT TERM GOALS: Target date: 05/02/24  Independent with initial HEP Goal status: met 04/22/24  LONG TERM GOALS: Target date: 07/02/24  Understand posture and body mechanics Goal status: progressing 04/29/24  2.  Have a good ergonomic set up for computer and TV use Goal status: met 05/07/24  3.  Increase cervical ROM 25% Goal status: INITIAL  4.  Decrease pain 25% Goal status: ongoing 04/29/24  5.  Berg balance test to 48/56 Goal status: INITIAL   PLAN:  PT FREQUENCY: 1-2x/week  PT DURATION: 12 weeks  PLANNED INTERVENTIONS: Therapeutic exercises, Therapeutic activity, Neuromuscular re-education, Balance training, Gait training, Patient/Family education, Self Care, Joint mobilization, Vestibular training, Canalith repositioning, Visual/preceptual remediation/compensation, Dry Needling, Electrical stimulation, Spinal mobilization, Moist heat, Traction, Ultrasound, and Manual therapy  PLAN FOR NEXT SESSION: focus on the HS and the low back and get him ready for his trip  Hollis Lurie, PT 05/07/2024, 4:56 PM

## 2024-05-13 ENCOUNTER — Ambulatory Visit: Admitting: Physical Therapy

## 2024-05-13 ENCOUNTER — Encounter: Payer: Self-pay | Admitting: Physical Therapy

## 2024-05-13 DIAGNOSIS — M6281 Muscle weakness (generalized): Secondary | ICD-10-CM

## 2024-05-13 DIAGNOSIS — M542 Cervicalgia: Secondary | ICD-10-CM

## 2024-05-13 DIAGNOSIS — R252 Cramp and spasm: Secondary | ICD-10-CM

## 2024-05-13 DIAGNOSIS — M5459 Other low back pain: Secondary | ICD-10-CM

## 2024-05-13 DIAGNOSIS — M62830 Muscle spasm of back: Secondary | ICD-10-CM

## 2024-05-13 NOTE — Therapy (Signed)
 OUTPATIENT PHYSICAL THERAPY CERVICAL/LUMBAR TREATMENT   Patient Name: George Freeman MRN: [id number] DOB:[birth year], 78 y.o., male Today's Date: 05/13/2024  END OF SESSION:  PT End of Session - 05/13/24 0752     Visit Number 8    Number of Visits 13    Date for PT Re-Evaluation 08/02/24    Authorization Type [employer]    PT Start Time 0750    PT Stop Time 0840    PT Time Calculation (min) 50 min    Activity Tolerance Patient tolerated treatment well    Behavior During Therapy WFL for tasks assessed/performed             Past Medical History:  Diagnosis Date   Anxiety    Arthritis    BPV (benign positional vertigo)    Depression    GERD (gastroesophageal reflux disease)    Neuromuscular disorder (HCC)    Panic disorder    PONV (postoperative nausea and vomiting)    chills and anxiety after surgery   Past Surgical History:  Procedure Laterality Date   ANAL FISSURE REPAIR  1993   APPENDECTOMY  1969   BACK SURGERY  2009   Stenosis, sciatica    BACK SURGERY  2011   Stenosis, sciatica   BACK SURGERY  December 2014   CERVICAL SPINE SURGERY  Feb. 2014   CERVICAL SPINE SURGERY  07/23/15   fusion   COLONOSCOPY     KNEE SURGERY     2x- cartilage   left foot surgery  1985   Left shoulder surgery  1990   Right shoulder surgery  2012   TOTAL KNEE ARTHROPLASTY  January 2011   TOTAL KNEE ARTHROPLASTY  Feb. 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 MD reported spondylosis and did not think surgery was warranted.  Has some tingling in the right shoulder area and in the back of the head.  HE also has LBP.  Reports left shoulder pain and loss of ROM after a vaccination  PERTINENT HISTORY:  Has dizziness and vertigo in the past, 1 level fusion 2019  PAIN:  Are you having pain? Yes: NPRS scale: 3/10 Pain location: neck right side and right shoulder, low back  Pain description: ache, numbness, tingling Aggravating factors: turn head to the right and look up , walkingpain up to 8/10 Relieving factors: Celebrex  1x/day, not move pain can be 0/10, he is currently trying to not take the pain medicine   PRECAUTIONS: None  WEIGHT BEARING RESTRICTIONS: No  FALLS:  Has patient fallen in last 6 months? No  LIVING ENVIRONMENT: Lives with: lives with their family and lives with their spouse Lives in: House/apartment Stairs: No Has following equipment at home: None  OCCUPATION: retired  PLOF: Independent and minimal pain,   PATIENT GOALS: have less pain  NEXT MD VISIT:   OBJECTIVE:   DIAGNOSTIC FINDINGS:  Cervical spondylosis  COGNITION: Overall cognitive status: Within functional limits for tasks assessed  SENSATION: Right back of head and neck, right upper trap  POSTURE: rounded shoulders and forward head, decreased lordosis  PALPATION: Mild tenderness in the upper trap and the neck, tight in the right  neck area, tight in the lumbar area  LUMBAR ROM:  Decreased 50% for flexion, decreased 75% for other motions CERVICAL ROM:   Active ROM A/PROM (deg) eval  Flexion Decreased 25%  Extension Decreased 75%  Right lateral flexion Decreased 75%  Left lateral flexion Decreased 75%  Right rotation Decreased 50%  Left rotation Decreased 50%   (Blank rows = not tested)  UPPER EXTREMITY ROM:  Mild limitations in the shoulders just tight, right shoulder UPPER EXTREMITY MMT:  MMT Right eval Left eval  Shoulder flexion 4- 4  Shoulder extension    Shoulder abduction    Shoulder adduction    Shoulder extension    Shoulder internal rotation 3+ 4-  Shoulder external rotation 3+ 4-  Middle trapezius    Lower trapezius    Elbow flexion    Elbow extension    Wrist flexion    Wrist extension    Wrist ulnar deviation    Wrist radial deviation    Wrist pronation    Wrist supination    Grip strength     (Blank rows = not tested)  CERVICAL SPECIAL TESTS:  Spurling's test: Positive Very tight HS and piriformis ODI= 23/50 = 56% BERG 43/56  TODAY'S TREATMENT:                                                                                                                              DATE:  05/13/24  Nustep level 4 x 6 minutes Walk outside around the back building brisk pace Leg curls 20# 2x10 Leg extension 5# 2x10 10# straight arm pulls 20# rows 20# lats Feet on ball K2C, rotation, small bridge, isometric abs LE stretches   05/07/24 Nustep level 5 x 6 mintues 15# HS curls 2x10 Leg extension 5# 2x10 10# straight arm pulls 20# leg press 2x10 15# AR press 2x10 20# lats 2x10 Back to wall butt touches and scapular touches Red tband horizontal abduction Stretch of HS and piriformis STM to the left HS  05/06/24 Nustep level 5 x 6 minutes 15# HS curls 2x10 5# leg extension 25# rows 25# lats 20# leg press Feet on ball K2C, rotation, bridge, isometric abs Passive stretch  LE's STM with the Tgun to the left HS  04/29/24 Bike level 5 x 6 minutes 25# lats 25# row 20# AR press 10# straight arm pulls Gait outside around the back building he started having some left HS pain up to 3/10 coming up the hill Passive stretch STM to the HS  04/22/24 Bike level 3 x 5 minutes 25# lats 25# rows 20# AR press Passive stretch LE's STM to the right HS  04/04/24 Seated rows 25# 2x10 Lats 25# 2x10 10# straight arm pulls 2x10 Leg press 40# 2x10 Leg curls 25# 2x10 Leg extension 5# 2x10 AR press 20# 2x10 5# hip extension 5# hip abduction 10# biceps 25# triceps Red tband back to wall ER and horizontal abduction Feet on ball K2C, bridge, isometric abs  04/02/24 Evaluation  PATIENT EDUCATION:  Education details: POC and HEP Person educated: Patient Education method: Programmer, multimedia, Facilities manager, Verbal cues, and Handouts Education comprehension: verbalized understanding  HOME EXERCISE PROGRAM: Access Code: [email or web address] URL: [email or web address] Date: 04/02/24 Prepared by: Cherylene Corrente  Exercises - Standing Cervical Retraction  - 1 x daily - 7 x weekly - 2 sets - 10 reps - 3 hold - Standing Cervical Retraction with Sidebending  - 1 x daily - 7 x weekly - 2 sets - 10 reps - 3 hold - Seated Scapular Retraction  - 1 x daily - 7 x weekly - 2 sets - 10 reps - 3 hold - Seated Shoulder Shrugs  - 1 x daily - 7 x weekly - 2 sets - 10 reps - 3 hold  ASSESSMENT:  CLINICAL IMPRESSION: During walk as he fatigues he does get forward flexed posture, we talked about the use of hiking poles one or two to see if this helps the pain and the posture, he may bring one in for the next treatment and we can look to see if this would work for him.  Patient is a 78 y.o. male who was seen today for physical therapy evaluation and treatment for neck pain with some radiculopathy, back pain and some balance issues.  .  Had a one level fusion in 2019, MD diagnosis is  spondylosis.  Berg balance 43/56, very limited cervical and lumbar ROM, poor core  OBJECTIVE IMPAIRMENTS: decreased ROM, decreased strength, dizziness, increased muscle spasms, impaired flexibility, postural dysfunction, and pain.   REHAB POTENTIAL: Good  CLINICAL DECISION MAKING: Stable/uncomplicated  EVALUATION COMPLEXITY: Low   GOALS: Goals reviewed with patient? Yes  SHORT TERM GOALS: Target date: 05/02/24  Independent with initial HEP Goal status: met 04/22/24  LONG TERM GOALS: Target date: 07/02/24  Understand posture and body mechanics Goal status: progressing 04/29/24  2.  Have a good ergonomic set up for computer and TV use Goal status: met 05/07/24  3.  Increase cervical ROM 25% Goal status: INITIAL  4.  Decrease pain 25% Goal status: ongoing 04/29/24  5.  Berg balance test to 48/56 Goal status: INITIAL   PLAN:  PT FREQUENCY: 1-2x/week  PT DURATION: 12 weeks  PLANNED INTERVENTIONS: Therapeutic exercises, Therapeutic activity, Neuromuscular re-education, Balance training, Gait training, Patient/Family education, Self Care, Joint mobilization, Vestibular training, Canalith repositioning, Visual/preceptual remediation/compensation, Dry Needling, Electrical stimulation, Spinal mobilization, Moist heat, Traction, Ultrasound, and Manual therapy  PLAN FOR NEXT SESSION: focus on the HS and the low back and get him ready for his trip  Hollis Lurie, PT 05/13/2024, 7:53 AM

## 2024-05-14 ENCOUNTER — Encounter: Payer: Self-pay | Admitting: Physical Therapy

## 2024-05-14 ENCOUNTER — Ambulatory Visit: Admitting: Physical Therapy

## 2024-05-14 DIAGNOSIS — M6281 Muscle weakness (generalized): Secondary | ICD-10-CM

## 2024-05-14 DIAGNOSIS — M5459 Other low back pain: Secondary | ICD-10-CM

## 2024-05-14 DIAGNOSIS — M542 Cervicalgia: Secondary | ICD-10-CM | POA: Diagnosis not present

## 2024-05-14 DIAGNOSIS — M62830 Muscle spasm of back: Secondary | ICD-10-CM

## 2024-05-14 DIAGNOSIS — R252 Cramp and spasm: Secondary | ICD-10-CM

## 2024-05-14 NOTE — Therapy (Signed)
 OUTPATIENT PHYSICAL THERAPY CERVICAL/LUMBAR TREATMENT   Patient Name: George Freeman MRN: [id number] DOB:1946-08-15, 78 y.o., male Today's Date: 05/14/2024  END OF SESSION:  PT End of Session - 05/14/24 1408     Visit Number 9    Number of Visits 13    Date for PT Re-Evaluation 08/02/24    Authorization Type [employer]    PT Start Time 1353    PT Stop Time 1440    PT Time Calculation (min) 47 min    Activity Tolerance Patient tolerated treatment well    Behavior During Therapy WFL for tasks assessed/performed             Past Medical History:  Diagnosis Date   Anxiety    Arthritis    BPV (benign positional vertigo)    Depression    GERD (gastroesophageal reflux disease)    Neuromuscular disorder (HCC)    Panic disorder    PONV (postoperative nausea and vomiting)    chills and anxiety after surgery   Past Surgical History:  Procedure Laterality Date   ANAL FISSURE REPAIR  1993   APPENDECTOMY  1969   BACK SURGERY  2009   Stenosis, sciatica    BACK SURGERY  2011   Stenosis, sciatica   BACK SURGERY  December 2014   CERVICAL SPINE SURGERY  Feb. 2014   CERVICAL SPINE SURGERY  07/23/15   fusion   COLONOSCOPY     KNEE SURGERY     2x- cartilage   left foot surgery  1985   Left shoulder surgery  1990   Right shoulder surgery  2012   TOTAL KNEE ARTHROPLASTY  January 2011   TOTAL KNEE ARTHROPLASTY  Feb. 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 Yes: NPRS scale: 3/10 Pain location: neck right side and right shoulder, low back  Pain description: ache, numbness, tingling Aggravating factors: turn head to the right and look up , walkingpain up to 8/10 Relieving factors: Celebrex  1x/day, not move pain can be 0/10, he is currently trying to not take the pain medicine   PRECAUTIONS: None  WEIGHT BEARING RESTRICTIONS: No  FALLS:  Has patient fallen in last 6 months? No  LIVING ENVIRONMENT: Lives with: lives with their family and lives with their spouse Lives in: House/apartment Stairs: No Has following equipment at home: None  OCCUPATION: retired  PLOF: Independent and minimal pain,   PATIENT GOALS: have less pain  NEXT MD VISIT:   OBJECTIVE:   DIAGNOSTIC FINDINGS:  Cervical spondylosis  COGNITION: Overall cognitive status: Within functional limits for tasks assessed  SENSATION: Right back of head and neck, right upper trap  POSTURE: rounded shoulders and forward head, decreased lordosis  PALPATION: Mild tenderness in the upper trap and the neck, tight in the right neck area, tight in the lumbar area  LUMBAR ROM:  Decreased 50% for flexion, decreased 75% for other motions CERVICAL ROM:   Active ROM A/PROM (deg) eval  Flexion Decreased 25%  Extension Decreased 75%  Right lateral flexion  Decreased 75%  Left lateral flexion Decreased 75%  Right rotation Decreased 50%  Left rotation Decreased 50%   (Blank rows = not tested)  UPPER EXTREMITY ROM:  Mild limitations in the shoulders just tight, right shoulder UPPER EXTREMITY MMT:  MMT Right eval Left eval  Shoulder flexion 4- 4  Shoulder extension    Shoulder abduction    Shoulder adduction    Shoulder extension    Shoulder internal rotation 3+ 4-  Shoulder external rotation 3+ 4-  Middle trapezius    Lower trapezius    Elbow flexion    Elbow extension    Wrist flexion    Wrist extension    Wrist ulnar deviation    Wrist radial deviation    Wrist pronation    Wrist supination    Grip strength     (Blank rows = not tested)  CERVICAL SPECIAL TESTS:  Spurling's test: Positive Very tight HS and piriformis ODI= 23/50 = 56% BERG 43/56  TODAY'S TREATMENT:                                                                                                                              DATE:  05/14/24 Gait outside with his hiking sticks, worked on sequencing, height adjustments and how to use one vs two Nustep level 5 x 5 minutes Feet on ball K2C, rotation, small bridge, isometric abs Passive stretch LE's STM to the left HS Education on self  stretch and self care while on this trip   05/13/24 Nustep level 4 x 6 minutes Walk outside around the back building brisk pace Leg curls 20# 2x10 Leg extension 5# 2x10 10# straight arm pulls 20# rows 20# lats Feet on ball K2C, rotation, small bridge, isometric abs LE stretches   05/07/24 Nustep level 5 x 6 mintues 15# HS curls 2x10 Leg extension 5# 2x10 10# straight arm pulls 20# leg press 2x10 15# AR press 2x10 20# lats 2x10 Back to wall butt touches and scapular touches Red tband horizontal abduction Stretch of HS and piriformis STM to the left HS  05/06/24 Nustep level 5 x 6 minutes 15# HS curls 2x10 5# leg extension 25# rows 25# lats 20# leg  press Feet on ball K2C, rotation, bridge, isometric abs Passive stretch LE's STM with the Tgun to the left HS  04/29/24 Bike level 5 x 6 minutes 25# lats 25# row 20# AR press 10# straight arm pulls Gait outside around the back building he started having some left HS pain up to 3/10 coming up the hill Passive stretch STM to the HS  04/22/24 Bike level 3 x 5 minutes 25# lats 25# rows 20# AR press Passive stretch LE's STM to the right HS  04/04/24 Seated rows 25# 2x10 Lats 25# 2x10 10# straight arm pulls 2x10 Leg press 40# 2x10 Leg curls 25# 2x10 Leg extension 5# 2x10 AR press 20# 2x10 5# hip extension 5# hip abduction 10# biceps 25# triceps Red tband back to wall ER and horizontal abduction Feet on ball K2C, bridge, isometric abs  04/02/24 Evaluation  PATIENT EDUCATION:  Education details: POC and HEP Person educated: Patient Education method: Programmer, multimedia, Facilities manager, Verbal cues, and Handouts Education comprehension: verbalized understanding  HOME EXERCISE PROGRAM: Access Code: [email or web address] URL: [email or web address] Date: 04/02/24 Prepared by: Cherylene Corrente  Exercises - Standing Cervical Retraction  - 1 x daily - 7 x weekly - 2 sets - 10 reps - 3 hold - Standing Cervical Retraction with Sidebending  - 1 x daily - 7 x weekly - 2 sets - 10 reps - 3 hold - Seated Scapular Retraction  - 1 x daily - 7 x weekly - 2 sets - 10 reps - 3 hold - Seated Shoulder Shrugs  - 1 x daily - 7 x weekly - 2 sets - 10 reps - 3 hold  ASSESSMENT:  CLINICAL IMPRESSION: Reviewed self care and stretching as well as the use of the walking sticks, how to adjust, sequencing and the use of one vs two, he is feeling good with less pain, still tender in the left HS.  Patient is a 78 y.o. male who was seen today for physical therapy evaluation and treatment for neck pain with some radiculopathy, back pain and some balance issues.  .  Had a one level fusion in 2019, MD diagnosis  is spondylosis.  Berg balance 43/56, very limited cervical and lumbar ROM, poor core  OBJECTIVE IMPAIRMENTS: decreased ROM, decreased strength, dizziness, increased muscle spasms, impaired flexibility, postural dysfunction, and pain.   REHAB POTENTIAL: Good  CLINICAL DECISION MAKING: Stable/uncomplicated  EVALUATION COMPLEXITY: Low   GOALS: Goals reviewed with patient? Yes  SHORT TERM GOALS: Target date: 05/02/24  Independent with initial HEP Goal status: met 04/22/24  LONG TERM GOALS: Target date: 07/02/24  Understand posture and body mechanics Goal status: progressing 05/14/24  2.  Have a good ergonomic set up for computer and TV use Goal status: met 05/07/24  3.  Increase cervical  ROM 25% Goal status: met 05/14/24  4.  Decrease pain 25% Goal status: ongoing 04/29/24  5.  Berg balance test to 48/56 Goal status: INITIAL   PLAN:  PT FREQUENCY: 1-2x/week  PT DURATION: 12 weeks  PLANNED INTERVENTIONS: Therapeutic exercises, Therapeutic activity, Neuromuscular re-education, Balance training, Gait training, Patient/Family education, Self Care, Joint mobilization, Vestibular training, Canalith repositioning, Visual/preceptual remediation/compensation, Dry Needling, Electrical stimulation, Spinal mobilization, Moist heat, Traction, Ultrasound, and Manual therapy  PLAN FOR NEXT SESSION: will hold while he is on vacation Hollis Lurie, PT 05/14/2024, 2:08 PM

## 2024-06-04 ENCOUNTER — Encounter: Payer: Self-pay | Admitting: Physical Therapy

## 2024-06-04 ENCOUNTER — Ambulatory Visit: Attending: Family Medicine | Admitting: Physical Therapy

## 2024-06-04 DIAGNOSIS — M5459 Other low back pain: Secondary | ICD-10-CM | POA: Insufficient documentation

## 2024-06-04 DIAGNOSIS — M62830 Muscle spasm of back: Secondary | ICD-10-CM | POA: Insufficient documentation

## 2024-06-04 DIAGNOSIS — M6281 Muscle weakness (generalized): Secondary | ICD-10-CM | POA: Insufficient documentation

## 2024-06-04 DIAGNOSIS — M542 Cervicalgia: Secondary | ICD-10-CM | POA: Insufficient documentation

## 2024-06-04 DIAGNOSIS — R252 Cramp and spasm: Secondary | ICD-10-CM | POA: Diagnosis present

## 2024-06-04 NOTE — Therapy (Signed)
 OUTPATIENT PHYSICAL THERAPY CERVICAL/LUMBAR TREATMENT Progress Note Reporting Period 04/02/24 to 06/04/24  See note below for Objective Data and Assessment of Progress/Goals.      Patient Name: George Freeman MRN: [id number] DOB:1946-09-24, 78 y.o., male Today's Date: 06/04/2024  END OF SESSION:  PT End of Session - 06/04/24 0934     Visit Number 10    Number of Visits 13    Date for PT Re-Evaluation 08/02/24    Authorization Type [employer]    PT Start Time 0927    PT Stop Time 1014    PT Time Calculation (min) 47 min    Activity Tolerance Patient tolerated treatment well    Behavior During Therapy WFL for tasks assessed/performed             Past Medical History:  Diagnosis Date   Anxiety    Arthritis    BPV (benign positional vertigo)    Depression    GERD (gastroesophageal reflux disease)    Neuromuscular disorder (HCC)    Panic disorder    PONV (postoperative nausea and vomiting)    chills and anxiety after surgery   Past Surgical History:  Procedure Laterality Date   ANAL FISSURE REPAIR  1993   APPENDECTOMY  1969   BACK SURGERY  2009   Stenosis, sciatica    BACK SURGERY  2011   Stenosis, sciatica   BACK SURGERY  December 2014   CERVICAL SPINE SURGERY  Feb. 2014   CERVICAL SPINE SURGERY  07/23/15   fusion   COLONOSCOPY     KNEE SURGERY     2x- cartilage   left foot surgery  1985   Left shoulder surgery  1990   Right shoulder surgery  2012   TOTAL KNEE ARTHROPLASTY  January 2011   TOTAL KNEE ARTHROPLASTY  Feb. 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 02/12/2013   Pain of right heel  12/18/2012    PCP: Daivd Dub, MD  REFERRING PROVIDER: Jyl Or MD  REFERRING DIAG: cervical spondylosis  THERAPY DIAG:  Other low back pain  Muscle spasm of back  Cramp and spasm  Muscle weakness (generalized)  Cervicalgia  Rationale for Evaluation and Treatment: Rehabilitation  ONSET DATE: 03/19/24  SUBJECTIVE:  SUBJECTIVE STATEMENT: Patient was out for two weeks, he reports that he got sick and then the travel really got his back and his HS tight and sore and really hurting when I walk  PERTINENT HISTORY:  Has dizziness and vertigo in the past, 1 level fusion 2019  PAIN:  Are you having pain? Yes: NPRS scale: 3/10 Pain location: neck right side and right shoulder, low back  Pain description: ache, numbness, tingling Aggravating factors: turn head to the right and look up , walkingpain up to 8/10 Relieving factors: Celebrex  1x/day, not move pain can be 0/10, he is currently trying to not take the pain medicine   PRECAUTIONS: None  WEIGHT BEARING RESTRICTIONS: No  FALLS:  Has patient fallen in last 6 months? No  LIVING ENVIRONMENT: Lives with: lives with their family and lives with their spouse Lives in: House/apartment Stairs: No Has following equipment at home: None  OCCUPATION: retired  PLOF: Independent and minimal pain,   PATIENT GOALS: have less pain  NEXT MD VISIT:   OBJECTIVE:   DIAGNOSTIC FINDINGS:  Cervical spondylosis  COGNITION: Overall cognitive status: Within functional limits for tasks assessed  SENSATION: Right back of head and neck, right upper trap  POSTURE: rounded shoulders and forward head, decreased lordosis  PALPATION: Mild tenderness in the upper trap and the neck, tight in the right neck area, tight in the lumbar  area  LUMBAR ROM:  Decreased 50% for flexion, decreased 75% for other motions CERVICAL ROM:   Active ROM A/PROM (deg) eval  Flexion Decreased 25%  Extension Decreased 75%  Right lateral flexion Decreased 75%  Left lateral flexion Decreased 75%  Right rotation Decreased 50%  Left rotation Decreased 50%   (Blank rows = not tested)  UPPER EXTREMITY ROM:  Mild limitations in the shoulders just tight, right shoulder UPPER EXTREMITY MMT:  MMT Right eval Left eval  Shoulder flexion 4- 4  Shoulder extension    Shoulder abduction    Shoulder adduction    Shoulder extension    Shoulder internal rotation 3+ 4-  Shoulder external rotation 3+ 4-  Middle trapezius    Lower trapezius    Elbow flexion    Elbow extension    Wrist flexion    Wrist extension    Wrist ulnar deviation    Wrist radial deviation    Wrist pronation    Wrist supination    Grip strength     (Blank rows = not tested)  CERVICAL SPECIAL TESTS:  Spurling's test: Positive Very tight HS and piriformis ODI= 23/50 = 56% BERG 43/56  TODAY'S TREATMENT:                                                                                                                              DATE:  06/04/24 Nustep level 5 x 6 minutes Leg curls 20# 2x15 10# straight arm pulls 15# rows 20# lats 20# leg press Feet on ball K2C, rotation, bridge  and isometric abs Passive stretch LE's  05/14/24 Gait outside with his hiking sticks, worked on sequencing, height adjustments and how to use one vs two Nustep level 5 x 5 minutes Feet on ball K2C, rotation, small bridge, isometric abs Passive stretch LE's STM to the left HS Education on self stretch and self care while on this trip   05/13/24 Nustep level 4 x 6 minutes Walk outside around the back building brisk pace Leg curls 20# 2x10 Leg extension 5# 2x10 10# straight arm pulls 20# rows 20# lats Feet on ball K2C, rotation, small bridge, isometric abs LE  stretches   05/07/24 Nustep level 5 x 6 mintues 15# HS curls 2x10 Leg extension 5# 2x10 10# straight arm pulls 20# leg press 2x10 15# AR press 2x10 20# lats 2x10 Back to wall butt touches and scapular touches Red tband horizontal abduction Stretch of HS and piriformis STM to the left HS  05/06/24 Nustep level 5 x 6 minutes 15# HS curls 2x10 5# leg extension 25# rows 25# lats 20# leg press Feet on ball K2C, rotation, bridge, isometric abs Passive stretch LE's STM with the Tgun to the left HS  04/29/24 Bike level 5 x 6 minutes 25# lats 25# row 20# AR press 10# straight arm pulls Gait outside around the back building he started having some left HS pain up to 3/10 coming up the hill Passive stretch STM to the HS  04/22/24 Bike level 3 x 5 minutes 25# lats 25# rows 20# AR press Passive stretch LE's STM to the right HS  04/04/24 Seated rows 25# 2x10 Lats 25# 2x10 10# straight arm pulls 2x10 Leg press 40# 2x10 Leg curls 25# 2x10 Leg extension 5# 2x10 AR press 20# 2x10 5# hip extension 5# hip abduction 10# biceps 25# triceps Red tband back to wall ER and horizontal abduction Feet on ball K2C, bridge, isometric abs  04/02/24 Evaluation  PATIENT EDUCATION:  Education details: POC and HEP Person educated: Patient Education method: Programmer, multimedia, Facilities manager, Verbal cues, and Handouts Education comprehension: verbalized understanding  HOME EXERCISE PROGRAM: Access Code: [email or web address] URL: [email or web address] Date: 04/02/24 Prepared by: Cherylene Corrente  Exercises - Standing Cervical Retraction  - 1 x daily - 7 x weekly - 2 sets - 10 reps - 3 hold - Standing Cervical Retraction with Sidebending  - 1 x daily - 7 x weekly - 2 sets - 10 reps - 3 hold - Seated Scapular Retraction  - 1 x daily - 7 x weekly - 2 sets - 10 reps - 3 hold - Seated Shoulder Shrugs  - 1 x daily - 7 x weekly - 2 sets - 10 reps - 3 hold  ASSESSMENT:  CLINICAL  IMPRESSION: Patient was on a vacation to [place], for the past 3 weeks.  He reports that he got sick while away and reports that the travel did cause some LBP and HS tension, reports just feels tight but was not able to do much.  Patient is a 78 y.o. male who was seen today for physical therapy evaluation and treatment for neck pain with some radiculopathy, back pain and some balance issues.  .  Had a one level fusion in 2019, MD diagnosis is spondylosis.  Berg balance 43/56, very limited cervical and lumbar ROM, poor core  OBJECTIVE IMPAIRMENTS: decreased ROM, decreased strength, dizziness, increased muscle spasms, impaired flexibility, postural dysfunction, and pain.   REHAB POTENTIAL: Good  CLINICAL DECISION MAKING: Stable/uncomplicated  EVALUATION COMPLEXITY: Low   GOALS: Goals reviewed with  patient? Yes  SHORT TERM GOALS: Target date: 05/02/24  Independent with initial HEP Goal status: met 04/22/24  LONG TERM GOALS: Target date: 07/02/24  Understand posture and body mechanics Goal status: progressing 05/14/24  2.  Have a good ergonomic set up for computer and TV use Goal status: met 05/07/24  3.  Increase cervical ROM 25% Goal status: met 05/14/24  4.  Decrease pain 25% Goal status: progressing 06/04/24  5.  Berg balance test to 48/56 Goal status: progressing 06/04/24   PLAN:  PT FREQUENCY: 1-2x/week  PT DURATION: 12 weeks  PLANNED INTERVENTIONS: Therapeutic exercises, Therapeutic activity, Neuromuscular re-education, Balance training, Gait training, Patient/Family education, Self Care, Joint mobilization, Vestibular training, Canalith repositioning, Visual/preceptual remediation/compensation, Dry Needling, Electrical stimulation, Spinal mobilization, Moist heat, Traction, Ultrasound, and Manual therapy  PLAN FOR NEXT SESSION: resume treatment, educate on him doing on his own Hollis Lurie, PT 06/04/2024, 9:35 AM

## 2024-06-06 ENCOUNTER — Ambulatory Visit: Admitting: Physical Therapy

## 2024-06-06 ENCOUNTER — Encounter: Payer: Self-pay | Admitting: Physical Therapy

## 2024-06-06 DIAGNOSIS — M6281 Muscle weakness (generalized): Secondary | ICD-10-CM

## 2024-06-06 DIAGNOSIS — M5459 Other low back pain: Secondary | ICD-10-CM | POA: Diagnosis not present

## 2024-06-06 DIAGNOSIS — M542 Cervicalgia: Secondary | ICD-10-CM

## 2024-06-06 DIAGNOSIS — R252 Cramp and spasm: Secondary | ICD-10-CM

## 2024-06-06 DIAGNOSIS — M62830 Muscle spasm of back: Secondary | ICD-10-CM

## 2024-06-06 NOTE — Therapy (Signed)
 OUTPATIENT PHYSICAL THERAPY CERVICAL/LUMBAR TREATMENT    Patient Name: George Freeman MRN: [id number] DOB:[birth year], 78 y.o., male Today's Date: 06/06/2024  END OF SESSION:  PT End of Session - 06/06/24 0932     Visit Number 11    Number of Visits 13    Date for PT Re-Evaluation 07/03/24    Authorization Type [employer]    PT Start Time 0928    PT Stop Time 1015    PT Time Calculation (min) 47 min    Activity Tolerance Patient tolerated treatment well    Behavior During Therapy WFL for tasks assessed/performed          Past Medical History:  Diagnosis Date   Anxiety    Arthritis    BPV (benign positional vertigo)    Depression    GERD (gastroesophageal reflux disease)    Neuromuscular disorder (HCC)    Panic disorder    PONV (postoperative nausea and vomiting)    chills and anxiety after surgery   Past Surgical History:  Procedure Laterality Date   ANAL FISSURE REPAIR  1993   APPENDECTOMY  1969   BACK SURGERY  2009   Stenosis, sciatica    BACK SURGERY  2011   Stenosis, sciatica   BACK SURGERY  December 2014   CERVICAL SPINE SURGERY  Feb. 2014   CERVICAL SPINE SURGERY  07/23/15   fusion   COLONOSCOPY     KNEE SURGERY     2x- cartilage   left foot surgery  1985   Left shoulder surgery  1990   Right shoulder surgery  2012   TOTAL KNEE ARTHROPLASTY  January 2011   TOTAL KNEE ARTHROPLASTY  Feb. 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 Yes: NPRS scale: 3/10 Pain location: neck right side and right shoulder, low back  Pain description: ache, numbness, tingling Aggravating factors: turn head to the right and look up , walkingpain up to 8/10 Relieving factors: Celebrex  1x/day, not move pain can be 0/10, he is currently trying to not take the pain medicine   PRECAUTIONS: None  WEIGHT BEARING RESTRICTIONS: No  FALLS:  Has patient fallen in last 6 months? No  LIVING ENVIRONMENT: Lives with: lives with their family and lives with their spouse Lives in: House/apartment Stairs: No Has following equipment at home: None  OCCUPATION: retired  PLOF: Independent and minimal pain,   PATIENT GOALS: have less pain  NEXT MD VISIT:   OBJECTIVE:   DIAGNOSTIC FINDINGS:  Cervical spondylosis  COGNITION: Overall cognitive status: Within functional limits for tasks assessed  SENSATION: Right back of head and neck, right upper trap  POSTURE: rounded shoulders and forward head, decreased lordosis  PALPATION: Mild tenderness in the upper trap and the neck, tight in the right neck area, tight in the lumbar area  LUMBAR ROM:  Decreased 50% for flexion, decreased 75% for other motions CERVICAL ROM:   Active ROM A/PROM (deg) eval  Flexion Decreased 25%  Extension  Decreased 75%  Right lateral flexion Decreased 75%  Left lateral flexion Decreased 75%  Right rotation Decreased 50%  Left rotation Decreased 50%   (Blank rows = not tested)  UPPER EXTREMITY ROM:  Mild limitations in the shoulders just tight, right shoulder UPPER EXTREMITY MMT:  MMT Right eval Left eval  Shoulder flexion 4- 4  Shoulder extension    Shoulder abduction    Shoulder adduction    Shoulder extension    Shoulder internal rotation 3+ 4-  Shoulder external rotation 3+ 4-  Middle trapezius    Lower trapezius    Elbow flexion    Elbow extension    Wrist flexion    Wrist extension    Wrist ulnar deviation    Wrist radial deviation    Wrist pronation    Wrist supination    Grip strength     (Blank rows = not tested)  CERVICAL SPECIAL TESTS:  Spurling's test: Positive Very tight HS and piriformis ODI= 23/50 = 56% BERG 43/56  TODAY'S TREATMENT:                                                                                                                              DATE:  06/06/24 Nustep level 5 x 7 minutes Leg curls 20# 2x15 Leg extension 5# 2x10 10# straight arm pulls LEg press 20# 2x10 Seated rows 20# Lats 20# Black tband seated extension 9# farmer carry  Feet on ball K2C, rotation, small bridge, isometric abs LE stretches  06/04/24 Nustep level 5 x 6 minutes Leg curls 20# 2x15 10# straight arm pulls 15# rows 20# lats 20# leg press Feet on ball K2C, rotation, bridge and isometric abs Passive stretch LE's  05/14/24 Gait outside with his hiking sticks, worked on sequencing, height adjustments and how to use one vs two Nustep level 5 x 5 minutes Feet on ball K2C, rotation, small bridge, isometric abs Passive stretch LE's STM to the left HS Education on self stretch and self care while on this trip   05/13/24 Nustep level 4 x 6 minutes Walk outside around the back building brisk pace Leg curls 20# 2x10 Leg extension 5# 2x10 10# straight arm  pulls 20# rows 20# lats Feet on ball K2C, rotation, small bridge, isometric abs LE stretches   05/07/24 Nustep level 5 x 6 mintues 15# HS curls 2x10 Leg extension 5# 2x10 10# straight arm pulls 20# leg press 2x10 15# AR press 2x10 20# lats 2x10 Back to wall butt touches and scapular touches Red tband horizontal abduction Stretch of HS and piriformis STM to the left HS  05/06/24 Nustep level 5 x 6 minutes 15# HS curls 2x10 5# leg extension 25# rows 25# lats 20# leg press Feet on ball K2C, rotation, bridge, isometric abs Passive stretch LE's STM with the Tgun to the left HS  04/29/24 Bike level 5 x 6 minutes 25# lats 25# row 20# AR press 10# straight arm pulls Gait outside around the back building he started having some left HS pain up to 3/10 coming up the hill Passive stretch STM to the HS  04/22/24 Bike level 3 x 5 minutes 25# lats 25# rows 20# AR press Passive stretch LE's STM to the right HS  04/04/24 Seated rows 25# 2x10 Lats 25# 2x10 10# straight arm pulls 2x10 Leg press 40# 2x10 Leg curls 25# 2x10 Leg extension 5# 2x10 AR press 20# 2x10 5# hip extension 5# hip abduction 10# biceps 25# triceps Red tband back to wall ER and horizontal abduction Feet on ball K2C, bridge, isometric abs  04/02/24 Evaluation  PATIENT EDUCATION:  Education details: POC and HEP Person educated: Patient Education method: Programmer, multimedia, Facilities manager, Verbal cues, and Handouts Education comprehension: verbalized understanding  HOME EXERCISE PROGRAM: Access Code: [email or web address] URL: [email or web address] Date: 04/02/24 Prepared by: Cherylene Corrente  Exercises - Standing Cervical Retraction  - 1 x daily - 7 x weekly - 2 sets - 10 reps - 3 hold - Standing Cervical Retraction with Sidebending  - 1 x daily - 7 x weekly - 2 sets - 10 reps - 3 hold - Seated Scapular Retraction  - 1 x daily - 7 x weekly - 2 sets - 10 reps - 3 hold - Seated Shoulder Shrugs  - 1 x  daily - 7 x weekly - 2 sets - 10 reps - 3 hold  ASSESSMENT:  CLINICAL IMPRESSION: Patient reports that he really feels what we did earlier this week helped, he did ask about gym activity and we started that conversation.  He has had some ups and downs and little set back but seems to respond well to our treatments.  If he is going to do a gym program I do feel that he will need some education on machines, posture, form and safety  Patient is a 78 y.o. male who was seen today for physical therapy evaluation and treatment for neck pain with some radiculopathy, back pain and some  balance issues.  .  Had a one level fusion in 2019, MD diagnosis is spondylosis.  Berg balance 43/56, very limited cervical and lumbar ROM, poor core  OBJECTIVE IMPAIRMENTS: decreased ROM, decreased strength, dizziness, increased muscle spasms, impaired flexibility, postural dysfunction, and pain.   REHAB POTENTIAL: Good  CLINICAL DECISION MAKING: Stable/uncomplicated  EVALUATION COMPLEXITY: Low   GOALS: Goals reviewed with patient? Yes  SHORT TERM GOALS: Target date: 05/02/24  Independent with initial HEP Goal status: met 04/22/24  LONG TERM GOALS: Target date: 07/02/24  Understand posture and body mechanics Goal status: progressing 05/14/24  2.  Have a good ergonomic set up for computer and TV use Goal status: met 05/07/24  3.  Increase cervical ROM 25% Goal status: met 05/14/24  4.  Decrease pain 25% Goal status: progressing 06/04/24  5.  Berg balance test to 48/56 Goal status: progressing 06/04/24   PLAN:  PT FREQUENCY: 1-2x/week  PT DURATION: 12 weeks  PLANNED INTERVENTIONS: Therapeutic exercises, Therapeutic activity, Neuromuscular re-education, Balance training, Gait training, Patient/Family education, Self Care, Joint mobilization, Vestibular training, Canalith repositioning, Visual/preceptual remediation/compensation, Dry Needling, Electrical stimulation, Spinal mobilization, Moist heat,  Traction, Ultrasound, and Manual therapy  PLAN FOR NEXT SESSION:start the education on gym program Hollis Lurie, PT 06/06/2024, 9:33 AM

## 2024-06-07 ENCOUNTER — Ambulatory Visit: Admitting: Physical Therapy

## 2024-06-07 DIAGNOSIS — M6281 Muscle weakness (generalized): Secondary | ICD-10-CM | POA: Diagnosis present

## 2024-06-07 DIAGNOSIS — M542 Cervicalgia: Secondary | ICD-10-CM | POA: Diagnosis present

## 2024-06-07 DIAGNOSIS — R252 Cramp and spasm: Secondary | ICD-10-CM | POA: Insufficient documentation

## 2024-06-07 DIAGNOSIS — M5459 Other low back pain: Secondary | ICD-10-CM | POA: Insufficient documentation

## 2024-06-07 DIAGNOSIS — M62830 Muscle spasm of back: Secondary | ICD-10-CM | POA: Insufficient documentation

## 2024-06-07 DIAGNOSIS — M25512 Pain in left shoulder: Secondary | ICD-10-CM | POA: Insufficient documentation

## 2024-06-07 DIAGNOSIS — R278 Other lack of coordination: Secondary | ICD-10-CM | POA: Insufficient documentation

## 2024-06-07 DIAGNOSIS — R42 Dizziness and giddiness: Secondary | ICD-10-CM | POA: Insufficient documentation

## 2024-06-07 NOTE — Therapy (Signed)
 OUTPATIENT PHYSICAL THERAPY MALE PELVIC TREATMENT   Patient Name: AIRAM HEIDECKER MRN: [id number] DOB:March 15, 1946, 78 y.o., male Today's Date: 06/07/2024  END OF SESSION:  PT End of Session - 06/07/24 0826     Visit Number 2    Date for PT Re-Evaluation 07/25/24    Authorization Type [employer] approved 8 visits 05/02/24-08/02/24 auth#[id number]  [employer] MCR 2025    PT Start Time 0800    PT Stop Time 0845    PT Time Calculation (min) 45 min    Activity Tolerance Patient tolerated treatment well    Behavior During Therapy WFL for tasks assessed/performed           Past Medical History:  Diagnosis Date   Anxiety    Arthritis    BPV (benign positional vertigo)    Depression    GERD (gastroesophageal reflux disease)    Neuromuscular disorder (HCC)    Panic disorder    PONV (postoperative nausea and vomiting)    chills and anxiety after surgery   Past Surgical History:  Procedure Laterality Date   ANAL FISSURE REPAIR  1993   APPENDECTOMY  1969   BACK SURGERY  2009   Stenosis, sciatica    BACK SURGERY  2011   Stenosis, sciatica   BACK SURGERY  December 2014   CERVICAL SPINE SURGERY  Feb. 2014   CERVICAL SPINE SURGERY  07/23/15   fusion   COLONOSCOPY     KNEE SURGERY     2x- cartilage   left foot surgery  1985   Left shoulder surgery  1990   Right shoulder surgery  2012   TOTAL KNEE ARTHROPLASTY  January 2011   TOTAL KNEE ARTHROPLASTY  Feb. 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  He has been seeing an ortho PT Cherylene Corrente, went to [place] for 2 weeks and did a bus tour, it was demanding.  Since he has got back, his back and hamstring is feeling better.  He was taking Fibercon before but Metamucil is better. He is taking 2 teaspoons every morning.  He reports that he has a little bit of spotting, it is so much better. Spotting once/ day, maybe not at all. Not so much chafing anymore.  Bristol stool scale type 4 for the most part, sometimes type 5. Pt reports that he can't sleep at night without ambien and he is on an antidepressant. He is wondering if ling term use of antidepressants can affect his diarrhea.  Pt feel much better now.    (From Eval) Pt states that he had a colonoscopy, everything was good, has leakage between bowel movements, pt not quite sure why he is in PT. Was taking imodium for symptom relief, it was working pretty well. Pt not sure if that would be a good long term solution. Tried squatty potty, helped some, still some leakage. Pt tried Miralax.  Leakage started a couple of years ago, it was not so bad, used some pads, maybe came from diet. He is not quite sure, some sweets maybe. They travel a lot, he eats ice cream and doughnuts.  Spotting 2-3 times/ day, diarrhea,  sometimes, starts to chafe Tries to stay between 190- 195 lbs   Fluid intake: diet sodas a lot-3-4, water  PAIN:  Are you having pain? No- no stomach, rectum  PRECAUTIONS: None  RED FLAGS: None   WEIGHT BEARING RESTRICTIONS: No  FALLS:  Has patient fallen in last 6 months? 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  Not doing anything now Fully empty rectum: Yes: squatty potty helps Leakage: Yes: 2-3 times/ day Pads: Yes:   Fiber supplement: No  URINATION: had a urolift 3 years ago Pain with urination: Noafter urolift Fully empty bladder: Yes:   Stream: Strong Urgency: No Frequency: no- sleeps all night Leakage: none Pads: No  INTERCOURSE: Pain with intercourse: none- frequency less than used to    OBJECTIVE:  Note: Objective measures were completed at Evaluation unless otherwise noted.    PATIENT SURVEYS:    PFIQ-7 10, CRAIQ- 7 10  COGNITION: Overall cognitive status: Within functional limits for tasks assessed     SENSATION: Light touch: Appears intact Proprioception: Appears intact  MUSCLE LENGTH: Hamstrings: tight bilateral 70D    LUMBAR SPECIAL TESTS:  Slump test: Negative    POSTURE: rounded shoulders, forward head, decreased lumbar lordosis, and flexed trunk   PELVIC ALIGNMENT: even  LUMBARAROM/PROM: stiffness throughout- seeing PT at [facility] [facility]   LOWER EXTREMITY AROM/PROM: limited throughout   LOWER EXTREMITY MMT: 4/5  throughout   PALPATION:               External Perineal Exam  within functional limitations               Internal Pelvic Floor mild weakness and decreased coordination with contraction EAS, IAS, difficulty with bulging/ lengthening, took a lot of effort Patient confirms identification and approves PT to assess internal pelvic floor and treatment Yes  PELVIC MMT:   MMT eval  Internal Anal Sphincter 3/5  External Anal Sphincter 3/5  Puborectalis   Diastasis Recti 1-2 fingers at and above umbilicus  (Blank rows = not tested)  TONE: low  TODAY'S TREATMENT:                                                                                                                              DATE: 05/02/24  Neuro reed- ball press with transverse abdominis breath and pelvic floor contraction                    Chest press with #10 kb 15 reps with pelvic floor contraction and transverse abdominis breath  There acts- reviewed progress and goals                    Education on fiber, relevant anatomy, constipation, strategies to reduce constipation                    Reviewed ortho core exercises     EVAL see below   PATIENT EDUCATION:  Education details: relevant anatomy, exam findings, expectation of PF, HEP Person educated: Patient Education method: Explanation, Demonstration, Tactile cues, Verbal cues, and Handouts Education comprehension: verbalized understanding, returned demonstration, verbal cues required, tactile cues required, and needs further education  HOME EXERCISE PROGRAM: Access Code: [email or web address] URL: [email or web address] Date: 05/02/2024 Prepared by: Jameson Mcburney Kolby Myung  Exercises - Supine Diaphragmatic Breathing  - 1 x daily - 7 x weekly - 3 sets - 10 reps - Supine Bridge with Pelvic Floor Contraction  - 1 x daily - 7 x weekly - 3 sets - 10 reps - Seated Abdominal Press into [employer]  - 1 x daily - 7 x weekly - 3 sets - 10 reps - Rowing with Pelvic Floor  Contraction  - 1 x daily - 7 x weekly - 3 sets - 10 reps - Seated Isometric Hip Adduction with Pelvic Floor Contraction  - 1 x daily - 7 x weekly - 3 sets - 10 reps - Horizontal abd with TB with TRA breath  - 1 x daily - 7 x weekly - 3 sets - 10 reps  ASSESSMENT:  CLINICAL IMPRESSION: Pt returning to PT after about a month, he is feeling better,taking Metamucil for his diarrhea. Has much less fecal smearing, has bristol stool scale type 4-5. Still some smearing present. Has been going to ortho PT and was able to add breathing techniques to his exercises today activate pelvic floor for improved awareness and coordination. Will benefit from cont PT to address deficits.     (  From eval) Patient is a 78 y.o. M who was seen today for physical therapy evaluation and treatment for constipation. Pt with difficulty contracting and relaxing anal sphincters, decreased strength and coordination, increased effort needed to lengthen pelvic floor. Pt complained more of diarrhea and was agreeable to trial of fiber ( metamucil) to bulk up stool.Discussed use of Desitin cream to decrease chafing and cont use of squatty potty. Pt drinks several diet sodas a day, will try a 2 week water only reset to see if this helps with diarrhea. He reported that sugar can irritate his gut. He is stiff throughout his trunk, has diastasis rectus and umbilical hernia and will benefit from pelvic PT to reduce fecal accidents, optimize stool consistency to [facility] stool scale 3-4 and strengthen and coordinate his pelvic floor with his core and breathing.   OBJECTIVE IMPAIRMENTS: decreased coordination, decreased knowledge of condition, decreased ROM, decreased strength, increased fascial restrictions, increased muscle spasms, impaired tone, and pain.   ACTIVITY LIMITATIONS: continence and toileting  PARTICIPATION LIMITATIONS: driving and community activity  PERSONAL FACTORS: Time since onset of injury/illness/exacerbation are also  affecting patient's functional outcome.   REHAB POTENTIAL: Good  CLINICAL DECISION MAKING: Evolving/moderate complexity  EVALUATION COMPLEXITY: Moderate   GOALS: Goals reviewed with patient? Yes  SHORT TERM GOALS: Target date: 06/02/2024   Pt will be independent with HEP.   Baseline: Goal status: INITIAL  2.  Pt will be independent with use of squatty potty, relaxed toileting mechanics, and improved bowel movement techniques in order to increase ease of bowel movements and complete evacuation.   Baseline:  Goal status: progressing  LONG TERM GOALS: Target date: 07/25/2024    Pt will be independent with advanced HEP.   Baseline:  Goal status: INITIAL  2.  Pt will have Bristol stool scale type 3-4 to reduce risk of fecal smearing Baseline:  Goal status: INITIAL  3.  Pt will have at least 1 bowel movement/ day Baseline:  Goal status: INITIAL  4.  Pt will have 0 fecal incontinence accidents/ week Baseline:  Goal status: INITIAL   PLAN:  PT FREQUENCY: 1-2x/week  PT DURATION: 12 weeks  PLANNED INTERVENTIONS: 97110-Therapeutic exercises, 97530- Therapeutic activity, 97112- Neuromuscular re-education, 97535- Self Care, 69629- Manual therapy, [phone]- Electrical stimulation (manual), Taping, Dry Needling, Joint mobilization, Joint manipulation, Spinal manipulation, Spinal mobilization, Scar mobilization, Cryotherapy, Moist heat, and Biofeedback  PLAN FOR NEXT SESSION: food/ bowel diary, abdominal massage, review exercises for back, add pelvic floor awareness exercises for strengthening/ coordination, abdominal strengthening   Lanitra Battaglini, PT 06/07/2024, 8:29 AM

## 2024-06-10 ENCOUNTER — Ambulatory Visit: Admitting: Physical Therapy

## 2024-06-10 ENCOUNTER — Encounter: Payer: Self-pay | Admitting: Physical Therapy

## 2024-06-10 DIAGNOSIS — R252 Cramp and spasm: Secondary | ICD-10-CM

## 2024-06-10 DIAGNOSIS — M62830 Muscle spasm of back: Secondary | ICD-10-CM

## 2024-06-10 DIAGNOSIS — M5459 Other low back pain: Secondary | ICD-10-CM | POA: Diagnosis not present

## 2024-06-10 DIAGNOSIS — M6281 Muscle weakness (generalized): Secondary | ICD-10-CM

## 2024-06-10 DIAGNOSIS — M542 Cervicalgia: Secondary | ICD-10-CM

## 2024-06-10 NOTE — Therapy (Signed)
 OUTPATIENT PHYSICAL THERAPY CERVICAL/LUMBAR TREATMENT    Patient Name: George Freeman MRN: [id number] DOB:03-16-1946, 78 y.o., male Today's Date: 06/10/2024  END OF SESSION:  PT End of Session - 06/10/24 0936     Visit Number 9    Number of Visits 13    Date for PT Re-Evaluation 07/25/24    PT Start Time 0925    PT Stop Time 1010    PT Time Calculation (min) 45 min    Activity Tolerance Patient tolerated treatment well    Behavior During Therapy WFL for tasks assessed/performed          Past Medical History:  Diagnosis Date   Anxiety    Arthritis    BPV (benign positional vertigo)    Depression    GERD (gastroesophageal reflux disease)    Neuromuscular disorder (HCC)    Panic disorder    PONV (postoperative nausea and vomiting)    chills and anxiety after surgery   Past Surgical History:  Procedure Laterality Date   ANAL FISSURE REPAIR  1993   APPENDECTOMY  1969   BACK SURGERY  2009   Stenosis, sciatica    BACK SURGERY  2011   Stenosis, sciatica   BACK SURGERY  December 2014   CERVICAL SPINE SURGERY  Feb. 2014   CERVICAL SPINE SURGERY  07/23/15   fusion   COLONOSCOPY     KNEE SURGERY     2x- cartilage   left foot surgery  1985   Left shoulder surgery  1990   Right shoulder surgery  2012   TOTAL KNEE ARTHROPLASTY  January 2011   TOTAL KNEE ARTHROPLASTY  Feb. 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 Yes: NPRS scale: 3/10 Pain location: neck right side and right shoulder, low back  Pain description: ache, numbness, tingling Aggravating factors: turn head to the right and look up , walkingpain up to 8/10 Relieving factors: Celebrex  1x/day, not move pain can be 0/10, he is currently trying to not take the pain medicine   PRECAUTIONS: None  WEIGHT BEARING RESTRICTIONS: No  FALLS:  Has patient fallen in last 6 months? No  LIVING ENVIRONMENT: Lives with: lives with their family and lives with their spouse Lives in: House/apartment Stairs: No Has following equipment at home: None  OCCUPATION: retired  PLOF: Independent and minimal pain,   PATIENT GOALS: have less pain  NEXT MD VISIT:   OBJECTIVE:   DIAGNOSTIC FINDINGS:  Cervical spondylosis  COGNITION: Overall cognitive status: Within functional limits for tasks assessed  SENSATION: Right back of head and neck, right upper trap  POSTURE: rounded shoulders and forward head, decreased lordosis  PALPATION: Mild tenderness in the upper trap and the neck, tight in the right neck area, tight in the lumbar area  LUMBAR ROM:  Decreased 50% for flexion, decreased 75% for other motions CERVICAL ROM:   Active ROM A/PROM (deg) eval  Flexion Decreased 25%  Extension Decreased 75%  Right lateral flexion Decreased 75%   Left lateral flexion Decreased 75%  Right rotation Decreased 50%  Left rotation Decreased 50%   (Blank rows = not tested)  UPPER EXTREMITY ROM:  Mild limitations in the shoulders just tight, right shoulder UPPER EXTREMITY MMT:  MMT Right eval Left eval  Shoulder flexion 4- 4  Shoulder extension    Shoulder abduction    Shoulder adduction    Shoulder extension    Shoulder internal rotation 3+ 4-  Shoulder external rotation 3+ 4-  Middle trapezius    Lower trapezius    Elbow flexion    Elbow extension    Wrist flexion    Wrist extension    Wrist ulnar deviation    Wrist radial deviation    Wrist pronation    Wrist supination    Grip strength     (Blank rows = not tested)  CERVICAL SPECIAL TESTS:  Spurling's test: Positive Very tight HS and piriformis ODI= 23/50 = 56% BERG 43/56  TODAY'S TREATMENT:                                                                                                                              DATE:  06/10/24 Nustep level 5 x 6 minutes Gait outside around the back building cues for posture 25# HS curls 2x15 5# leg extension 2x15 20# rows 20# lats 15# AR press Feet on ball K2C, rotation, bridge, isometric abs [employer]  over the gym exercises and reviewed his pelvic floor mm exercises STM to the left HS  06/06/24 Nustep level 5 x 7 minutes Leg curls 20# 2x15 Leg extension 5# 2x10 10# straight arm pulls LEg press 20# 2x10 Seated rows 20# Lats 20# Black tband seated extension 9# farmer carry Feet on ball K2C, rotation, small bridge, isometric abs LE stretches  06/04/24 Nustep level 5 x 6 minutes Leg curls 20# 2x15 10# straight arm pulls 15# rows 20# lats 20# leg press Feet on ball K2C, rotation, bridge and isometric abs Passive stretch LE's  05/14/24 Gait outside with his hiking sticks, worked on sequencing, height adjustments and how to use one vs two Nustep level 5 x 5 minutes Feet on ball K2C, rotation, small bridge,  isometric abs Passive stretch LE's STM to the left HS Education on self stretch and self care while on this trip   05/13/24 Nustep level 4 x 6 minutes Walk outside around the back building brisk pace Leg curls 20# 2x10 Leg extension 5# 2x10 10# straight arm pulls 20# rows 20# lats Feet on ball K2C, rotation, small bridge, isometric abs LE stretches   05/07/24 Nustep level 5 x 6 mintues 15# HS curls 2x10 Leg extension 5# 2x10 10# straight arm pulls 20# leg press 2x10 15# AR press 2x10 20# lats 2x10 Back to wall butt touches and scapular touches Red tband horizontal abduction Stretch of HS and piriformis STM to the left HS  05/06/24 Nustep level 5 x 6 minutes 15# HS curls 2x10 5# leg extension 25# rows 25# lats 20# leg press Feet on ball K2C, rotation, bridge, isometric abs Passive stretch LE's STM with the Tgun to the left HS  04/29/24 Bike level 5 x 6 minutes 25# lats 25# row 20# AR press 10# straight arm pulls Gait outside around the back building he started having some left HS pain up to 3/10 coming up the hill Passive stretch STM to the HS  04/22/24 Bike level 3 x 5 minutes 25# lats 25# rows 20# AR press Passive stretch LE's STM to the right HS  04/04/24 Seated rows 25# 2x10 Lats 25# 2x10 10# straight arm pulls 2x10 Leg press 40# 2x10 Leg curls 25# 2x10 Leg extension 5# 2x10 AR press 20# 2x10 5# hip extension 5# hip abduction 10# biceps 25# triceps Red tband back to wall ER and horizontal abduction Feet on ball K2C, bridge, isometric abs  04/02/24 Evaluation  PATIENT EDUCATION:  Education details: POC and HEP Person educated: Patient Education method: Programmer, multimedia, Facilities manager, Verbal cues, and Handouts Education comprehension: verbalized understanding  HOME EXERCISE PROGRAM: Access Code: [email or web address] URL: [email or web address] Date: 04/02/24 Prepared by: Cherylene Corrente  Exercises - Standing Cervical Retraction  - 1 x  daily - 7 x weekly - 2 sets - 10 reps - 3 hold - Standing Cervical Retraction with Sidebending  - 1 x daily - 7 x weekly - 2 sets - 10 reps - 3 hold - Seated Scapular Retraction  - 1 x daily - 7 x weekly - 2 sets - 10 reps - 3 hold - Seated Shoulder Shrugs  - 1 x daily - 7 x weekly - 2 sets - 10 reps - 3 hold  ASSESSMENT:  CLINICAL IMPRESSION: Patient reports that he has increased his walking and has been able to increase up to 2 miles,  Does have fatigue and need to rest will also have back pain increase.  He feels the HS when going up hill.  Overall feels  he is doing better reports probably would not do the gym here.  But wants to have some written paper on the exercises and the weights.  Patient is a 78 y.o. male who was seen today for physical therapy evaluation and treatment for neck pain with some radiculopathy, back pain and some balance issues.  .  Had a one level fusion in 2019, MD diagnosis is spondylosis.  Berg balance 43/56, very limited cervical and lumbar ROM, poor core  OBJECTIVE IMPAIRMENTS: decreased ROM, decreased strength, dizziness, increased muscle spasms, impaired flexibility, postural dysfunction, and pain.   REHAB POTENTIAL: Good  CLINICAL DECISION MAKING: Stable/uncomplicated  EVALUATION COMPLEXITY: Low   GOALS: Goals reviewed with patient? Yes  SHORT TERM GOALS: Target date: 05/02/24  Independent with initial HEP Goal status: met 04/22/24  LONG TERM GOALS: Target date: 07/02/24  Understand posture and body mechanics Goal status: progressing 05/14/24  2.  Have a good ergonomic set up for computer and TV use Goal status: met 05/07/24  3.  Increase cervical ROM 25% Goal status: met 05/14/24  4.  Decrease pain 25% Goal status: progressing 06/04/24  5.  Berg balance test to 48/56 Goal status: progressing 06/04/24   PLAN:  PT FREQUENCY: 1-2x/week  PT DURATION: 12 weeks  PLANNED INTERVENTIONS: Therapeutic exercises, Therapeutic activity, Neuromuscular  re-education, Balance training, Gait training, Patient/Family education, Self Care, Joint mobilization, Vestibular training, Canalith repositioning, Visual/preceptual remediation/compensation, Dry Needling, Electrical stimulation, Spinal mobilization, Moist heat, Traction, Ultrasound, and Manual therapy  PLAN FOR NEXT SESSION:  may D/C next visit go over gym activities at his place [place], PT 06/10/2024, 9:43 AM

## 2024-06-13 ENCOUNTER — Ambulatory Visit: Admitting: Physical Therapy

## 2024-06-13 ENCOUNTER — Encounter: Payer: Self-pay | Admitting: Physical Therapy

## 2024-06-13 DIAGNOSIS — M62830 Muscle spasm of back: Secondary | ICD-10-CM

## 2024-06-13 DIAGNOSIS — M6281 Muscle weakness (generalized): Secondary | ICD-10-CM

## 2024-06-13 DIAGNOSIS — M5459 Other low back pain: Secondary | ICD-10-CM

## 2024-06-13 DIAGNOSIS — M542 Cervicalgia: Secondary | ICD-10-CM

## 2024-06-13 DIAGNOSIS — R252 Cramp and spasm: Secondary | ICD-10-CM

## 2024-06-13 NOTE — Therapy (Signed)
 OUTPATIENT PHYSICAL THERAPY CERVICAL/LUMBAR TREATMENT    Patient Name: George Freeman MRN: [id number] DOB:1946/11/02, 78 y.o., male Today's Date: 06/13/2024  END OF SESSION:  PT End of Session - 06/13/24 1054     Visit Number 10    Number of Visits 13    Date for PT Re-Evaluation 07/25/24    PT Start Time 1053    PT Stop Time 1142    PT Time Calculation (min) 49 min    Activity Tolerance Patient tolerated treatment well    Behavior During Therapy WFL for tasks assessed/performed          Past Medical History:  Diagnosis Date   Anxiety    Arthritis    BPV (benign positional vertigo)    Depression    GERD (gastroesophageal reflux disease)    Neuromuscular disorder (HCC)    Panic disorder    PONV (postoperative nausea and vomiting)    chills and anxiety after surgery   Past Surgical History:  Procedure Laterality Date   ANAL FISSURE REPAIR  1993   APPENDECTOMY  1969   BACK SURGERY  2009   Stenosis, sciatica    BACK SURGERY  2011   Stenosis, sciatica   BACK SURGERY  December 2014   CERVICAL SPINE SURGERY  Feb. 2014   CERVICAL SPINE SURGERY  07/23/15   fusion   COLONOSCOPY     KNEE SURGERY     2x- cartilage   left foot surgery  1985   Left shoulder surgery  1990   Right shoulder surgery  2012   TOTAL KNEE ARTHROPLASTY  January 2011   TOTAL KNEE ARTHROPLASTY  Feb. 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 Yes: NPRS scale: 3/10 Pain location: neck right side and right shoulder, low back  Pain description: ache, numbness, tingling Aggravating factors: turn head to the right and look up , walkingpain up to 8/10 Relieving factors: Celebrex  1x/day, not move pain can be 0/10, he is currently trying to not take the pain medicine   PRECAUTIONS: None  WEIGHT BEARING RESTRICTIONS: No  FALLS:  Has patient fallen in last 6 months? No  LIVING ENVIRONMENT: Lives with: lives with their family and lives with their spouse Lives in: House/apartment Stairs: No Has following equipment at home: None  OCCUPATION: retired  PLOF: Independent and minimal pain,   PATIENT GOALS: have less pain  NEXT MD VISIT:   OBJECTIVE:   DIAGNOSTIC FINDINGS:  Cervical spondylosis  COGNITION: Overall cognitive status: Within functional limits for tasks assessed  SENSATION: Right back of head and neck, right upper trap  POSTURE: rounded shoulders and forward head, decreased lordosis  PALPATION: Mild tenderness in the upper trap and the neck, tight in the right neck area, tight in the lumbar area  LUMBAR ROM:  Decreased 50% for flexion, decreased 75% for other motions CERVICAL ROM:   Active ROM A/PROM (deg) eval  Flexion Decreased 25%  Extension Decreased 75%  Right lateral flexion Decreased 75%  Left lateral flexion Decreased 75%  Right rotation Decreased  50%  Left rotation Decreased 50%   (Blank rows = not tested)  UPPER EXTREMITY ROM:  Mild limitations in the shoulders just tight, right shoulder UPPER EXTREMITY MMT:  MMT Right eval Left eval  Shoulder flexion 4- 4  Shoulder extension    Shoulder abduction    Shoulder adduction    Shoulder extension    Shoulder internal rotation 3+ 4-  Shoulder external rotation 3+ 4-  Middle trapezius    Lower trapezius    Elbow flexion    Elbow extension    Wrist flexion    Wrist extension    Wrist ulnar deviation    Wrist radial deviation    Wrist pronation    Wrist supination    Grip strength     (Blank rows = not tested)  CERVICAL SPECIAL TESTS:  Spurling's test: Positive Very tight HS and piriformis ODI= 23/50 = 56% BERG 43/56  TODAY'S TREATMENT:                                                                                                                              DATE:  06/13/24 Nustep level 5 x 6 minutes Leg curls 25# Leg extension 5# 25# rows 25# lats 15# bench Reviewed all of the above and gave handout for weight, reps and sets and talked about form and safety. Went over walking program and stretches  06/10/24 Nustep level 5 x  6 minutes Gait outside around the back building cues for posture 25# HS curls 2x15 5# leg extension 2x15 20# rows 20# lats 15# AR press Feet on ball K2C, rotation, bridge, isometric abs Went over the gym exercises and reviewed his pelvic floor mm exercises STM to the left HS  06/06/24 Nustep level 5 x 7 minutes Leg curls 20# 2x15 Leg extension 5# 2x10 10# straight arm pulls LEg press 20# 2x10 Seated rows 20# Lats 20# Black tband seated extension 9# farmer carry Feet on ball K2C, rotation, small bridge, isometric abs LE stretches  06/04/24 Nustep level 5 x 6 minutes Leg curls 20# 2x15 10# straight arm pulls 15# rows 20# lats 20# leg press Feet on ball K2C, rotation, bridge and isometric abs Passive stretch  LE's  05/14/24 Gait outside with his hiking sticks, worked on sequencing, height adjustments and how to use one vs two Nustep level 5 x 5 minutes Feet on ball K2C, rotation, small bridge, isometric abs Passive stretch LE's STM to the left HS Education on self stretch and self care while on this trip   05/13/24 Nustep level 4 x 6 minutes Walk outside around the back building brisk pace Leg curls 20# 2x10 Leg extension 5# 2x10 10# straight arm pulls 20# rows 20# lats Feet on ball K2C, rotation, small bridge, isometric abs LE stretches   05/07/24 Nustep level 5 x 6 mintues 15# HS curls 2x10 Leg extension 5# 2x10 10# straight arm pulls 20# leg press 2x10 15# AR press 2x10 20# lats 2x10 Back to wall butt touches and scapular touches Red tband horizontal abduction Stretch of HS and piriformis STM to the left HS  05/06/24 Nustep level 5 x 6 minutes 15# HS curls 2x10 5# leg extension 25# rows 25# lats 20# leg press Feet on ball K2C, rotation, bridge, isometric abs Passive stretch LE's STM with the Tgun to the left HS  04/29/24 Bike level 5 x 6 minutes 25# lats 25# row 20# AR press 10# straight arm pulls Gait outside around the back building he started having some left HS pain up to 3/10 coming up the hill Passive stretch STM to the HS  04/22/24 Bike level 3 x 5 minutes 25# lats 25# rows 20# AR press Passive stretch LE's STM to the right HS  04/04/24 Seated rows 25# 2x10 Lats 25# 2x10 10# straight arm pulls 2x10 Leg press 40# 2x10 Leg curls 25# 2x10 Leg extension 5# 2x10 AR press 20# 2x10 5# hip extension 5# hip abduction 10# biceps 25# triceps Red tband back to wall ER and horizontal abduction Feet on ball K2C, bridge, isometric abs  04/02/24 Evaluation  PATIENT EDUCATION:  Education details: POC and HEP Person educated: Patient Education method: Programmer, multimedia, Facilities manager, Verbal cues, and Handouts Education comprehension: verbalized  understanding  HOME EXERCISE PROGRAM: Access Code: [email or web address] URL: [email or web address] Date: 04/02/24 Prepared by: Cherylene Corrente  Exercises - Standing Cervical Retraction  - 1 x daily - 7 x weekly - 2 sets - 10 reps - 3 hold - Standing Cervical Retraction with Sidebending  - 1 x daily - 7 x weekly - 2 sets - 10 reps - 3 hold - Seated Scapular Retraction  - 1 x daily - 7 x weekly - 2 sets - 10 reps - 3 hold - Seated Shoulder Shrugs  - 1 x daily - 7 x weekly - 2 sets - 10 reps - 3 hold  ASSESSMENT:  CLINICAL IMPRESSION: Patient doing well, today went over the gym activities,  went of form and posture, weights, sets, reps how to adjust and when and how to add weight vs reps and sets.  We went over posture and body mechanics for ADL's, walking program and stretches, answered his questions  Patient is a 78 y.o. male who was seen today for physical therapy evaluation and treatment for neck pain with some radiculopathy, back pain and some balance issues.  .  Had a one level fusion in 2019, MD diagnosis is spondylosis.  Berg balance 43/56, very limited cervical and lumbar ROM, poor core  OBJECTIVE IMPAIRMENTS: decreased ROM, decreased strength, dizziness, increased muscle spasms, impaired flexibility, postural dysfunction, and pain.   REHAB POTENTIAL: Good  CLINICAL DECISION MAKING: Stable/uncomplicated  EVALUATION COMPLEXITY: Low   GOALS: Goals reviewed with patient? Yes  SHORT TERM GOALS: Target date: 05/02/24  Independent with initial HEP Goal status: met 04/22/24  LONG TERM GOALS: Target date: 07/02/24  Understand posture and body mechanics Goal status: met 06/13/24  2.  Have a good ergonomic set up for computer and TV use Goal status: met 05/07/24  3.  Increase cervical ROM 25% Goal status: met 05/14/24  4.  Decrease pain 25% Goal status: met 06/13/24  5.  Berg balance test to 48/56 Goal status: met 06/13/24   PLAN:  PT FREQUENCY: 1-2x/week  PT DURATION:  12 weeks  PLANNED INTERVENTIONS: Therapeutic exercises, Therapeutic activity, Neuromuscular re-education, Balance training, Gait training, Patient/Family education, Self Care, Joint mobilization, Vestibular training, Canalith repositioning, Visual/preceptual remediation/compensation, Dry Needling, Electrical stimulation, Spinal mobilization, Moist heat, Traction, Ultrasound, and Manual therapy  PLAN FOR NEXT SESSION:  D/C goals met Hollis Lurie, PT 06/13/2024, 10:55 AM

## 2024-06-14 ENCOUNTER — Encounter: Admitting: Physical Therapy

## 2024-06-17 ENCOUNTER — Encounter: Admitting: Physical Therapy

## 2024-06-24 ENCOUNTER — Encounter: Admitting: Physical Therapy

## 2024-07-02 ENCOUNTER — Encounter: Admitting: Physical Therapy

## 2024-10-28 ENCOUNTER — Encounter: Payer: Self-pay | Admitting: Radiology

## 2025-01-31 ENCOUNTER — Other Ambulatory Visit: Payer: Self-pay | Admitting: Orthopedic Surgery
